# Patient Record
Sex: Female | Born: 1965 | Race: Black or African American | Hispanic: No | Marital: Single | Smoking: Current every day smoker
Health system: Southern US, Community
[De-identification: ages and names within clinical notes are randomized; demographics above are authoritative.]

## PROBLEM LIST (undated history)

## (undated) DIAGNOSIS — F419 Anxiety disorder, unspecified: Secondary | ICD-10-CM

## (undated) DIAGNOSIS — R269 Unspecified abnormalities of gait and mobility: Principal | ICD-10-CM

## (undated) DIAGNOSIS — F32A Depression, unspecified: Secondary | ICD-10-CM

## (undated) DIAGNOSIS — J40 Bronchitis, not specified as acute or chronic: Secondary | ICD-10-CM

## (undated) DIAGNOSIS — E785 Hyperlipidemia, unspecified: Secondary | ICD-10-CM

## (undated) DIAGNOSIS — I10 Essential (primary) hypertension: Secondary | ICD-10-CM

## (undated) DIAGNOSIS — F329 Major depressive disorder, single episode, unspecified: Secondary | ICD-10-CM

## (undated) DIAGNOSIS — K219 Gastro-esophageal reflux disease without esophagitis: Secondary | ICD-10-CM

## (undated) HISTORY — PX: TUBAL LIGATION: SHX77

## (undated) HISTORY — DX: Anxiety disorder, unspecified: F41.9

## (undated) HISTORY — DX: Unspecified abnormalities of gait and mobility: R26.9

## (undated) HISTORY — DX: Depression, unspecified: F32.A

## (undated) HISTORY — DX: Major depressive disorder, single episode, unspecified: F32.9

## (undated) HISTORY — DX: Hyperlipidemia, unspecified: E78.5

---

## 1997-05-15 ENCOUNTER — Other Ambulatory Visit: Admission: RE | Admit: 1997-05-15 | Discharge: 1997-05-15 | Payer: Self-pay | Admitting: Family Medicine

## 1998-06-11 ENCOUNTER — Inpatient Hospital Stay (HOSPITAL_COMMUNITY): Admission: AD | Admit: 1998-06-11 | Discharge: 1998-06-11 | Payer: Self-pay | Admitting: Obstetrics & Gynecology

## 1998-09-25 ENCOUNTER — Encounter (INDEPENDENT_AMBULATORY_CARE_PROVIDER_SITE_OTHER): Payer: Self-pay

## 1998-09-25 ENCOUNTER — Inpatient Hospital Stay (HOSPITAL_COMMUNITY): Admission: AD | Admit: 1998-09-25 | Discharge: 1998-09-29 | Payer: Self-pay | Admitting: Obstetrics

## 1998-09-25 ENCOUNTER — Encounter: Payer: Self-pay | Admitting: Obstetrics

## 1998-10-28 ENCOUNTER — Encounter: Admission: RE | Admit: 1998-10-28 | Discharge: 1998-10-28 | Payer: Self-pay | Admitting: Obstetrics

## 1998-11-04 ENCOUNTER — Encounter: Admission: RE | Admit: 1998-11-04 | Discharge: 1998-11-04 | Payer: Self-pay | Admitting: Obstetrics

## 1998-11-10 ENCOUNTER — Encounter: Admission: RE | Admit: 1998-11-10 | Discharge: 1998-11-10 | Payer: Self-pay | Admitting: Obstetrics & Gynecology

## 1998-11-24 ENCOUNTER — Encounter: Admission: RE | Admit: 1998-11-24 | Discharge: 1998-11-24 | Payer: Self-pay | Admitting: Obstetrics & Gynecology

## 1998-11-29 ENCOUNTER — Encounter: Admission: RE | Admit: 1998-11-29 | Discharge: 1998-11-29 | Payer: Self-pay | Admitting: Obstetrics & Gynecology

## 1998-12-08 ENCOUNTER — Ambulatory Visit (HOSPITAL_COMMUNITY): Admission: RE | Admit: 1998-12-08 | Discharge: 1998-12-08 | Payer: Self-pay | Admitting: Obstetrics

## 1998-12-09 ENCOUNTER — Encounter: Admission: RE | Admit: 1998-12-09 | Discharge: 1998-12-09 | Payer: Self-pay | Admitting: Obstetrics

## 1998-12-23 ENCOUNTER — Encounter (HOSPITAL_COMMUNITY): Admission: RE | Admit: 1998-12-23 | Discharge: 1999-01-19 | Payer: Self-pay | Admitting: Obstetrics

## 1998-12-23 ENCOUNTER — Encounter: Admission: RE | Admit: 1998-12-23 | Discharge: 1998-12-23 | Payer: Self-pay | Admitting: Obstetrics

## 1999-01-13 ENCOUNTER — Encounter: Admission: RE | Admit: 1999-01-13 | Discharge: 1999-01-13 | Payer: Self-pay | Admitting: Obstetrics

## 1999-01-17 ENCOUNTER — Inpatient Hospital Stay (HOSPITAL_COMMUNITY): Admission: AD | Admit: 1999-01-17 | Discharge: 1999-01-21 | Payer: Self-pay | Admitting: *Deleted

## 1999-01-17 ENCOUNTER — Encounter (INDEPENDENT_AMBULATORY_CARE_PROVIDER_SITE_OTHER): Payer: Self-pay

## 2003-06-11 ENCOUNTER — Other Ambulatory Visit: Admission: RE | Admit: 2003-06-11 | Discharge: 2003-06-11 | Payer: Self-pay | Admitting: Family Medicine

## 2003-06-15 ENCOUNTER — Encounter: Admission: RE | Admit: 2003-06-15 | Discharge: 2003-06-15 | Payer: Self-pay | Admitting: Family Medicine

## 2003-06-17 ENCOUNTER — Encounter: Admission: RE | Admit: 2003-06-17 | Discharge: 2003-06-17 | Payer: Self-pay | Admitting: Family Medicine

## 2003-10-05 ENCOUNTER — Encounter: Admission: RE | Admit: 2003-10-05 | Discharge: 2003-10-05 | Payer: Self-pay | Admitting: Family Medicine

## 2003-10-05 ENCOUNTER — Encounter (INDEPENDENT_AMBULATORY_CARE_PROVIDER_SITE_OTHER): Payer: Self-pay | Admitting: *Deleted

## 2003-10-14 ENCOUNTER — Encounter (HOSPITAL_COMMUNITY): Admission: RE | Admit: 2003-10-14 | Discharge: 2003-11-04 | Payer: Self-pay | Admitting: General Surgery

## 2003-11-02 ENCOUNTER — Ambulatory Visit (HOSPITAL_COMMUNITY): Admission: RE | Admit: 2003-11-02 | Discharge: 2003-11-02 | Payer: Self-pay | Admitting: General Surgery

## 2003-11-02 ENCOUNTER — Encounter: Admission: RE | Admit: 2003-11-02 | Discharge: 2003-11-02 | Payer: Self-pay | Admitting: General Surgery

## 2003-11-02 ENCOUNTER — Ambulatory Visit (HOSPITAL_BASED_OUTPATIENT_CLINIC_OR_DEPARTMENT_OTHER): Admission: RE | Admit: 2003-11-02 | Discharge: 2003-11-02 | Payer: Self-pay | Admitting: General Surgery

## 2003-11-02 ENCOUNTER — Encounter (INDEPENDENT_AMBULATORY_CARE_PROVIDER_SITE_OTHER): Payer: Self-pay | Admitting: *Deleted

## 2003-11-02 HISTORY — PX: BREAST EXCISIONAL BIOPSY: SUR124

## 2003-11-13 ENCOUNTER — Ambulatory Visit: Payer: Self-pay | Admitting: Family Medicine

## 2004-02-05 ENCOUNTER — Ambulatory Visit: Payer: Self-pay | Admitting: Family Medicine

## 2004-06-24 ENCOUNTER — Ambulatory Visit: Payer: Self-pay | Admitting: Family Medicine

## 2004-09-06 ENCOUNTER — Ambulatory Visit: Payer: Self-pay | Admitting: Family Medicine

## 2004-10-28 ENCOUNTER — Ambulatory Visit: Payer: Self-pay | Admitting: Family Medicine

## 2004-11-07 ENCOUNTER — Encounter: Admission: RE | Admit: 2004-11-07 | Discharge: 2004-11-07 | Payer: Self-pay | Admitting: Family Medicine

## 2004-11-23 ENCOUNTER — Ambulatory Visit: Payer: Self-pay | Admitting: Nurse Practitioner

## 2004-11-28 ENCOUNTER — Ambulatory Visit: Payer: Self-pay | Admitting: Family Medicine

## 2005-04-06 ENCOUNTER — Ambulatory Visit: Payer: Self-pay | Admitting: Family Medicine

## 2005-04-21 ENCOUNTER — Ambulatory Visit (HOSPITAL_COMMUNITY): Admission: RE | Admit: 2005-04-21 | Discharge: 2005-04-21 | Payer: Self-pay | Admitting: Family Medicine

## 2005-10-06 ENCOUNTER — Ambulatory Visit: Payer: Self-pay | Admitting: Family Medicine

## 2006-05-02 ENCOUNTER — Emergency Department (HOSPITAL_COMMUNITY): Admission: EM | Admit: 2006-05-02 | Discharge: 2006-05-02 | Payer: Self-pay | Admitting: Emergency Medicine

## 2006-05-25 ENCOUNTER — Ambulatory Visit: Payer: Self-pay | Admitting: Family Medicine

## 2006-05-25 ENCOUNTER — Ambulatory Visit (HOSPITAL_COMMUNITY): Admission: RE | Admit: 2006-05-25 | Discharge: 2006-05-25 | Payer: Self-pay | Admitting: Family Medicine

## 2006-09-14 ENCOUNTER — Ambulatory Visit: Payer: Self-pay | Admitting: Family Medicine

## 2006-11-30 DIAGNOSIS — M65849 Other synovitis and tenosynovitis, unspecified hand: Secondary | ICD-10-CM

## 2006-11-30 DIAGNOSIS — K219 Gastro-esophageal reflux disease without esophagitis: Secondary | ICD-10-CM

## 2006-11-30 DIAGNOSIS — D0590 Unspecified type of carcinoma in situ of unspecified breast: Secondary | ICD-10-CM

## 2006-11-30 DIAGNOSIS — F1410 Cocaine abuse, uncomplicated: Secondary | ICD-10-CM | POA: Insufficient documentation

## 2006-11-30 DIAGNOSIS — F17200 Nicotine dependence, unspecified, uncomplicated: Secondary | ICD-10-CM

## 2006-11-30 DIAGNOSIS — M65839 Other synovitis and tenosynovitis, unspecified forearm: Secondary | ICD-10-CM

## 2007-03-11 ENCOUNTER — Ambulatory Visit: Payer: Self-pay | Admitting: Family Medicine

## 2007-07-17 ENCOUNTER — Ambulatory Visit: Payer: Self-pay | Admitting: Internal Medicine

## 2007-08-27 ENCOUNTER — Encounter: Admission: RE | Admit: 2007-08-27 | Discharge: 2007-08-27 | Payer: Self-pay | Admitting: Family Medicine

## 2007-08-31 ENCOUNTER — Emergency Department (HOSPITAL_COMMUNITY): Admission: EM | Admit: 2007-08-31 | Discharge: 2007-09-01 | Payer: Self-pay | Admitting: Emergency Medicine

## 2007-11-22 ENCOUNTER — Ambulatory Visit (HOSPITAL_COMMUNITY): Admission: RE | Admit: 2007-11-22 | Discharge: 2007-11-22 | Payer: Self-pay | Admitting: Family Medicine

## 2007-11-22 ENCOUNTER — Ambulatory Visit: Payer: Self-pay | Admitting: Family Medicine

## 2008-02-14 ENCOUNTER — Ambulatory Visit: Payer: Self-pay | Admitting: Family Medicine

## 2008-06-01 ENCOUNTER — Emergency Department (HOSPITAL_COMMUNITY): Admission: EM | Admit: 2008-06-01 | Discharge: 2008-06-01 | Payer: Self-pay | Admitting: Emergency Medicine

## 2009-10-06 ENCOUNTER — Ambulatory Visit: Payer: Self-pay | Admitting: Family Medicine

## 2009-10-06 ENCOUNTER — Encounter (INDEPENDENT_AMBULATORY_CARE_PROVIDER_SITE_OTHER): Payer: Self-pay | Admitting: Family Medicine

## 2009-10-06 LAB — CONVERTED CEMR LAB
Basophils Relative: 0 % (ref 0–1)
HCT: 41.7 % (ref 36.0–46.0)
MCV: 76.1 fL — ABNORMAL LOW (ref 78.0–100.0)
Neutro Abs: 5.3 10*3/uL (ref 1.7–7.7)
Neutrophils Relative %: 65 % (ref 43–77)
Platelets: 298 10*3/uL (ref 150–400)

## 2010-02-04 ENCOUNTER — Emergency Department (HOSPITAL_COMMUNITY)
Admission: EM | Admit: 2010-02-04 | Discharge: 2010-02-04 | Payer: Self-pay | Source: Home / Self Care | Admitting: Family Medicine

## 2010-06-24 NOTE — Op Note (Signed)
[facility]  Patient:    Laura Huerta                    MRN: [id number] Proc. Date: 01/19/99 Adm. Date:  [id number] Attending:  Antionette Char Dictator:   Bernette Redbird, M.D. CC:         Conni Elliot, M.D.             Bernette Redbird, M.D.                           Operative Report  HOSPITAL ID NUMBER:           [id number]  PREOPERATIVE DIAGNOSES:       1. A 40-week intrauterine pregnancy.                               2. Arrest of dilatation.                               3. Failure to descend.                               4. Repetitive severe variables.                               5. Multiparity, desires permanent sterilization.  POSTOPERATIVE DIAGNOSES:      1. A 40-week intrauterine pregnancy.                               2. Arrest of dilatation.                               3. Failure to descend.                               4. Repetitive severe variables.                               5. Multiparity, desires permanent sterilization.  OPERATIONS:                   1. Primary low transverse cesarean section via  Pfannenstiel.                               2. Postpartum tubal ligation, Pomeroy method.  SURGEON:                      Conni Elliot, M.D.  ASSISTANT:                    Bernette Redbird, M.D.  ANESTHESIA:                   Epidural.  COMPLICATIONS:                None.  ESTIMATED BLOOD LOSS:         800 cc.  FLUIDS:  2300 cc, crystalloid.  URINE OUTPUT:                 400 cc of clear urine.  INDICATIONS:                  The patient is a 45 year old, gravida 5, para 1-0-5-1 at [redacted] weeks gestation, induced for oligohydramnios, IUGR, repetitive severe variables with oxytocin, maximum dilatation 9 cm with failure to descend.  FINDINGS:                     Female infant in cephalic presentation.  DeLee on the abdomen.  Pediatrics present at delivery.  Apgars 4 and 8.  Cord  gas 7.26. Normal uterus, tubes, and ovaries.  DESCRIPTION OF PROCEDURE:     The patient was taken to the operating room where  epidural anesthesia was found to be adequate.  She was then prepared and draped in a normal sterile fashion in a dorsal supine position with a leftward tilt.  A Pfannenstiel skin incision was then made with a scalpel and carried through to he underlying layer of the fascia with a Bovie.  The fascia was nicked in the midline and the incision extended laterally with the Mayo scissors.  The superior aspect of the fascial incision was then grasped with the Kocher clamps, elevated, and the  underlying rectus muscles dissected off bluntly.  Attention was then turned to he inferior aspect of this incision, which, in a similar fashion was grasped, tinted up with the Kocher clamps, and the rectus muscles dissected off bluntly.  The rectus muscles were then separated in the midline and the peritoneum identified, tinted up, and entered sharply with the Metzenbaum scissors.  The peritoneal incision was then extended superiorly and inferiorly with good visualization of the bladder.  The bladder blade was then inserted and the vesicouterine peritoneum identified, grasped with the pickups and entered sharply with the Metzenbaum scissors.  This incision was then extended laterally and a bladder created digitally.  The bladder blade was then reinserted, the lower uterine segment incised in a transverse fashion with the scalpel.  The uterine incision was then extended laterally with the bandage scissors.  The bladder blade was removed and the infants head delivered atraumatically.  The nose and mouth were suctioned with a DeLee suction tract, and the cord clamped and cut.  The infant was handed off to the awaiting pediatricians.  Cord gases were sent and returned at 7.26.  The placenta was then removed manually, the uterus exteriorized and cleared of ll clots and  debris.  The uterine incision was then repaired with 1-0 chromic in a  running lock fashion.  The patients left fallopian tube was then identified and  grasped with a Babcock clamp and the tube was then followed out to the fimbria.  The Babcock clamp was then used to grasp the tube approximately 4 cm from the corneal region.  A 3 cm segment of the tube was then ligated with a free tie of 2-0 chromic and excised.  Good hemostasis was noted.  The right fallopian tube was hen ligated, and 3 cm segment excised in a similar fashion.  Excellent hemostasis was noted.  The bladder was then filled with sterile milk and noted to be normal. The bladder flap was repaired with 2-0 chromic in a running stitch and then the uterus was returned to the abdomen. The gutter were cleared of all clots, and the peritoneum closed with 2-0 chromic.  The fascia was reapproximated with 0 Vicryl in a running fashion.  The skin was closed with staples.  The patient tolerated the procedure well.  Sponge, lap, and needle counts were correct x 2.  Then, 1 g of cefotetan was given at cord clamp, and will be repeated q.12h. x 24 hours.  The patient was taken to the recovery room in stable condition. DD:  01/19/99 TD:  01/20/99 Job: [id number]

## 2010-06-24 NOTE — Op Note (Signed)
Laura Huerta, Laura Huerta          ACCOUNT NO.:  [id number]   MEDICAL RECORD NO.:  [id number]          PATIENT TYPE:  AMB   LOCATION:  DSC                          FACILITY:  [facility]   PHYSICIAN:  Rose Phi. Maple Hudson, M.D.   DATE OF BIRTH:  April 26, 1965   DATE OF PROCEDURE:  DATE OF DISCHARGE:                                 OPERATIVE REPORT   PREOPERATIVE DIAGNOSIS:  Granular cell tumor of left breast.   POSTOPERATIVE DIAGNOSIS:  Granular cell tumor of left breast.   OPERATION PERFORMED:  Excision of granular cell tumor of left breast.   SURGEON:  Rose Phi. Maple Hudson, M.D.   ANESTHESIA:  MAC.   DESCRIPTION OF PROCEDURE:  The patient was placed on the operating table and  the left breast was prepped and draped in the usual fashion.  Using the  previously placed localization wire as a guide, a curved incision centered  in the upper part of the left breast was outlined.  The area then thoroughly  infiltrated with a local anesthetic mixture.  An incision was then made and  a wide excision of the wire and surrounding tissue was carried out.  Specimen mammography confirmed the removal of the lesion.  Hemostasis was  obtained with the cautery.  Two layer closure of 3-0 Vicryl and subcuticular  4-0  Monocryl with skin glue was then carried out and a dressing applied.  The patient was then transferred to the recovery room in satisfactory  condition having tolerated the procedure well.      Pete   PRY/MEDQ  D:  11/02/2003  T:  11/02/2003  Job:  [id number]

## 2010-10-24 ENCOUNTER — Inpatient Hospital Stay (INDEPENDENT_AMBULATORY_CARE_PROVIDER_SITE_OTHER)
Admission: RE | Admit: 2010-10-24 | Discharge: 2010-10-24 | Disposition: A | Discharge status: Home / Self Care | Payer: Self-pay | Source: Ambulatory Visit | Attending: Emergency Medicine | Admitting: Emergency Medicine

## 2010-10-24 DIAGNOSIS — N760 Acute vaginitis: Secondary | ICD-10-CM

## 2010-10-24 LAB — WET PREP, GENITAL
Trich, Wet Prep: NONE SEEN
Yeast Wet Prep HPF POC: NONE SEEN

## 2010-10-24 LAB — POCT URINALYSIS DIP (DEVICE)
Bilirubin Urine: NEGATIVE
Glucose, UA: NEGATIVE mg/dL
Ketones, ur: NEGATIVE mg/dL
Specific Gravity, Urine: 1.025 (ref 1.005–1.030)
pH: 6.5 (ref 5.0–8.0)

## 2010-10-25 LAB — GC/CHLAMYDIA PROBE AMP, GENITAL: GC Probe Amp, Genital: NEGATIVE

## 2010-11-04 LAB — COMPREHENSIVE METABOLIC PANEL
Albumin: 3.6
Alkaline Phosphatase: 57
CO2: 27
Calcium: 9.5
Creatinine, Ser: 0.74
GFR calc Af Amer: >60

## 2010-11-04 LAB — POCT PREGNANCY, URINE
Operator id: 282151
Preg Test, Ur: NEGATIVE

## 2010-11-04 LAB — POCT URINALYSIS DIP (DEVICE)
Operator id: 282151
Protein, ur: NEGATIVE
Urobilinogen, UA: 1
pH: 6

## 2011-04-06 ENCOUNTER — Encounter (HOSPITAL_COMMUNITY): Payer: Self-pay | Admitting: Emergency Medicine

## 2011-04-06 ENCOUNTER — Emergency Department (HOSPITAL_COMMUNITY)
Admission: EM | Admit: 2011-04-06 | Discharge: 2011-04-06 | Discharge status: Against Medical Advice | Payer: Medicaid Other | Attending: Emergency Medicine | Admitting: Emergency Medicine

## 2011-04-06 ENCOUNTER — Encounter (HOSPITAL_COMMUNITY): Payer: Self-pay | Admitting: *Deleted

## 2011-04-06 ENCOUNTER — Emergency Department (HOSPITAL_COMMUNITY)
Admission: EM | Admit: 2011-04-06 | Discharge: 2011-04-06 | Discharge status: Against Medical Advice | Payer: Medicaid Other | Source: Home / Self Care

## 2011-04-06 DIAGNOSIS — R109 Unspecified abdominal pain: Secondary | ICD-10-CM | POA: Insufficient documentation

## 2011-04-06 HISTORY — DX: Bronchitis, not specified as acute or chronic: J40

## 2011-04-06 HISTORY — DX: Gastro-esophageal reflux disease without esophagitis: K21.9

## 2011-04-06 NOTE — ED Notes (Signed)
Pt in c/o lower abd pain since yesterday with nausea, denies vomiting, states she recently started a medication that interacts with her GERD medication

## 2011-04-06 NOTE — ED Notes (Signed)
C/o lower abd pain since yesterday.  States her PCP started her on Zithromax yesterday for bronchitis.  Pt states she takes Omeprazole and just realized she was not suppose to take Zithromax with Omeprazole.  Reports nausea.

## 2011-04-27 ENCOUNTER — Emergency Department (INDEPENDENT_AMBULATORY_CARE_PROVIDER_SITE_OTHER): Payer: Medicaid Other

## 2011-04-27 ENCOUNTER — Encounter (HOSPITAL_COMMUNITY): Payer: Self-pay | Admitting: Emergency Medicine

## 2011-04-27 ENCOUNTER — Emergency Department (INDEPENDENT_AMBULATORY_CARE_PROVIDER_SITE_OTHER)
Admission: EM | Admit: 2011-04-27 | Discharge: 2011-04-27 | Disposition: A | Discharge status: Home / Self Care | Payer: Self-pay | Source: Home / Self Care | Attending: Family Medicine | Admitting: Family Medicine

## 2011-04-27 DIAGNOSIS — J410 Simple chronic bronchitis: Secondary | ICD-10-CM

## 2011-04-27 DIAGNOSIS — Z720 Tobacco use: Secondary | ICD-10-CM

## 2011-04-27 LAB — POCT URINALYSIS DIP (DEVICE)
Bilirubin Urine: NEGATIVE
Hgb urine dipstick: NEGATIVE
Urobilinogen, UA: 2 mg/dL — ABNORMAL HIGH (ref 0.0–1.0)
pH: 8.5 — ABNORMAL HIGH (ref 5.0–8.0)

## 2011-04-27 MED ORDER — SULFAMETHOXAZOLE-TRIMETHOPRIM 800-160 MG PO TABS: 1.0000 | ORAL_TABLET | Freq: Two times a day (BID) | ORAL | Status: AC

## 2011-04-27 MED ORDER — GUAIFENESIN-CODEINE 100-10 MG/5ML PO SYRP: 10.0000 mL | ORAL_SOLUTION | Freq: Three times a day (TID) | ORAL | Status: AC | PRN

## 2011-04-27 NOTE — ED Notes (Addendum)
Pt HERE WITH COLD/URI SX COUGH WITH YELLOW PHLEGM,RUNNY NOSE THAT STARTED X 1 WEEK AGO S/P FLU LIKE SX.DENIES N./V/FEVERS.PT HAS A BAG FULL OF OTC MEDS SHE'S BEEN TAKING BUT NO RELIEF.PT ALSO C/O VAG IRRITATION,BURN AND ITCHING X 3 DYS AGO.DENIES VAG D/C OR ODOR.LMP 03/23/11.PT HAS HX BRONCHITIS

## 2011-04-27 NOTE — ED Provider Notes (Signed)
History     CSN: [id number]  Arrival date & time 04/27/11  1525   First MD Initiated Contact with Patient 04/27/11 1537      Chief Complaint  Patient presents with  . URI  . Vaginal Itching    (Consider location/radiation/quality/duration/timing/severity/associated sxs/prior treatment) Patient is a 46 y.o. female presenting with URI and vaginal itching. The history is provided by the patient.  URI The primary symptoms include cough. The current episode started 3 to 5 days ago. This is a new problem. The problem has been gradually worsening.  Symptoms associated with the illness include congestion. Risk factors: smokes 2ppd.  Vaginal Itching    Past Medical History  Diagnosis Date  . Acid reflux   . Bronchitis     History reviewed. No pertinent past surgical history.  No family history on file.  History  Substance Use Topics  . Smoking status: Current Everyday Smoker  . Smokeless tobacco: Not on file  . Alcohol Use: Yes    OB History    Grav Para Term Preterm Abortions TAB SAB Ect Mult Living                  Review of Systems  Constitutional: Negative.   HENT: Positive for congestion.   Respiratory: Positive for cough.   Genitourinary: Positive for frequency.    Allergies  Review of patient's allergies indicates no known allergies.  Home Medications   Current Outpatient Rx  Name Route Sig Dispense Refill  . BUPROPION HCL ER (XL) 150 MG PO TB24 Oral Take 150 mg by mouth daily.    Marland Kitchen OMEPRAZOLE 40 MG PO CPDR Oral Take 40 mg by mouth daily.    . GUAIFENESIN-CODEINE 100-10 MG/5ML PO SYRP Oral Take 10 mLs by mouth 3 (three) times daily as needed for cough. 180 mL 0  . SULFAMETHOXAZOLE-TRIMETHOPRIM 800-160 MG PO TABS Oral Take 1 tablet by mouth 2 (two) times daily. 20 tablet 0    BP 179/80  Pulse 80  Temp(Src) 97.5 F (36.4 C) (Oral)  Resp 16  SpO2 99%  LMP 03/23/2011  Physical Exam  Nursing note and vitals reviewed. Constitutional: She is  oriented to person, place, and time. She appears well-developed and well-nourished. No distress.  HENT:  Head: Normocephalic.  Right Ear: External ear normal.  Left Ear: External ear normal.  Mouth/Throat: Oropharynx is clear and moist. Abnormal dentition. Dental caries present.  Eyes: Conjunctivae are normal. Pupils are equal, round, and reactive to light.  Neck: Normal range of motion. Neck supple.       Node to right neck   Cardiovascular: Normal rate and normal heart sounds.   Pulmonary/Chest: She has rhonchi.  Abdominal: Bowel sounds are normal. There is no tenderness.  Lymphadenopathy:    She has cervical adenopathy.  Neurological: She is alert and oriented to person, place, and time.  Skin: Skin is warm and dry.    ED Course  Procedures (including critical care time)  Labs Reviewed  POCT URINALYSIS DIP (DEVICE) - Abnormal; Notable for the following:    pH 8.5 (*)    Urobilinogen, UA 2.0 (*)    All other components within normal limits  POCT PREGNANCY, URINE   Dg Chest 2 View  04/27/2011  *RADIOLOGY REPORT*  Clinical Data: Smoker with cough for 1 year.  CHEST - 2 VIEW  Comparison: 11/22/2007 radiographs.  Findings: The heart size and mediastinal contours are stable.  The lungs are clear.  There is probable apical bleb formation,  best seen on the lateral view, unchanged.  No pleural effusion or pneumothorax is seen.  The osseous structures appear unremarkable.  IMPRESSION: Stable examination.  No acute cardiopulmonary process demonstrated.  Original Report Authenticated By: Gerrianne Scale, M.D.     1. Bronchitis due to tobacco use       MDM  X-rays reviewed and report per radiologist.         Linna Hoff, MD 04/27/11 [phone]

## 2011-05-13 ENCOUNTER — Encounter (HOSPITAL_COMMUNITY): Payer: Self-pay | Admitting: *Deleted

## 2011-05-13 ENCOUNTER — Emergency Department (INDEPENDENT_AMBULATORY_CARE_PROVIDER_SITE_OTHER)
Admission: EM | Admit: 2011-05-13 | Discharge: 2011-05-13 | Disposition: A | Discharge status: Home / Self Care | Payer: Medicaid Other | Source: Home / Self Care | Attending: Emergency Medicine | Admitting: Emergency Medicine

## 2011-05-13 DIAGNOSIS — N926 Irregular menstruation, unspecified: Secondary | ICD-10-CM

## 2011-05-13 DIAGNOSIS — K625 Hemorrhage of anus and rectum: Secondary | ICD-10-CM

## 2011-05-13 DIAGNOSIS — N939 Abnormal uterine and vaginal bleeding, unspecified: Secondary | ICD-10-CM

## 2011-05-13 LAB — POCT PREGNANCY, URINE: Preg Test, Ur: NEGATIVE

## 2011-05-13 NOTE — ED Provider Notes (Signed)
History     CSN: [id number]  Arrival date & time 05/13/11  1700   First MD Initiated Contact with Patient 05/13/11 1706      Chief Complaint  Patient presents with  . Rectal Bleeding    (Consider location/radiation/quality/duration/timing/severity/associated sxs/prior treatment) HPI Comments: Patient presents urgent care today complaining of 2 symptoms both a ongoing recurrent rectal bleeding that has been occurring for several months that have been coming more frequent within the last 2 weeks. This bleeding is not associated with any pain, rectal swelling, or palpable hemorrhoids.  Patient also describes that she just got her period and it was before her normal scheduled expected time and this has happened a couple times her.  Patient has been seen at [id number]  considers this clinic for medical. Patient denies abdominal pain weight loss fevers. Denies having had any evaluations for the symptoms of any other providers  Patient is a 46 y.o. female presenting with hematochezia. The history is provided by the patient.  Rectal Bleeding  The current episode started more than 2 weeks ago. The onset was gradual. The problem occurs occasionally. The problem has been unchanged. The patient is experiencing no pain. The stool is described as mixed with blood. There was no prior unsuccessful therapy. Associated symptoms include vaginal bleeding. Pertinent negatives include no anorexia, no fever, no abdominal pain, no diarrhea, no hematemesis, no hemorrhoids, no nausea, no rectal pain, no vomiting, no hematuria, no vaginal discharge, no chest pain, no coughing, no difficulty breathing and no rash.    Past Medical History  Diagnosis Date  . Acid reflux   . Bronchitis     History reviewed. No pertinent past surgical history.  History reviewed. No pertinent family history.  History  Substance Use Topics  . Smoking status: Current Everyday Smoker  . Smokeless tobacco: Not on file  . Alcohol  Use: Yes    OB History    Grav Para Term Preterm Abortions TAB SAB Ect Mult Living                  Review of Systems  Constitutional: Negative for fever, chills, activity change and appetite change.  Respiratory: Negative for cough.   Cardiovascular: Negative for chest pain.  Gastrointestinal: Positive for hematochezia and anal bleeding. Negative for nausea, vomiting, abdominal pain, diarrhea, rectal pain, anorexia, hematemesis and hemorrhoids.  Genitourinary: Positive for vaginal bleeding. Negative for hematuria and vaginal discharge.  Skin: Negative for rash.  Neurological: Negative for dizziness and light-headedness.    Allergies  Review of patient's allergies indicates no known allergies.  Home Medications   Current Outpatient Rx  Name Route Sig Dispense Refill  . OMEPRAZOLE 40 MG PO CPDR Oral Take 40 mg by mouth daily.    Marland Kitchen PHAZYME PO Oral Take by mouth.    . BUPROPION HCL ER (XL) 150 MG PO TB24 Oral Take 150 mg by mouth daily.      BP 144/80  Pulse 78  Temp(Src) 98.5 F (36.9 C) (Oral)  Resp 17  SpO2 99%  LMP 05/11/2011  Physical Exam  Nursing note and vitals reviewed. Constitutional: She appears well-developed and well-nourished.  Abdominal: Soft. She exhibits no shifting dullness. There is hepatomegaly. There is no hepatosplenomegaly. There is no tenderness. There is no rigidity, no guarding, no tenderness at McBurney's point and negative Murphy's sign. No hernia.    Genitourinary:    There is no tenderness on the right labia. There is no tenderness on the left labia. There is  bleeding around the vagina.    ED Course  Procedures (including critical care time)  Labs Reviewed  WET PREP, GENITAL - Abnormal; Notable for the following:    Clue Cells Wet Prep HPF POC FEW (*)    WBC, Wet Prep HPF POC RARE (*)    All other components within normal limits  OCCULT BLOOD, POC DEVICE  POCT PREGNANCY, URINE  GC/CHLAMYDIA PROBE AMP, GENITAL   No results  found.   1. Rectal bleeding   2. Abnormal menstrual cycle       MDM  Patient presented today with 2 different complaints #1 is an ongoing intermittent and recurrent rectal bleeding she has had for several months has been more frequent within the last 2 weeks and she seen dark looking stools and blood in the toilet. She expresses no pain or this rectal discomfort associated with it. Has not felt any hemorrhoids. She has had similar symptoms for months at a time and has mentioned this to a provider at [facility] as per patient report. She also presents with a recently on schedule menstrual period. Which she describes as before time denies any pelvic pain weight loss fevers or any other symptoms        Jimmie Molly, MD 05/13/11 2042

## 2011-05-13 NOTE — Discharge Instructions (Signed)
As discussed you need to follow both with the gynecologist and also the gastroenterologist. Other many reasons to have rectal and vaginal bleeding there is a workup that needs to be done to rule out rectal cancer or/and endometrial carcinoma which or both malignant conditions that can be screened with  proper imaging and evaluation. I would also suggest you call your doctor at [facility] to assist during this diagnostic process    Abnormal Uterine Bleeding Abnormal uterine bleeding can have many causes. Some cases are simply treated, while others are more serious. There are several kinds of bleeding that is considered abnormal, including:  Bleeding between periods.   Bleeding after sexual intercourse.   Spotting anytime in the menstrual cycle.   Bleeding heavier or more than normal.   Bleeding after menopause.  CAUSES  There are many causes of abnormal uterine bleeding. It can be present in teenagers, pregnant women, women during their reproductive years, and women who have reached menopause. Your caregiver will look for the more common causes depending on your age, signs, symptoms and your particular circumstance. Most cases are not serious and can be treated. Even the more serious causes, like cancer of the female organs, can be treated adequately if found in the early stages. That is why all types of bleeding should be evaluated and treated as soon as possible. DIAGNOSIS  Diagnosing the cause may take several kinds of tests. Your caregiver may:  Take a complete history of the type of bleeding.   Perform a complete physical exam and Pap smear.   Take an ultrasound on the abdomen showing a picture of the female organs and the pelvis.   Inject dye into the uterus and Fallopian tubes and X-ray them (hysterosalpingogram).   Place fluid in the uterus and do an ultrasound (sonohysterogrqphy).   Take a CT scan to examine the female organs and pelvis.   Take an MRI to examine the female  organs and pelvis. There is no X-ray involved with this procedure.   Look inside the uterus with a telescope that has a light at the end (hysteroscopy).   Scrap the inside of the uterus to get tissue to examine (Dilatation and Curettage, D&C).   Look into the pelvis with a telescope that has a light at the end (laparoscopy). This is done through a very small cut (incision) in the abdomen.  TREATMENT  Treatment will depend on the cause of the abnormal bleeding. It can include:  Doing nothing to allow the problem to take care of itself over time.   Hormone treatment.   Birth control pills.   Treating the medical condition causing the problem.   Laparoscopy.   Major or minor surgery   Destroying the lining of the uterus with electrical currant, laser, freezing or heat (uterine ablation).  HOME CARE INSTRUCTIONS   Follow your caregiver's recommendation on how to treat your problem.   See your caregiver if you missed a menstrual period and think you may be pregnant.   If you are bleeding heavily, count the number of pads/tampons you use and how often you have to change them. Tell this to your caregiver.   Avoid sexual intercourse until the problem is controlled.  SEEK MEDICAL CARE IF:   You have any kind of abnormal bleeding mentioned above.   You feel dizzy at times.   You are 46 years old and have not had a menstrual period yet.  SEEK IMMEDIATE MEDICAL CARE IF:   You pass out.  You are changing pads/tampons every 15 to 30 minutes.   You have belly (abdominal) pain.   You have a temperature of 100 F (37.8 C) or higher.   You become sweaty or weak.   You are passing large blood clots from the vagina.   You start to feel sick to your stomach (nauseous) and throw up (vomit).  Document Released: 01/23/2005 Document Revised: 01/12/2011 Document Reviewed: 06/18/2008 [facility] Patient Information 2012 [place], [place].

## 2011-05-13 NOTE — ED Notes (Signed)
Rectal bleeding onset Thursday - dark red blood toilet with each bm - denies rectal pain or itching -denies hemorrhoids -

## 2011-05-15 LAB — GC/CHLAMYDIA PROBE AMP, GENITAL
Chlamydia, DNA Probe: NEGATIVE
GC Probe Amp, Genital: NEGATIVE

## 2011-08-09 ENCOUNTER — Encounter: Payer: Self-pay | Admitting: Obstetrics & Gynecology

## 2011-08-09 ENCOUNTER — Ambulatory Visit (INDEPENDENT_AMBULATORY_CARE_PROVIDER_SITE_OTHER): Payer: Medicaid Other | Admitting: Obstetrics & Gynecology

## 2011-08-09 VITALS — BP 151/103 | HR 76 | Temp 97.6°F | Ht 63.25 in | Wt 142.2 lb

## 2011-08-09 DIAGNOSIS — Z1231 Encounter for screening mammogram for malignant neoplasm of breast: Secondary | ICD-10-CM

## 2011-08-09 DIAGNOSIS — A499 Bacterial infection, unspecified: Secondary | ICD-10-CM

## 2011-08-09 DIAGNOSIS — N951 Menopausal and female climacteric states: Secondary | ICD-10-CM

## 2011-08-09 DIAGNOSIS — N9089 Other specified noninflammatory disorders of vulva and perineum: Secondary | ICD-10-CM

## 2011-08-09 DIAGNOSIS — N760 Acute vaginitis: Secondary | ICD-10-CM

## 2011-08-09 DIAGNOSIS — B9689 Other specified bacterial agents as the cause of diseases classified elsewhere: Secondary | ICD-10-CM

## 2011-08-09 LAB — WET PREP, GENITAL: Yeast Wet Prep HPF POC: NONE SEEN

## 2011-08-09 MED ORDER — VENLAFAXINE HCL ER 75 MG PO CP24: 75.0000 mg | ORAL_CAPSULE | Freq: Every day | ORAL | Status: DC

## 2011-08-09 NOTE — Progress Notes (Signed)
  Subjective:     Laura Huerta is a 46 y.o. woman who presents for amenorrhea x 3 months, debilitating hot flashes and night sweats.  Also reports vulvar irritation off/on with some clear discharge. No other GYN symptoms.  The following portions of the patient's history were reviewed and updated as appropriate: allergies, current medications, past family history, past medical history, past social history, past surgical history and problem list.  Review of Systems Pertinent items are noted in HPI.     Objective:    General appearance: alert and no distress Abdomen: soft, non-tender; bowel sounds normal; no masses,  no organomegaly Pelvic: cervix normal in appearance, external genitalia normal, no adnexal masses or tenderness, no cervical motion tenderness, uterus normal size, shape, and consistency and small amount of white vaginal discharge, wet prep obtained Extremities: extremities normal, atraumatic, no cyanosis or edema and Homans sign is negative, no sign of DVT    Assessment:    The patient has perimenopausal symptoms and vulvar irritation.    Plan:    Follow up wet prep and manage accordingly  Discussed management of perimenopausal symptoms; discussed Effexor. Not an estrogen candidate given uncontrolled HTN (will follow up with PCP). Effexor prescribed. Mammogram scheduled, routine preventative health maintenance measures emphasized. Will do annual exam and pap smear next visit.

## 2011-08-09 NOTE — Patient Instructions (Signed)
Abnormal Uterine Bleeding Abnormal uterine bleeding can have many causes. Some cases are simply treated, while others are more serious. There are several kinds of bleeding that is considered abnormal, including:  Bleeding between periods.   Bleeding after sexual intercourse.   Spotting anytime in the menstrual cycle.   Bleeding heavier or more than normal.   Bleeding after menopause.  CAUSES  There are many causes of abnormal uterine bleeding. It can be present in teenagers, pregnant women, women during their reproductive years, and women who have reached menopause. Your caregiver will look for the more common causes depending on your age, signs, symptoms and your particular circumstance. Most cases are not serious and can be treated. Even the more serious causes, like cancer of the female organs, can be treated adequately if found in the early stages. That is why all types of bleeding should be evaluated and treated as soon as possible. DIAGNOSIS  Diagnosing the cause may take several kinds of tests. Your caregiver may:  Take a complete history of the type of bleeding.   Perform a complete physical exam and Pap smear.   Take an ultrasound on the abdomen showing a picture of the female organs and the pelvis.   Inject dye into the uterus and Fallopian tubes and X-ray them (hysterosalpingogram).   Place fluid in the uterus and do an ultrasound (sonohysterogrqphy).   Take a CT scan to examine the female organs and pelvis.   Take an MRI to examine the female organs and pelvis. There is no X-ray involved with this procedure.   Look inside the uterus with a telescope that has a light at the end (hysteroscopy).   Scrap the inside of the uterus to get tissue to examine (Dilatation and Curettage, D&C).   Look into the pelvis with a telescope that has a light at the end (laparoscopy). This is done through a very small cut (incision) in the abdomen.  TREATMENT  Treatment will depend on the  cause of the abnormal bleeding. It can include:  Doing nothing to allow the problem to take care of itself over time.   Hormone treatment.   Birth control pills.   Treating the medical condition causing the problem.   Laparoscopy.   Major or minor surgery   Destroying the lining of the uterus with electrical currant, laser, freezing or heat (uterine ablation).  HOME CARE INSTRUCTIONS   Follow your caregiver's recommendation on how to treat your problem.   See your caregiver if you missed a menstrual period and think you may be pregnant.   If you are bleeding heavily, count the number of pads/tampons you use and how often you have to change them. Tell this to your caregiver.   Avoid sexual intercourse until the problem is controlled.  SEEK MEDICAL CARE IF:   You have any kind of abnormal bleeding mentioned above.   You feel dizzy at times.   You are 46 years old and have not had a menstrual period yet.  SEEK IMMEDIATE MEDICAL CARE IF:   You pass out.   You are changing pads/tampons every 15 to 30 minutes.   You have belly (abdominal) pain.   You have a temperature of 100 F (37.8 C) or higher.   You become sweaty or weak.   You are passing large blood clots from the vagina.   You start to feel sick to your stomach (nauseous) and throw up (vomit).  Document Released: 01/23/2005 Document Revised: 01/12/2011 Document Reviewed: 06/18/2008 [employer]   Patient Information 2012 [employer]. 

## 2011-08-11 MED ORDER — TINIDAZOLE 500 MG PO TABS: 2.0000 g | ORAL_TABLET | Freq: Every day | ORAL | Status: AC

## 2011-08-11 NOTE — Addendum Note (Signed)
Addended by: Jaynie Collins A on: 08/11/2011 08:54 AM   Modules accepted: Orders

## 2011-08-11 NOTE — Progress Notes (Addendum)
Wet prep showed clue cells, Tinidazole e-prescribed. Patient called to inform her of diagnosis and treatment, and to go pick up the prescription. She was also told to call the clinic with any further questions or further concerns.

## 2011-08-31 ENCOUNTER — Ambulatory Visit (HOSPITAL_COMMUNITY)
Admission: RE | Admit: 2011-08-31 | Discharge: 2011-08-31 | Disposition: A | Discharge status: Home / Self Care | Payer: Medicaid Other | Source: Ambulatory Visit | Attending: Obstetrics & Gynecology | Admitting: Obstetrics & Gynecology

## 2011-08-31 DIAGNOSIS — Z1231 Encounter for screening mammogram for malignant neoplasm of breast: Secondary | ICD-10-CM | POA: Insufficient documentation

## 2011-09-06 ENCOUNTER — Other Ambulatory Visit (HOSPITAL_COMMUNITY)
Admission: RE | Admit: 2011-09-06 | Discharge: 2011-09-06 | Disposition: A | Discharge status: Home / Self Care | Payer: Medicaid Other | Source: Ambulatory Visit | Attending: Obstetrics & Gynecology | Admitting: Obstetrics & Gynecology

## 2011-09-06 ENCOUNTER — Ambulatory Visit (INDEPENDENT_AMBULATORY_CARE_PROVIDER_SITE_OTHER): Payer: Medicaid Other | Admitting: Obstetrics & Gynecology

## 2011-09-06 ENCOUNTER — Encounter: Payer: Self-pay | Admitting: Obstetrics & Gynecology

## 2011-09-06 VITALS — BP 149/95 | HR 93 | Temp 97.8°F | Resp 12 | Ht 63.0 in | Wt 143.1 lb

## 2011-09-06 DIAGNOSIS — A499 Bacterial infection, unspecified: Secondary | ICD-10-CM

## 2011-09-06 DIAGNOSIS — Z01419 Encounter for gynecological examination (general) (routine) without abnormal findings: Secondary | ICD-10-CM

## 2011-09-06 DIAGNOSIS — B9689 Other specified bacterial agents as the cause of diseases classified elsewhere: Secondary | ICD-10-CM

## 2011-09-06 DIAGNOSIS — Z1151 Encounter for screening for human papillomavirus (HPV): Secondary | ICD-10-CM | POA: Insufficient documentation

## 2011-09-06 DIAGNOSIS — N760 Acute vaginitis: Secondary | ICD-10-CM

## 2011-09-06 DIAGNOSIS — Z0000 Encounter for general adult medical examination without abnormal findings: Secondary | ICD-10-CM

## 2011-09-06 MED ORDER — TINIDAZOLE 500 MG PO TABS: 2.0000 g | ORAL_TABLET | Freq: Every day | ORAL | Status: AC

## 2011-09-06 NOTE — Progress Notes (Signed)
  Subjective:     Laura Huerta is a 46 y.o. [id number] perimenopausal female here for a comprehensive gynecologic physical exam. The patient reports no problems. Was treated for BV and vasomotor symptoms last visit, normal mammogram on 09/04/11.  History   Social History  . Marital Status: Single    Spouse Name: N/A    Number of Children: N/A  . Years of Education: N/A   Occupational History  . Not on file.   Social History Main Topics  . Smoking status: Current Everyday Smoker -- 2.0 packs/day for 10 years    Types: Cigarettes  . Smokeless tobacco: Never Used  . Alcohol Use: 1.2 oz/week    2 Cans of beer per week  . Drug Use: No  . Sexually Active: Yes    Birth Control/ Protection: Surgical   Other Topics Concern  . Not on file   Social History Narrative  . No narrative on file   Health Maintenance  Topic Date Due  . Pap Smear  10/20/1981  . Tetanus/tdap  10/21/1982  . Influenza Vaccine  11/07/2011    The following portions of the patient's history were reviewed and updated as appropriate: allergies, current medications, past family history, past medical history, past social history, past surgical history and problem list.  Review of Systems Pertinent items are noted in HPI.   Objective:   Blood pressure 149/95, pulse 93, temperature 97.8 F (36.6 C), temperature source Oral, resp. rate 12, height 5\' 3"  (1.6 m), weight 64.91 kg (143 lb 1.6 oz), last menstrual period 04/09/2011. GENERAL: Well-developed, well-nourished female in no acute distress.  HEENT: Normocephalic, atraumatic. Sclerae anicteric.  NECK: Supple. Normal thyroid.  LUNGS: Clear to auscultation bilaterally.  HEART: Regular rate and rhythm. BREASTS: Symmetric with everted nipples. No masses, skin changes, nipple drainage, or lymphadenopathy. ABDOMEN: Soft, nontender, nondistended. No organomegaly. PELVIC: Normal external female genitalia. Vagina is pink and rugated.  Normal discharge. Normal cervix  contour. Pap smear obtained. Uterus is normal in size. No adnexal mass or tenderness.  EXTREMITIES: No cyanosis, clubbing, or edema, 2+ distal pulses.   Assessment:    Healthy female exam   HTN Vasomotor symptoms BV   Plan:   Will follow up with PCP regarding HTN management Follow up pap smear Routine preventative health maintenance measures emphasized

## 2011-09-06 NOTE — Patient Instructions (Addendum)
Preventive Care for Adults, Female A healthy lifestyle and preventive care can promote health and wellness. Preventive health guidelines for women include the following key practices.  A routine yearly physical is a good way to check with your caregiver about your health and preventive screening. It is a chance to share any concerns and updates on your health, and to receive a thorough exam.   Visit your dentist for a routine exam and preventive care every 6 months. Brush your teeth twice a day and floss once a day. Good oral hygiene prevents tooth decay and gum disease.   The frequency of eye exams is based on your age, health, family medical history, use of contact lenses, and other factors. Follow your caregiver's recommendations for frequency of eye exams.   Eat a healthy diet. Foods like vegetables, fruits, whole grains, low-fat dairy products, and lean protein foods contain the nutrients you need without too many calories. Decrease your intake of foods high in solid fats, added sugars, and salt. Eat the right amount of calories for you.Get information about a proper diet from your caregiver, if necessary.   Regular physical exercise is one of the most important things you can do for your health. Most adults should get at least 150 minutes of moderate-intensity exercise (any activity that increases your heart rate and causes you to sweat) each week. In addition, most adults need muscle-strengthening exercises on 2 or more days a week.   Maintain a healthy weight. The body mass index (BMI) is a screening tool to identify possible weight problems. It provides an estimate of body fat based on height and weight. Your caregiver can help determine your BMI, and can help you achieve or maintain a healthy weight.For adults 20 years and older:   A BMI below 18.5 is considered underweight.   A BMI of 18.5 to 24.9 is normal.   A BMI of 25 to 29.9 is considered overweight.   A BMI of 30 and above  is considered obese.   Maintain normal blood lipids and cholesterol levels by exercising and minimizing your intake of saturated fat. Eat a balanced diet with plenty of fruit and vegetables. Blood tests for lipids and cholesterol should begin at age 20 and be repeated every 5 years. If your lipid or cholesterol levels are high, you are over 50, or you are at high risk for heart disease, you may need your cholesterol levels checked more frequently.Ongoing high lipid and cholesterol levels should be treated with medicines if diet and exercise are not effective.   If you smoke, find out from your caregiver how to quit. If you do not use tobacco, do not start.   If you are pregnant, do not drink alcohol. If you are breastfeeding, be very cautious about drinking alcohol. If you are not pregnant and choose to drink alcohol, do not exceed 1 drink per day. One drink is considered to be 12 ounces (355 mL) of beer, 5 ounces (148 mL) of wine, or 1.5 ounces (44 mL) of liquor.   Avoid use of street drugs. Do not share needles with anyone. Ask for help if you need support or instructions about stopping the use of drugs.   High blood pressure causes heart disease and increases the risk of stroke. Your blood pressure should be checked at least every 1 to 2 years. Ongoing high blood pressure should be treated with medicines if weight loss and exercise are not effective.   If you are 55 to 48   years old, ask your caregiver if you should take aspirin to prevent strokes.   Diabetes screening involves taking a blood sample to check your fasting blood sugar level. This should be done once every 3 years, after age 45, if you are within normal weight and without risk factors for diabetes. Testing should be considered at a younger age or be carried out more frequently if you are overweight and have at least 1 risk factor for diabetes.   Breast cancer screening is essential preventive care for women. You should practice  "breast self-awareness." This means understanding the normal appearance and feel of your breasts and may include breast self-examination. Any changes detected, no matter how small, should be reported to a caregiver. Women in their 20s and 30s should have a clinical breast exam (CBE) by a caregiver as part of a regular health exam every 1 to 3 years. After age 40, women should have a CBE every year. Starting at age 40, women should consider having a mammography (breast X-ray test) every year. Women who have a family history of breast cancer should talk to their caregiver about genetic screening. Women at a high risk of breast cancer should talk to their caregivers about having magnetic resonance imaging (MRI) and a mammography every year.   The Pap test is a screening test for cervical cancer. A Pap test can show cell changes on the cervix that might become cervical cancer if left untreated. A Pap test is a procedure in which cells are obtained and examined from the lower end of the uterus (cervix).   Women should have a Pap test starting at age 21.   Between ages 21 and 29, Pap tests should be repeated every 2 years.   Beginning at age 30, you should have a Pap test every 3 years as long as the past 3 Pap tests have been normal.   Some women have medical problems that increase the chance of getting cervical cancer. Talk to your caregiver about these problems. It is especially important to talk to your caregiver if a new problem develops soon after your last Pap test. In these cases, your caregiver may recommend more frequent screening and Pap tests.   The above recommendations are the same for women who have or have not gotten the vaccine for human papillomavirus (HPV).   If you had a hysterectomy for a problem that was not cancer or a condition that could lead to cancer, then you no longer need Pap tests. Even if you no longer need a Pap test, a regular exam is a good idea to make sure no other  problems are starting.   If you are between ages 65 and 70, and you have had normal Pap tests going back 10 years, you no longer need Pap tests. Even if you no longer need a Pap test, a regular exam is a good idea to make sure no other problems are starting.   If you have had past treatment for cervical cancer or a condition that could lead to cancer, you need Pap tests and screening for cancer for at least 20 years after your treatment.   If Pap tests have been discontinued, risk factors (such as a new sexual partner) need to be reassessed to determine if screening should be resumed.   The HPV test is an additional test that may be used for cervical cancer screening. The HPV test looks for the virus that can cause the cell changes on the cervix.   The cells collected during the Pap test can be tested for HPV. The HPV test could be used to screen women aged 30 years and older, and should be used in women of any age who have unclear Pap test results. After the age of 30, women should have HPV testing at the same frequency as a Pap test.   Colorectal cancer can be detected and often prevented. Most routine colorectal cancer screening begins at the age of 50 and continues through age 75. However, your caregiver may recommend screening at an earlier age if you have risk factors for colon cancer. On a yearly basis, your caregiver may provide home test kits to check for hidden blood in the stool. Use of a small camera at the end of a tube, to directly examine the colon (sigmoidoscopy or colonoscopy), can detect the earliest forms of colorectal cancer. Talk to your caregiver about this at age 50, when routine screening begins. Direct examination of the colon should be repeated every 5 to 10 years through age 75, unless early forms of pre-cancerous polyps or small growths are found.   Hepatitis C blood testing is recommended for all people born from 1945 through 1965 and any individual with known risks for  hepatitis C.   Practice safe sex. Use condoms and avoid high-risk sexual practices to reduce the spread of sexually transmitted infections (STIs). STIs include gonorrhea, chlamydia, syphilis, trichomonas, herpes, HPV, and human immunodeficiency virus (HIV). Herpes, HIV, and HPV are viral illnesses that have no cure. They can result in disability, cancer, and death. Sexually active women aged 25 and younger should be checked for chlamydia. Older women with new or multiple partners should also be tested for chlamydia. Testing for other STIs is recommended if you are sexually active and at increased risk.   Osteoporosis is a disease in which the bones lose minerals and strength with aging. This can result in serious bone fractures. The risk of osteoporosis can be identified using a bone density scan. Women ages 65 and over and women at risk for fractures or osteoporosis should discuss screening with their caregivers. Ask your caregiver whether you should take a calcium supplement or vitamin D to reduce the rate of osteoporosis.   Menopause can be associated with physical symptoms and risks. Hormone replacement therapy is available to decrease symptoms and risks. You should talk to your caregiver about whether hormone replacement therapy is right for you.   Use sunscreen with sun protection factor (SPF) of 30 or more. Apply sunscreen liberally and repeatedly throughout the day. You should seek shade when your shadow is shorter than you. Protect yourself by wearing long sleeves, pants, a wide-brimmed hat, and sunglasses year round, whenever you are outdoors.   Once a month, do a whole body skin exam, using a mirror to look at the skin on your back. Notify your caregiver of new moles, moles that have irregular borders, moles that are larger than a pencil eraser, or moles that have changed in shape or color.   Stay current with required immunizations.   Influenza. You need a dose every fall (or winter). The  composition of the flu vaccine changes each year, so being vaccinated once is not enough.   Pneumococcal polysaccharide. You need 1 to 2 doses if you smoke cigarettes or if you have certain chronic medical conditions. You need 1 dose at age 65 (or older) if you have never been vaccinated.   Tetanus, diphtheria, pertussis (Tdap, Td). Get 1 dose of   Tdap vaccine if you are younger than age 65, are over 65 and have contact with an infant, are a healthcare worker, are pregnant, or simply want to be protected from whooping cough. After that, you need a Td booster dose every 10 years. Consult your caregiver if you have not had at least 3 tetanus and diphtheria-containing shots sometime in your life or have a deep or dirty wound.   HPV. You need this vaccine if you are a woman age 26 or younger. The vaccine is given in 3 doses over 6 months.   Measles, mumps, rubella (MMR). You need at least 1 dose of MMR if you were born in 1957 or later. You may also need a second dose.   Meningococcal. If you are age 19 to 21 and a first-year college student living in a residence hall, or have one of several medical conditions, you need to get vaccinated against meningococcal disease. You may also need additional booster doses.   Zoster (shingles). If you are age 60 or older, you should get this vaccine.   Varicella (chickenpox). If you have never had chickenpox or you were vaccinated but received only 1 dose, talk to your caregiver to find out if you need this vaccine.   Hepatitis A. You need this vaccine if you have a specific risk factor for hepatitis A virus infection or you simply wish to be protected from this disease. The vaccine is usually given as 2 doses, 6 to 18 months apart.   Hepatitis B. You need this vaccine if you have a specific risk factor for hepatitis B virus infection or you simply wish to be protected from this disease. The vaccine is given in 3 doses, usually over 6 months.  Preventive Services /  Frequency Ages 40 to 64  Blood pressure check.** / Every 1 to 2 years.   Lipid and cholesterol check.** / Every 5 years beginning at age 20.   Clinical breast exam.** / Every year after age 40.   Mammogram.** / Every year beginning at age 40 and continuing for as long as you are in good health. Consult with your caregiver.   Pap test.** / Every 3 years starting at age 30 through age 65 or 70 with a history of 3 consecutive normal Pap tests.   HPV screening.** / Every 3 years from ages 30 through ages 65 to 70 with a history of 3 consecutive normal Pap tests.   Fecal occult blood test (FOBT) of stool. / Every year beginning at age 50 and continuing until age 75. You may not need to do this test if you get a colonoscopy every 10 years.   Flexible sigmoidoscopy or colonoscopy.** / Every 5 years for a flexible sigmoidoscopy or every 10 years for a colonoscopy beginning at age 50 and continuing until age 75.   Hepatitis C blood test.** / For all people born from 1945 through 1965 and any individual with known risks for hepatitis C.   Skin self-exam. / Monthly.   Influenza immunization.** / Every year.   Pneumococcal polysaccharide immunization.** / 1 to 2 doses if you smoke cigarettes or if you have certain chronic medical conditions.   Tetanus, diphtheria, pertussis (Tdap, Td) immunization.** / A one-time dose of Tdap vaccine. After that, you need a Td booster dose every 10 years.   Measles, mumps, rubella (MMR) immunization. / You need at least 1 dose of MMR if you were born in 1957 or later. You may also need a   second dose.   Varicella immunization.** / Consult your caregiver.   Meningococcal immunization.** / Consult your caregiver.   Hepatitis A immunization.** / Consult your caregiver. 2 doses, 6 to 18 months apart.   Hepatitis B immunization.** / Consult your caregiver. 3 doses, usually over 6 months.  ** Family history and personal history of risk and conditions may change  your caregiver's recommendations. Document Released: 03/21/2001 Document Revised: 01/12/2011 Document Reviewed: 06/20/2010 [employer] Patient Information 2012 [employer]. 

## 2011-09-06 NOTE — Progress Notes (Signed)
Patient ID: Laura Huerta, female   DOB: Oct 31, 1965, 46 y.o.   MRN: [id number]  Recheck BP - 164/109 Pt. Denies history of HTN or current hypertensive symptoms.

## 2012-01-15 ENCOUNTER — Emergency Department (HOSPITAL_COMMUNITY)
Admission: EM | Admit: 2012-01-15 | Discharge: 2012-01-15 | Disposition: A | Discharge status: Home / Self Care | Payer: Medicaid Other | Attending: Emergency Medicine | Admitting: Emergency Medicine

## 2012-01-15 ENCOUNTER — Encounter (HOSPITAL_COMMUNITY): Payer: Self-pay | Admitting: *Deleted

## 2012-01-15 DIAGNOSIS — K644 Residual hemorrhoidal skin tags: Secondary | ICD-10-CM | POA: Insufficient documentation

## 2012-01-15 DIAGNOSIS — I10 Essential (primary) hypertension: Secondary | ICD-10-CM | POA: Insufficient documentation

## 2012-01-15 DIAGNOSIS — Z79899 Other long term (current) drug therapy: Secondary | ICD-10-CM | POA: Insufficient documentation

## 2012-01-15 DIAGNOSIS — F17200 Nicotine dependence, unspecified, uncomplicated: Secondary | ICD-10-CM | POA: Insufficient documentation

## 2012-01-15 DIAGNOSIS — K219 Gastro-esophageal reflux disease without esophagitis: Secondary | ICD-10-CM | POA: Insufficient documentation

## 2012-01-15 HISTORY — DX: Essential (primary) hypertension: I10

## 2012-01-15 MED ORDER — LISINOPRIL 20 MG PO TABS: 20.0000 mg | ORAL_TABLET | Freq: Every day | ORAL | Status: DC

## 2012-01-15 MED ORDER — DOCUSATE SODIUM 100 MG PO CAPS: 100.0000 mg | ORAL_CAPSULE | Freq: Two times a day (BID) | ORAL | Status: DC

## 2012-01-15 MED ORDER — HYDROCORTISONE 2.5 % RE CREA: TOPICAL_CREAM | RECTAL | Status: DC

## 2012-01-15 MED ORDER — OMEPRAZOLE 40 MG PO CPDR: 40.0000 mg | DELAYED_RELEASE_CAPSULE | Freq: Every day | ORAL | Status: DC

## 2012-01-15 MED ORDER — HYDROCODONE-ACETAMINOPHEN 5-325 MG PO TABS
2.0000 | ORAL_TABLET | Freq: Once | ORAL | Status: AC
  Administered 2012-01-15: 2 via ORAL
  Filled 2012-01-15: qty 2

## 2012-01-15 MED ORDER — ACETAMINOPHEN-CODEINE #3 300-30 MG PO TABS: 1.0000 | ORAL_TABLET | Freq: Four times a day (QID) | ORAL | Status: DC | PRN

## 2012-01-15 NOTE — ED Provider Notes (Signed)
History   This chart was scribed for Derwood Kaplan, MD, by Frederik Pear, ER scribe. The patient was seen in room TR09C/TR09C and the patient's care was started at 1819.    CSN: [id number]  Arrival date & time 01/15/12  1751   First MD Initiated Contact with Patient 01/15/12 1819      Chief Complaint  Patient presents with  . Hemorrhoids    (Consider location/radiation/quality/duration/timing/severity/associated sxs/prior treatment) HPI Comments: Laura Huerta is a 46 y.o. female with a h/o of internal and external hemorrhoids who presents to the Emergency Department complaining of constant, moderate hemorrhoid pain that began yesterday evening. She reports that the current pain is similar to previous pain associated with her external hemorrhoids.She reports that she took Tylenol, ibuprofen, and Preparation H at home with no relief. She denies any constipation, fever, chills, nausea, or vomiting.     Past Medical History  Diagnosis Date  . Acid reflux   . Bronchitis   . Hypertension     Past Surgical History  Procedure Date  . Cesarean section     History reviewed. No pertinent family history.  History  Substance Use Topics  . Smoking status: Current Every Day Smoker -- 2.0 packs/day for 10 years    Types: Cigarettes  . Smokeless tobacco: Never Used  . Alcohol Use: 1.2 oz/week    2 Cans of beer per week    OB History    Grav Para Term Preterm Abortions TAB SAB Ect Mult Living   7 2 2  5 5    2       Review of Systems A complete 10 system review of systems was obtained and all systems are negative except as noted in the HPI and PMH.   Allergies  Review of patient's allergies indicates no known allergies.  Home Medications   Current Outpatient Rx  Name  Route  Sig  Dispense  Refill  . LISINOPRIL 20 MG PO TABS   Oral   Take 20 mg by mouth daily.         Marland Kitchen OMEPRAZOLE 40 MG PO CPDR   Oral   Take 40 mg by mouth daily.           BP 173/124   Pulse 73  Temp 97.5 F (36.4 C) (Oral)  Resp 20  SpO2 98%  Physical Exam  Nursing note and vitals reviewed. Cardiovascular: Normal rate, regular rhythm and normal heart sounds.   No murmur heard. Pulmonary/Chest: Effort normal and breath sounds normal.  Abdominal: Soft. Bowel sounds are normal.  Genitourinary:       She has an external hemorrhoid which has no evidence of infection, pus or thrombosis.      ED Course  Procedures (including critical care time)  DIAGNOSTIC STUDIES: Oxygen Saturation is 98% on room air, normal by my interpretation.    COORDINATION OF CARE:  18:52- Discussed planned course of treatment with the patient, including a topical ointment, who is agreeable at this time.    Labs Reviewed - No data to display No results found.   No diagnosis found.    MDM  I personally performed the services described in this documentation, which was scribed in my presence. The recorded information has been reviewed and is accurate.  Pt comes in with cc of hemorrhoid. Exam showing external hemorrhoids - there is no thrombosis or infection however.  Plan is to send home with some analgesics and primary remedies for hemorrhoidal pain. Advised to return if she  develops worsening pain.      Derwood Kaplan, MD 01/15/12 [phone]

## 2012-01-15 NOTE — ED Notes (Signed)
Pt states she began having hemorrhoid pain yesterday morning when she awakened. She states she has been taking ibuprofen, tylenol, and otc hemorrhoid cream but nothing has been working. Pt states "the hemorrhoid is hanging out, I tried to push it back in". Last bowel movement was 01/13/12. Rates pain a 12/10.

## 2012-01-15 NOTE — ED Notes (Addendum)
Pt reports hemorrhoids that have been painful since yesterday.  Denies bleeding.  NAD.  Pt was treated at [facility] in June for same.  Hemorrhoid cream is not helping with pain.  Pt did not take HTN medications today.

## 2012-03-28 ENCOUNTER — Emergency Department (INDEPENDENT_AMBULATORY_CARE_PROVIDER_SITE_OTHER)
Admission: EM | Admit: 2012-03-28 | Discharge: 2012-03-28 | Disposition: A | Discharge status: Home / Self Care | Payer: Medicaid Other | Source: Home / Self Care

## 2012-03-28 ENCOUNTER — Encounter (HOSPITAL_COMMUNITY): Payer: Self-pay | Admitting: *Deleted

## 2012-03-28 ENCOUNTER — Other Ambulatory Visit (HOSPITAL_COMMUNITY)
Admission: RE | Admit: 2012-03-28 | Discharge: 2012-03-28 | Disposition: A | Discharge status: Home / Self Care | Payer: Medicaid Other | Source: Ambulatory Visit | Attending: Emergency Medicine | Admitting: Emergency Medicine

## 2012-03-28 DIAGNOSIS — N760 Acute vaginitis: Secondary | ICD-10-CM | POA: Insufficient documentation

## 2012-03-28 DIAGNOSIS — I10 Essential (primary) hypertension: Secondary | ICD-10-CM

## 2012-03-28 DIAGNOSIS — N898 Other specified noninflammatory disorders of vagina: Secondary | ICD-10-CM

## 2012-03-28 DIAGNOSIS — Z113 Encounter for screening for infections with a predominantly sexual mode of transmission: Secondary | ICD-10-CM | POA: Insufficient documentation

## 2012-03-28 LAB — POCT URINALYSIS DIP (DEVICE)
Hgb urine dipstick: NEGATIVE
Protein, ur: NEGATIVE mg/dL
Specific Gravity, Urine: 1.015 (ref 1.005–1.030)
Urobilinogen, UA: 1 mg/dL (ref 0.0–1.0)
pH: 7 (ref 5.0–8.0)

## 2012-03-28 MED ORDER — METRONIDAZOLE 500 MG PO TABS: 500.0000 mg | ORAL_TABLET | Freq: Two times a day (BID) | ORAL | Status: DC

## 2012-03-28 MED ORDER — LISINOPRIL 10 MG PO TABS: ORAL_TABLET | ORAL | Status: DC

## 2012-03-28 NOTE — ED Notes (Signed)
Pt  has  2  Symptoms   She  Is  Out  Of  Her  bp  meds    And  She  Reports   Vaginal  Odor  And  Irritation       She       Reports  A  History  Of   BV   And  Htn           She   Also  Reports  A  Headache        She  Is  Awake  And  Alert   And  Oriented

## 2012-03-28 NOTE — ED Provider Notes (Signed)
History     CSN: [id number]  Arrival date & time 03/28/12  1018   First MD Initiated Contact with Patient 03/28/12 1025      Chief Complaint  Patient presents with  . Vaginal Discharge    (Consider location/radiation/quality/duration/timing/severity/associated sxs/prior treatment) HPI Comments: 47 year old female presents with a complaint of vaginal odor for 2 months. Approximately one month ago she went to [facility] for the same thing and was treated with Tindamax. She states she did not believe that that helped her. She denies vaginal pain and vaginal discharge scant and intermittent. She denies pelvic or abdominal pain, fever, chills, vomiting or diarrhea. Her second complaint is that she does not have medications for her blood pressure. She is currently prescribed lisinopril that she has been out of medications for several days. [facility]   Past Medical History  Diagnosis Date  . Acid reflux   . Bronchitis   . Hypertension     Past Surgical History  Procedure Laterality Date  . Cesarean section      No family history on file.  History  Substance Use Topics  . Smoking status: Current Every Day Smoker -- 2.00 packs/day for 10 years    Types: Cigarettes  . Smokeless tobacco: Never Used  . Alcohol Use: 1.2 oz/week    2 Cans of beer per week    OB History   Grav Para Term Preterm Abortions TAB SAB Ect Mult Living   7 2 2  5 5    2       Review of Systems  Constitutional: Negative.   Respiratory: Negative.   Cardiovascular: Negative.   Gastrointestinal: Negative.   Genitourinary: Positive for vaginal discharge. Negative for dysuria, urgency, frequency, hematuria, flank pain, decreased urine volume, vaginal bleeding, difficulty urinating, vaginal pain and pelvic pain.  Musculoskeletal: Negative.   Neurological: Positive for headaches.  Psychiatric/Behavioral: Negative.     Allergies  Review of patient's allergies indicates  no known allergies.  Home Medications   Current Outpatient Rx  Name  Route  Sig  Dispense  Refill  . acetaminophen-codeine (TYLENOL #3) 300-30 MG per tablet   Oral   Take 1-2 tablets by mouth every 6 (six) hours as needed for pain.   20 tablet   0   . docusate sodium (COLACE) 100 MG capsule   Oral   Take 1 capsule (100 mg total) by mouth every 12 (twelve) hours.   60 capsule   0   . hydrocortisone (ANUSOL-HC) 2.5 % rectal cream      Apply rectally 2 times daily for no more than a week   30 g   0   . lisinopril (PRINIVIL,ZESTRIL) 10 MG tablet      1/2 tablet daily for BP   15 tablet   0   . lisinopril (PRINIVIL,ZESTRIL) 20 MG tablet   Oral   Take 20 mg by mouth daily.         Marland Kitchen lisinopril (PRINIVIL,ZESTRIL) 20 MG tablet   Oral   Take 1 tablet (20 mg total) by mouth daily.   30 tablet   0   . metroNIDAZOLE (FLAGYL) 500 MG tablet   Oral   Take 1 tablet (500 mg total) by mouth 2 (two) times daily. X 7 days   14 tablet   0   . omeprazole (PRILOSEC) 40 MG capsule   Oral   Take 40 mg by mouth daily.         Marland Kitchen  omeprazole (PRILOSEC) 40 MG capsule   Oral   Take 1 capsule (40 mg total) by mouth daily.   30 capsule   0     BP 178/109  Pulse 78  Temp(Src) 98.9 F (37.2 C) (Oral)  Resp 24  SpO2 95%  Physical Exam  Nursing note and vitals reviewed. Constitutional: She is oriented to person, place, and time. She appears well-developed and well-nourished. No distress.  Eyes: Conjunctivae and EOM are normal.  Neck: Normal range of motion. Neck supple.  Cardiovascular: Normal rate, regular rhythm and normal heart sounds.   Pulmonary/Chest: Effort normal.  Abdominal: Soft. Bowel sounds are normal. She exhibits no distension and no mass. There is no tenderness. There is no rebound and no guarding.  Genitourinary:  External palpation of the abdomen and pelvis is negative for tenderness or masses. Normal external female genitalia  Vagina except speculum  easily. The cervix is quite small and parous. There is no erythema of the cervix. The cervix is positioned midline and posterior . His stool amount of slightly gray to white discharge in the vaginal vault.  No cervical motion tenderness. No adnexal tenderness.   Musculoskeletal: She exhibits no edema.  Neurological: She is alert and oriented to person, place, and time.  Skin: Skin is warm and dry.    ED Course  Procedures (including critical care time)  Labs Reviewed  POCT URINALYSIS DIP (DEVICE)  POCT PREGNANCY, URINE  CERVICOVAGINAL ANCILLARY ONLY   No results found.   1. Foul smelling vaginal discharge   2. HTN (hypertension)       MDM  Suspect she may have intermittent bacterial vaginosis. There does not appear to be evidence of a PID. Treat her with Flagyl for 7 days. Since her blood pressure is elevated she is now will refill one month supply of lisinopril 10 mg per day. She is instructed to followup with the doctor on her Medicaid card as soon as possible she will need further evaluation, blood work and other studies to assist her with refills of her psychiatric medications and other conditions. The patient is awake alert, in no acute distress and stable at discharge.         Hayden Rasmussen, NP 03/28/12 1130  Hayden Rasmussen, NP 03/28/12 2107

## 2012-03-29 NOTE — ED Provider Notes (Signed)
Medical screening examination/treatment/procedure(s) were performed by non-physician practitioner and as supervising physician I was immediately available for consultation/collaboration.   MORENO-COLL,Hisao Doo; MD  Tagg Eustice Moreno-Coll, MD 03/29/12 0705 

## 2012-03-29 NOTE — ED Notes (Addendum)
GC/Chlamydia pending, Affirm: Candida and Trich neg., Gardnerella pos.  Pt. adequately treated with  Flagyl. Vassie Moselle 03/29/2012 GC/Chlamydia neg. Cherly Anderson M

## 2012-05-09 ENCOUNTER — Encounter (HOSPITAL_COMMUNITY): Payer: Self-pay | Admitting: Emergency Medicine

## 2012-05-09 ENCOUNTER — Emergency Department (HOSPITAL_COMMUNITY)
Admission: EM | Admit: 2012-05-09 | Discharge: 2012-05-09 | Disposition: A | Discharge status: Home / Self Care | Payer: Medicaid Other | Source: Home / Self Care | Attending: Emergency Medicine | Admitting: Emergency Medicine

## 2012-05-09 DIAGNOSIS — J029 Acute pharyngitis, unspecified: Secondary | ICD-10-CM

## 2012-05-09 DIAGNOSIS — I10 Essential (primary) hypertension: Secondary | ICD-10-CM

## 2012-05-09 DIAGNOSIS — K2960 Other gastritis without bleeding: Secondary | ICD-10-CM

## 2012-05-09 MED ORDER — OMEPRAZOLE 40 MG PO CPDR: 40.0000 mg | DELAYED_RELEASE_CAPSULE | Freq: Every day | ORAL | Status: DC

## 2012-05-09 MED ORDER — GUAIFENESIN-DM 100-10 MG/5ML PO SYRP: 5.0000 mL | ORAL_SOLUTION | Freq: Three times a day (TID) | ORAL | Status: DC | PRN

## 2012-05-09 MED ORDER — LISINOPRIL 20 MG PO TABS: 20.0000 mg | ORAL_TABLET | Freq: Every day | ORAL | Status: DC

## 2012-05-09 NOTE — ED Notes (Signed)
C/o sore throat onset last night.  No fever.  Cough started 3 days ago.  She thought it was from cigarette smoke.  C/o elevated BP this afternoon.  Has not taken her BP medicine today.  Ran out of her medicine.

## 2012-05-09 NOTE — ED Provider Notes (Signed)
History     CSN: [id number]  Arrival date & time 05/09/12  1815   First MD Initiated Contact with Patient 05/09/12 1837      Chief Complaint  Patient presents with  . Sore Throat    (Consider location/radiation/quality/duration/timing/severity/associated sxs/prior treatment) HPI Comments: Patient presents urgent care describing for 3-5 days she's been having a cough and also a sore throat. Denies any fevers or difficulty swallowing but it does hurt when she swallows and occasionally sees phlegm when she coughs. She is a current smoker. She denies any fevers shortness of breath or wheezing. She's also requesting a refill of her blood pressure medicine and her reflux medicine. Patient is in transition to be established in a new primary care Dr.. This waiting for her Medicaid to get reactivated.  Patient is a 47 y.o. female presenting with pharyngitis. The history is provided by the patient.  Sore Throat This is a new problem. The current episode started more than 2 days ago. The problem occurs constantly. The problem has been rapidly worsening. Pertinent negatives include no chest pain, no abdominal pain, no headaches and no shortness of breath. The symptoms are aggravated by swallowing. Nothing relieves the symptoms. The treatment provided no relief.    Past Medical History  Diagnosis Date  . Acid reflux   . Bronchitis   . Hypertension     Past Surgical History  Procedure Laterality Date  . Cesarean section      No family history on file.  History  Substance Use Topics  . Smoking status: Current Every Day Smoker -- 2.00 packs/day for 10 years    Types: Cigarettes  . Smokeless tobacco: Never Used  . Alcohol Use: 1.2 oz/week    2 Cans of beer per week    OB History   Grav Para Term Preterm Abortions TAB SAB Ect Mult Living   7 2 2  5 5    2       Review of Systems  Constitutional: Negative for fever, chills, diaphoresis, activity change, appetite change, fatigue and  unexpected weight change.  HENT: Positive for sore throat. Negative for ear pain, congestion, facial swelling, trouble swallowing, neck pain, neck stiffness, dental problem, voice change, postnasal drip and ear discharge.   Respiratory: Positive for cough. Negative for chest tightness and shortness of breath.   Cardiovascular: Negative for chest pain.  Gastrointestinal: Negative for abdominal pain and diarrhea.  Musculoskeletal: Negative for myalgias, back pain, joint swelling, arthralgias and gait problem.  Skin: Negative for rash.  Neurological: Negative for headaches.    Allergies  Review of patient's allergies indicates no known allergies.  Home Medications   Current Outpatient Rx  Name  Route  Sig  Dispense  Refill  . acetaminophen-codeine (TYLENOL #3) 300-30 MG per tablet   Oral   Take 1-2 tablets by mouth every 6 (six) hours as needed for pain.   20 tablet   0   . docusate sodium (COLACE) 100 MG capsule   Oral   Take 1 capsule (100 mg total) by mouth every 12 (twelve) hours.   60 capsule   0   . hydrocortisone (ANUSOL-HC) 2.5 % rectal cream      Apply rectally 2 times daily for no more than a week   30 g   0   . lisinopril (PRINIVIL,ZESTRIL) 10 MG tablet      1/2 tablet daily for BP   15 tablet   0   . lisinopril (PRINIVIL,ZESTRIL) 20 MG tablet  Oral   Take 20 mg by mouth daily.         Marland Kitchen lisinopril (PRINIVIL,ZESTRIL) 20 MG tablet   Oral   Take 1 tablet (20 mg total) by mouth daily.   30 tablet   0   . metroNIDAZOLE (FLAGYL) 500 MG tablet   Oral   Take 1 tablet (500 mg total) by mouth 2 (two) times daily. X 7 days   14 tablet   0   . omeprazole (PRILOSEC) 40 MG capsule   Oral   Take 40 mg by mouth daily.         Marland Kitchen omeprazole (PRILOSEC) 40 MG capsule   Oral   Take 1 capsule (40 mg total) by mouth daily.   30 capsule   0     BP 178/87  Pulse 73  Temp(Src) 98.2 F (36.8 C) (Oral)  Resp 16  SpO2 98%  Physical Exam  Nursing note and  vitals reviewed. Constitutional: She appears well-developed and well-nourished.  HENT:  Mouth/Throat: Uvula is midline. Oropharyngeal exudate and posterior oropharyngeal erythema present. No posterior oropharyngeal edema or tonsillar abscesses.  Eyes: Conjunctivae are normal. No scleral icterus.  Neck: Neck supple. No JVD present.  Cardiovascular: Exam reveals no gallop.   No murmur heard. Pulmonary/Chest: Effort normal and breath sounds normal. No respiratory distress. She has no wheezes.  Skin: No rash noted. No erythema.    ED Course  Procedures (including critical care time)  Labs Reviewed  POCT RAPID STREP A ([facility] ONLY)   No results found.   No diagnosis found.    MDM  Patient with mild pharyngitis. Move her sore patient with synovial and have refilled her omeprazole prescription. Have advised patient about risk associated with smoking and risk of uncontrolled high blood pressure.         Jimmie Molly, MD 05/09/12 [phone]

## 2012-07-17 ENCOUNTER — Encounter (HOSPITAL_COMMUNITY): Payer: Self-pay | Admitting: *Deleted

## 2012-07-17 ENCOUNTER — Emergency Department (HOSPITAL_COMMUNITY)
Admission: EM | Admit: 2012-07-17 | Discharge: 2012-07-17 | Disposition: A | Discharge status: Home / Self Care | Payer: No Typology Code available for payment source | Attending: Emergency Medicine | Admitting: Emergency Medicine

## 2012-07-17 ENCOUNTER — Emergency Department (HOSPITAL_COMMUNITY): Payer: No Typology Code available for payment source

## 2012-07-17 DIAGNOSIS — K219 Gastro-esophageal reflux disease without esophagitis: Secondary | ICD-10-CM | POA: Insufficient documentation

## 2012-07-17 DIAGNOSIS — Y998 Other external cause status: Secondary | ICD-10-CM | POA: Insufficient documentation

## 2012-07-17 DIAGNOSIS — K5790 Diverticulosis of intestine, part unspecified, without perforation or abscess without bleeding: Secondary | ICD-10-CM

## 2012-07-17 DIAGNOSIS — Y92410 Unspecified street and highway as the place of occurrence of the external cause: Secondary | ICD-10-CM | POA: Insufficient documentation

## 2012-07-17 DIAGNOSIS — S139XXA Sprain of joints and ligaments of unspecified parts of neck, initial encounter: Secondary | ICD-10-CM | POA: Insufficient documentation

## 2012-07-17 DIAGNOSIS — M62838 Other muscle spasm: Secondary | ICD-10-CM | POA: Insufficient documentation

## 2012-07-17 DIAGNOSIS — K5900 Constipation, unspecified: Secondary | ICD-10-CM | POA: Insufficient documentation

## 2012-07-17 DIAGNOSIS — R142 Eructation: Secondary | ICD-10-CM | POA: Insufficient documentation

## 2012-07-17 DIAGNOSIS — K5730 Diverticulosis of large intestine without perforation or abscess without bleeding: Secondary | ICD-10-CM | POA: Insufficient documentation

## 2012-07-17 DIAGNOSIS — S335XXA Sprain of ligaments of lumbar spine, initial encounter: Secondary | ICD-10-CM | POA: Insufficient documentation

## 2012-07-17 DIAGNOSIS — R141 Gas pain: Secondary | ICD-10-CM | POA: Insufficient documentation

## 2012-07-17 DIAGNOSIS — S161XXA Strain of muscle, fascia and tendon at neck level, initial encounter: Secondary | ICD-10-CM

## 2012-07-17 DIAGNOSIS — Z79899 Other long term (current) drug therapy: Secondary | ICD-10-CM | POA: Insufficient documentation

## 2012-07-17 DIAGNOSIS — S39012A Strain of muscle, fascia and tendon of lower back, initial encounter: Secondary | ICD-10-CM

## 2012-07-17 DIAGNOSIS — Z3202 Encounter for pregnancy test, result negative: Secondary | ICD-10-CM | POA: Insufficient documentation

## 2012-07-17 LAB — COMPREHENSIVE METABOLIC PANEL
Albumin: 3.9 g/dL (ref 3.5–5.2)
Alkaline Phosphatase: 79 U/L (ref 39–117)
BUN: 5 mg/dL — ABNORMAL LOW (ref 6–23)
CO2: 24 mEq/L (ref 19–32)
Chloride: 104 mEq/L (ref 96–112)
Creatinine, Ser: 0.72 mg/dL (ref 0.50–1.10)
GFR calc non Af Amer: >90 mL/min (ref >90)
Potassium: 4.1 mEq/L (ref 3.5–5.1)
Total Bilirubin: 0.3 mg/dL (ref 0.3–1.2)

## 2012-07-17 LAB — CBC WITH DIFFERENTIAL/PLATELET
Basophils Absolute: 0 10*3/uL (ref 0.0–0.1)
Lymphs Abs: 2.4 10*3/uL (ref 0.7–4.0)
MCH: 24.4 pg — ABNORMAL LOW (ref 26.0–34.0)
MCV: 73.7 fL — ABNORMAL LOW (ref 78.0–100.0)
Monocytes Absolute: 0.6 10*3/uL (ref 0.1–1.0)
Monocytes Relative: 6 % (ref 3–12)
Neutrophils Relative %: 68 % (ref 43–77)
Platelets: 236 10*3/uL (ref 150–400)
RBC: 5.78 MIL/uL — ABNORMAL HIGH (ref 3.87–5.11)
RDW: 14.8 % (ref 11.5–15.5)
WBC: 9.5 10*3/uL (ref 4.0–10.5)

## 2012-07-17 LAB — URINALYSIS, ROUTINE W REFLEX MICROSCOPIC
Bilirubin Urine: NEGATIVE
Ketones, ur: NEGATIVE mg/dL
Leukocytes, UA: NEGATIVE
Nitrite: NEGATIVE
Protein, ur: NEGATIVE mg/dL
Urobilinogen, UA: 0.2 mg/dL (ref 0.0–1.0)

## 2012-07-17 MED ORDER — IOHEXOL 300 MG/ML  SOLN
100.0000 mL | Freq: Once | INTRAMUSCULAR | Status: AC | PRN
  Administered 2012-07-17: 100 mL via INTRAVENOUS

## 2012-07-17 MED ORDER — DOCUSATE SODIUM 100 MG PO CAPS: 100.0000 mg | ORAL_CAPSULE | Freq: Two times a day (BID) | ORAL | Status: DC

## 2012-07-17 MED ORDER — DIAZEPAM 5 MG/ML IJ SOLN
2.5000 mg | Freq: Once | INTRAMUSCULAR | Status: AC
  Administered 2012-07-17: 2.5 mg via INTRAMUSCULAR
  Filled 2012-07-17: qty 2

## 2012-07-17 MED ORDER — HYDROCODONE-ACETAMINOPHEN 5-325 MG PO TABS: 1.0000 | ORAL_TABLET | ORAL | Status: DC | PRN

## 2012-07-17 MED ORDER — DIAZEPAM 5 MG PO TABS: 5.0000 mg | ORAL_TABLET | Freq: Two times a day (BID) | ORAL | Status: DC

## 2012-07-17 MED ORDER — LISINOPRIL 20 MG PO TABS: 20.0000 mg | ORAL_TABLET | Freq: Every day | ORAL | Status: DC

## 2012-07-17 MED ORDER — POLYETHYLENE GLYCOL 3350 17 G PO PACK: 17.0000 g | PACK | Freq: Every day | ORAL | Status: DC
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
## 2012-07-17 NOTE — ED Notes (Signed)
c-collar applied at triage 

## 2012-07-17 NOTE — ED Notes (Signed)
Pt transported to CT ?
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
## 2012-09-05 ENCOUNTER — Emergency Department (HOSPITAL_COMMUNITY)
Admission: EM | Admit: 2012-09-05 | Discharge: 2012-09-06 | Disposition: A | Discharge status: Home / Self Care | Payer: Medicaid Other | Attending: Emergency Medicine | Admitting: Emergency Medicine

## 2012-09-05 ENCOUNTER — Encounter (HOSPITAL_COMMUNITY): Payer: Self-pay | Admitting: Emergency Medicine

## 2012-09-05 DIAGNOSIS — R51 Headache: Secondary | ICD-10-CM | POA: Insufficient documentation

## 2012-09-05 DIAGNOSIS — R9431 Abnormal electrocardiogram [ECG] [EKG]: Secondary | ICD-10-CM

## 2012-09-05 DIAGNOSIS — Y9229 Other specified public building as the place of occurrence of the external cause: Secondary | ICD-10-CM | POA: Insufficient documentation

## 2012-09-05 DIAGNOSIS — T148XXA Other injury of unspecified body region, initial encounter: Secondary | ICD-10-CM

## 2012-09-05 DIAGNOSIS — I10 Essential (primary) hypertension: Secondary | ICD-10-CM | POA: Insufficient documentation

## 2012-09-05 DIAGNOSIS — K219 Gastro-esophageal reflux disease without esophagitis: Secondary | ICD-10-CM | POA: Insufficient documentation

## 2012-09-05 DIAGNOSIS — F17200 Nicotine dependence, unspecified, uncomplicated: Secondary | ICD-10-CM | POA: Insufficient documentation

## 2012-09-05 DIAGNOSIS — S139XXA Sprain of joints and ligaments of unspecified parts of neck, initial encounter: Secondary | ICD-10-CM | POA: Insufficient documentation

## 2012-09-05 DIAGNOSIS — X500XXA Overexertion from strenuous movement or load, initial encounter: Secondary | ICD-10-CM | POA: Insufficient documentation

## 2012-09-05 DIAGNOSIS — Y939 Activity, unspecified: Secondary | ICD-10-CM | POA: Insufficient documentation

## 2012-09-05 DIAGNOSIS — R071 Chest pain on breathing: Secondary | ICD-10-CM | POA: Insufficient documentation

## 2012-09-05 DIAGNOSIS — Z8709 Personal history of other diseases of the respiratory system: Secondary | ICD-10-CM | POA: Insufficient documentation

## 2012-09-05 DIAGNOSIS — Z79899 Other long term (current) drug therapy: Secondary | ICD-10-CM | POA: Insufficient documentation

## 2012-09-05 LAB — CBC WITH DIFFERENTIAL/PLATELET
Basophils Absolute: 0.1 10*3/uL (ref 0.0–0.1)
Basophils Relative: 1 % (ref 0–1)
Eosinophils Absolute: 0.1 10*3/uL (ref 0.0–0.7)
Eosinophils Relative: 1 % (ref 0–5)
Lymphs Abs: 3.2 10*3/uL (ref 0.7–4.0)
MCH: 25 pg — ABNORMAL LOW (ref 26.0–34.0)
MCHC: 34.3 g/dL (ref 30.0–36.0)
MCV: 73 fL — ABNORMAL LOW (ref 78.0–100.0)
Platelets: 228 10*3/uL (ref 150–400)
RDW: 15.5 % (ref 11.5–15.5)

## 2012-09-05 LAB — COMPREHENSIVE METABOLIC PANEL
ALT: 6 U/L (ref 0–35)
Albumin: 3.7 g/dL (ref 3.5–5.2)
Calcium: 9.9 mg/dL (ref 8.4–10.5)
GFR calc Af Amer: >90 mL/min (ref >90)
Glucose, Bld: 103 mg/dL — ABNORMAL HIGH (ref 70–99)
Sodium: 137 mEq/L (ref 135–145)
Total Protein: 7.8 g/dL (ref 6.0–8.3)

## 2012-09-05 LAB — POCT I-STAT TROPONIN I

## 2012-09-05 NOTE — ED Notes (Signed)
PT. REPORTS HEADACHE THIS EVENING AND RIGHT CHEST PAIN , DENIES SOB , NAUSEA OR DIAPHORESIS .

## 2012-09-05 NOTE — ED Notes (Signed)
EKG signed/reviewed by Dr. Read Drivers.

## 2012-09-06 ENCOUNTER — Emergency Department (HOSPITAL_COMMUNITY): Payer: Medicaid Other

## 2012-09-06 MED ORDER — CYCLOBENZAPRINE HCL 10 MG PO TABS: 10.0000 mg | ORAL_TABLET | Freq: Two times a day (BID) | ORAL | Status: DC | PRN

## 2012-09-06 MED ORDER — TRAMADOL HCL 50 MG PO TABS: 50.0000 mg | ORAL_TABLET | Freq: Four times a day (QID) | ORAL | Status: DC | PRN

## 2012-09-06 MED ORDER — KETOROLAC TROMETHAMINE 60 MG/2ML IM SOLN
60.0000 mg | Freq: Once | INTRAMUSCULAR | Status: AC
  Administered 2012-09-06: 60 mg via INTRAMUSCULAR
  Filled 2012-09-06: qty 2
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
## 2012-10-10 ENCOUNTER — Encounter: Payer: Self-pay | Admitting: Cardiology

## 2012-10-10 ENCOUNTER — Ambulatory Visit (INDEPENDENT_AMBULATORY_CARE_PROVIDER_SITE_OTHER): Payer: Medicaid Other | Admitting: Cardiology

## 2012-10-10 VITALS — BP 132/86 | HR 82 | Ht 63.0 in | Wt 146.6 lb

## 2012-10-10 DIAGNOSIS — R079 Chest pain, unspecified: Secondary | ICD-10-CM

## 2012-10-10 DIAGNOSIS — R9431 Abnormal electrocardiogram [ECG] [EKG]: Secondary | ICD-10-CM

## 2012-10-10 NOTE — Progress Notes (Signed)
HPI The patient presents for evaluation of chest discomfort. She was in the emergency room in late July. She was having she pains in the back of her neck. She was also having some chest discomfort. She was not admitted. I did review these records. She had no enzyme elevation although she did have an inferior T-wave inversion which is also present on the EKG today. She said she's been getting this chest discomfort since late July. It is under the right and left breasts. It is a heaviness. It comes and goes and can last all day when he does. It is not associated with other symptoms such as nausea vomiting or diaphoresis. She does not have shortness of breath, PND or orthopnea. She does not have palpitations, presyncope or syncope. She does not have cough fevers or chills. She says she might get this discomfort with some emotional stress to not with activities.  No Known Allergies  Current Outpatient Prescriptions  Medication Sig Dispense Refill  . amLODipine-benazepril (LOTREL) 10-20 MG per capsule Take 1 capsule by mouth daily.      Marland Kitchen omeprazole (PRILOSEC) 40 MG capsule Take 1 capsule (40 mg total) by mouth daily.  30 capsule  2   No current facility-administered medications for this visit.    Past Medical History  Diagnosis Date  . Acid reflux   . Bronchitis   . Hypertension     Past Surgical History  Procedure Laterality Date  . Cesarean section    . Tubal ligation      Family History  Problem Relation Age of Onset  . Hypertension Mother   . Heart failure Mother   . Hypertension Father   . Diabetes Father     History   Social History  . Marital Status: Single    Spouse Name: N/A    Number of Children: N/A  . Years of Education: N/A   Occupational History  . Not on file.   Social History Main Topics  . Smoking status: Current Every Day Smoker -- 2.00 packs/day for 10 years    Types: Cigarettes  . Smokeless tobacco: Never Used  . Alcohol Use: 1.2 oz/week    2 Cans  of beer per week  . Drug Use: No  . Sexual Activity: Yes    Birth Control/ Protection: Surgical   Other Topics Concern  . Not on file   Social History Narrative  . No narrative on file    ROS:  Positive for reflux, nausea, tinnitus, dizziness, joint pains, rhinitis.  Otherwise as stated in the HPI and negative for all other systems.   PHYSICAL EXAM BP 132/86  Pulse 82  Ht 5\' 3"  (1.6 m)  Wt 146 lb 9.6 oz (66.497 kg)  BMI 25.98 kg/m2  LMP 04/09/2011 GENERAL:  Well appearing HEENT:  Pupils equal round and reactive, fundi not visualized, oral mucosa unremarkable NECK:  No jugular venous distention, waveform within normal limits, carotid upstroke brisk and symmetric, no bruits, no thyromegaly LYMPHATICS:  No cervical, inguinal adenopathy LUNGS:  Clear to auscultation bilaterally BACK:  No CVA tenderness CHEST:  Unremarkable HEART:  PMI not displaced or sustained,S1 and S2 within normal limits, no S3, no S4, no clicks, no rubs, no murmurs ABD:  Flat, positive bowel sounds normal in frequency in pitch, no bruits, no rebound, no guarding, no midline pulsatile mass, no hepatomegaly, no splenomegaly EXT:  2 plus pulses throughout, no edema, no cyanosis no clubbing SKIN:  No rashes no nodules NEURO:  Cranial  nerves II through XII grossly intact, motor grossly intact throughout [facility]:  Cognitively intact, oriented to person place and time   EKG:  Then, rate 80 to, axis within normal limits,  inferior lateral T-wave inversions, QTC prolonged, poor anterior R wave progression. 10/10/2012  ASSESSMENT AND PLAN  CHEST PAIN:  The pretest probability of obstructive coronary disease is moderately high. Given this stress testing with perfusion imaging is indicated.  ABNORMAL EKG:  As above.  TOBACCO ABUSE:  She will try nicotine patches and gum.

## 2012-10-10 NOTE — Patient Instructions (Addendum)
Your physician recommends that you continue on your current medications as directed. Please refer to the Current Medication list given to you today.  Your physician has requested that you have en exercise stress myoview. For further information please visit [email or web address]. Please follow instruction sheet, as given.  Follow up as needed  

## 2012-10-22 ENCOUNTER — Other Ambulatory Visit: Payer: Self-pay

## 2012-10-22 ENCOUNTER — Other Ambulatory Visit (HOSPITAL_COMMUNITY): Payer: Self-pay | Admitting: Family Medicine

## 2012-10-22 DIAGNOSIS — Z1231 Encounter for screening mammogram for malignant neoplasm of breast: Secondary | ICD-10-CM

## 2012-11-04 ENCOUNTER — Ambulatory Visit: Payer: Medicaid Other

## 2012-11-05 ENCOUNTER — Ambulatory Visit (HOSPITAL_COMMUNITY): Payer: Medicaid Other | Attending: Cardiology | Admitting: Radiology

## 2012-11-05 VITALS — BP 137/104 | Ht 63.0 in | Wt 151.0 lb

## 2012-11-05 DIAGNOSIS — Z8249 Family history of ischemic heart disease and other diseases of the circulatory system: Secondary | ICD-10-CM | POA: Insufficient documentation

## 2012-11-05 DIAGNOSIS — R079 Chest pain, unspecified: Secondary | ICD-10-CM | POA: Insufficient documentation

## 2012-11-05 DIAGNOSIS — Z853 Personal history of malignant neoplasm of breast: Secondary | ICD-10-CM | POA: Insufficient documentation

## 2012-11-05 DIAGNOSIS — F17200 Nicotine dependence, unspecified, uncomplicated: Secondary | ICD-10-CM | POA: Insufficient documentation

## 2012-11-05 DIAGNOSIS — R0602 Shortness of breath: Secondary | ICD-10-CM

## 2012-11-05 DIAGNOSIS — I10 Essential (primary) hypertension: Secondary | ICD-10-CM | POA: Insufficient documentation

## 2012-11-05 DIAGNOSIS — R9431 Abnormal electrocardiogram [ECG] [EKG]: Secondary | ICD-10-CM | POA: Insufficient documentation

## 2012-11-05 MED ORDER — TECHNETIUM TC 99M SESTAMIBI GENERIC - CARDIOLITE
30.0000 | Freq: Once | INTRAVENOUS | Status: AC | PRN
  Administered 2012-11-05: 30 via INTRAVENOUS

## 2012-11-05 MED ORDER — TECHNETIUM TC 99M SESTAMIBI GENERIC - CARDIOLITE
10.0000 | Freq: Once | INTRAVENOUS | Status: AC | PRN
  Administered 2012-11-05: 10 via INTRAVENOUS

## 2012-11-05 NOTE — Progress Notes (Signed)
[facility] SITE 3 NUCLEAR MED [address] [place], [place] [address] [phone]    Cardiology Nuclear Med Study  Laura Huerta is a 47 y.o. female     MRN : [id number]     DOB: [birth year]  Procedure Date: 11/05/2012  Nuclear Med Background Indication for Stress Test:  Evaluation for Ischemia History:  H/O (L) Breast CA, 7/14 ED CP, (-) enzymes, Abn EKG T wave inversion Cardiac Risk Factors: Family History - CAD, Hypertension and Smoker  Symptoms:  Chest Pain   Nuclear Pre-Procedure Caffeine/Decaff Intake:  7:30pm NPO After: 9:30pm   Lungs:  clear O2 Sat: 96% on room air. IV 0.9% NS with Angio Cath:  22g  IV Site: R Hand  IV Started by:  Cathlyn Parsons, RN  Chest Size (in):  38 Cup Size: C  Height: 5\' 3"  (1.6 m)  Weight:  151 lb (68.493 kg)  BMI:  Body mass index is 26.76 kg/(m^2). Tech Comments:  n/a    Nuclear Med Study 1 or 2 day study: 1 day  Stress Test Type:  Stress  Reading MD: Cassell Clement, MD  Order Authorizing Provider:  Melany Guernsey  Resting Radionuclide: Technetium 30m Sestamibi  Resting Radionuclide Dose: 11.0 mCi   Stress Radionuclide:  Technetium 37m Sestamibi  Stress Radionuclide Dose: 33.0 mCi           Stress Protocol Rest HR: 75 Stress HR: 151  Rest BP: 137/104 Stress BP: 162/99  Exercise Time (min): 6:00 METS: 7.0   Predicted Max HR: 171 bpm % Max HR: 88.3 bpm Rate Pressure Product: 46962   Dose of Adenosine (mg):  n/a Dose of Lexiscan: n/a mg  Dose of Atropine (mg): n/a Dose of Dobutamine: n/a mcg/kg/min (at max HR)  Stress Test Technologist: Milana Na, EMT-P  Nuclear Technologist:  Doyne Keel, CNMT     Rest Procedure:  Myocardial perfusion imaging was performed at rest 45 minutes following the intravenous administration of Technetium 37m Sestamibi. Rest ECG: NSR with non-specific ST-T wave changes  Stress Procedure:  The patient exercised on the treadmill utilizing the Bruce Protocol for 6:00  minutes. The patient stopped due to fatigue and denied any chest pain.  Technetium 34m Sestamibi was injected at peak exercise and myocardial perfusion imaging was performed after a brief delay. Stress ECG: No significant change from baseline ECG  QPS Raw Data Images:  Normal; no motion artifact; normal heart/lung ratio. Stress Images:  There is decreased uptake in the apex. Rest Images:  Normal homogeneous uptake in all areas of the myocardium. Subtraction (SDS):  These findings are consistent with ischemia. May represent apical thinning but not seen on the resting images. Transient Ischemic Dilatation (Normal <1.22):  n/a Lung/Heart Ratio (Normal <0.45):  0.39  Quantitative Gated Spect Images QGS EDV:  72 ml QGS ESV:  19 ml  Impression Exercise Capacity:  Fair exercise capacity. BP Response:  Normal blood pressure response. Clinical Symptoms:  No symptoms. ECG Impression:  No significant ECG changes with Lexiscan. Comparison with Prior Nuclear Study: No previous nuclear study performed  Overall Impression:  Low risk stress nuclear study.  There is a small reversible defect at apex. This may represent apical thinning.   Wall motion analysis reveals vigorous systolic function at apex.  LV Ejection Fraction: 74%.  LV Wall Motion:  NL LV Function; NL Wall Motion   [employer]

## 2012-11-06 ENCOUNTER — Ambulatory Visit (HOSPITAL_COMMUNITY): Payer: Medicaid Other

## 2012-11-06 ENCOUNTER — Ambulatory Visit: Payer: Medicaid Other

## 2012-11-25 ENCOUNTER — Ambulatory Visit (HOSPITAL_COMMUNITY)
Admission: RE | Admit: 2012-11-25 | Discharge: 2012-11-25 | Disposition: A | Discharge status: Home / Self Care | Payer: Medicaid Other | Source: Ambulatory Visit | Attending: Obstetrics & Gynecology | Admitting: Obstetrics & Gynecology

## 2012-11-25 DIAGNOSIS — Z1231 Encounter for screening mammogram for malignant neoplasm of breast: Secondary | ICD-10-CM | POA: Insufficient documentation

## 2013-02-06 HISTORY — PX: HEMORRHOID BANDING: SHX5850

## 2013-03-21 ENCOUNTER — Encounter: Payer: Self-pay | Admitting: Gastroenterology

## 2013-04-22 ENCOUNTER — Ambulatory Visit (INDEPENDENT_AMBULATORY_CARE_PROVIDER_SITE_OTHER): Payer: Medicaid Other | Admitting: Gastroenterology

## 2013-04-22 ENCOUNTER — Encounter: Payer: Self-pay | Admitting: Gastroenterology

## 2013-04-22 VITALS — BP 100/70 | HR 76 | Ht 63.0 in | Wt 155.4 lb

## 2013-04-22 DIAGNOSIS — Z860100 Personal history of colon polyps, unspecified: Secondary | ICD-10-CM

## 2013-04-22 DIAGNOSIS — K625 Hemorrhage of anus and rectum: Secondary | ICD-10-CM

## 2013-04-22 NOTE — Patient Instructions (Signed)
Your first hemorrhoidal banding  is scheduled on 06/05/2013 at 8:45am

## 2013-04-22 NOTE — Assessment & Plan Note (Signed)
Will  try to obtain prior colonoscopy records to verify that she had polyps and to determine the pathology

## 2013-04-22 NOTE — Progress Notes (Signed)
_                                                                                                                History of Present Illness: A 48 year old Afro-American female referred for evaluation of rectal bleeding.  For about 4 years she's been experiencing bright red blood mixed with her stools and discolored the toilet water.  She occasionally has feelings of pressure in her rectum.  She apparently underwent colonoscopy with polypectomy about 2 years ago in [place].  Records are not available.  She has a history of protruding hemorrhoids which are painless.  Eating did not improve following polypectomy. In November, 2014 hemoglobin was 13.8 and MCV 75.  She is on no gastric irritants including nonsteroidals.  She has no upper GI complaints and denies change in bowel habits.    Past Medical History  Diagnosis Date  . Acid reflux   . Bronchitis   . Hypertension    Past Surgical History  Procedure Laterality Date  . Cesarean section    . Tubal ligation     family history includes Breast cancer in her maternal aunt; Diabetes in her father; Heart failure in her mother; Hypertension in her father and mother. Current Outpatient Prescriptions  Medication Sig Dispense Refill  . amLODipine-benazepril (LOTREL) 10-20 MG per capsule Take 1 capsule by mouth daily.      Marland Kitchen buPROPion (WELLBUTRIN) 75 MG tablet Take 75 mg by mouth 3 (three) times daily.      Marland Kitchen omeprazole (PRILOSEC) 40 MG capsule Take 1 capsule (40 mg total) by mouth daily.  30 capsule  2  . temazepam (RESTORIL) 15 MG capsule Take 15 mg by mouth at bedtime as needed for sleep.       No current facility-administered medications for this visit.   Allergies as of 04/22/2013  . (No Known Allergies)    reports that she has been smoking Cigarettes.  She has a 20 pack-year smoking history. She has never used smokeless tobacco. She reports that she drinks about 1.2 ounces of alcohol per week. She reports that she  does not use illicit drugs.     Review of Systems: Pertinent positive and negative review of systems were noted in the above HPI section. All other review of systems were otherwise negative.  Vital signs were reviewed in today's medical record Physical Exam: General: Well developed , well nourished, no acute distress Skin: anicteric Head: Normocephalic and atraumatic Eyes:  sclerae anicteric, EOMI Ears: Normal auditory acuity Mouth: No deformity or lesions Neck: Supple, no masses or thyromegaly Lungs: Clear throughout to auscultation Heart: Regular rate and rhythm; no murmurs, rubs or bruits Abdomen: Soft, non tender and non distended. No masses, hepatosplenomegaly or hernias noted. Normal Bowel sounds Rectal: There are no external abnormalities Musculoskeletal: Symmetrical with no gross deformities  Skin: No lesions on visible extremities Pulses:  Normal pulses noted Extremities: No clubbing, cyanosis, edema or deformities noted Neurological: Alert oriented x 4, grossly nonfocal Cervical Nodes:  No significant cervical  adenopathy Inguinal Nodes: No significant inguinal adenopathy Psychological:  Alert and cooperative. Normal mood and affect  See Assessment and Plan under Problem List

## 2013-04-22 NOTE — Assessment & Plan Note (Signed)
4 year history of frequent painless rectal bleeding.  Bleeding very well could be hemorrhoidal, especially in view of colonoscopy 2 years ago that demonstrated polyps and hemorrhoids.    Recommendations #1 band ligation of hemorrhoids after review of her prior colonoscopy to be certain that there were no other sources for lower GI bleeding

## 2013-05-22 ENCOUNTER — Telehealth: Payer: Self-pay | Admitting: Gastroenterology

## 2013-05-23 NOTE — Telephone Encounter (Signed)
Left message for pt to call back.  Pt had questions regarding her appt for banding. Discussed procedure with pt and that no prep was required. Pts questions answered.

## 2013-06-05 ENCOUNTER — Ambulatory Visit (INDEPENDENT_AMBULATORY_CARE_PROVIDER_SITE_OTHER): Payer: Medicaid Other | Admitting: Gastroenterology

## 2013-06-05 ENCOUNTER — Encounter (INDEPENDENT_AMBULATORY_CARE_PROVIDER_SITE_OTHER): Payer: Medicaid Other | Admitting: Gastroenterology

## 2013-06-05 ENCOUNTER — Encounter: Payer: Self-pay | Admitting: Gastroenterology

## 2013-06-05 VITALS — BP 124/70 | HR 78 | Ht 63.0 in | Wt 155.0 lb

## 2013-06-05 DIAGNOSIS — K648 Other hemorrhoids: Secondary | ICD-10-CM | POA: Insufficient documentation

## 2013-06-05 DIAGNOSIS — Z86010 Personal history of colonic polyps: Secondary | ICD-10-CM

## 2013-06-05 NOTE — Assessment & Plan Note (Addendum)
Plan follow-up colonoscopy 2016 

## 2013-06-05 NOTE — Patient Instructions (Signed)
HEMORRHOID BANDING PROCEDURE    FOLLOW-UP CARE   1. The procedure you have had should have been relatively painless since the banding of the area involved does not have nerve endings and there is no pain sensation.  The rubber band cuts off the blood supply to the hemorrhoid and the band may fall off as soon as 48 hours after the banding (the band may occasionally be seen in the toilet bowl following a bowel movement). You may notice a temporary feeling of fullness in the rectum which should respond adequately to plain Tylenol or Motrin.  2. Following the banding, avoid strenuous exercise that evening and resume full activity the next day.  A sitz bath (soaking in a warm tub) or bidet is soothing, and can be useful for cleansing the area after bowel movements.     3. To avoid constipation, take two tablespoons of natural wheat bran, natural oat bran, flax, Benefiber or any over the counter fiber supplement and increase your water intake to 7-8 glasses daily.    4. Unless you have been prescribed anorectal medication, do not put anything inside your rectum for two weeks: No suppositories, enemas, fingers, etc.  5. Occasionally, you may have more bleeding than usual after the banding procedure.  This is often from the untreated hemorrhoids rather than the treated one.  Don't be concerned if there is a tablespoon or so of blood.  If there is more blood than this, lie flat with your bottom higher than your head and apply an ice pack to the area. If the bleeding does not stop within a half an hour or if you feel faint, call our office at [phone] or go to the emergency room.  6. Problems are not common; however, if there is a substantial amount of bleeding, severe pain, chills, fever or difficulty passing urine (very rare) or other problems, you should call us at [phone] or report to the nearest emergency room.  7. Do not stay seated continuously for more than 2-3 hours for a day or two  after the procedure.  Tighten your buttock muscles 10-15 times every two hours and take 10-15 deep breaths every 1-2 hours.  Do not spend more than a few minutes on the toilet if you cannot empty your bowel; instead re-visit the toilet at a later time.   Your 2nd banding is scheduled on 07/04/2013 at 3:15pm

## 2013-06-05 NOTE — Progress Notes (Signed)
PROCEDURE NOTE: The patient presents with symptomatic grade *2**  hemorrhoids, requesting rubber band ligation of his/her hemorrhoidal disease.  All risks, benefits and alternative forms of therapy were described and informed consent was obtained.   The anorectum was pre-medicated with lubricant and nitroglycerine ointment The decision was made to band the *right posterior** internal hemorrhoid, and the [facility] O'Regan System was used to perform band ligation without complication.  Digital anorectal examination was then performed to assure proper positioning of the band, and to adjust the banded tissue as required.  The patient was discharged home without pain or other issues.  Dietary and behavioral recommendations were given and along with follow-up instructions.    The patient will return in **2* for  follow-up and possible additional banding as required. No complications were encountered and the patient tolerated the procedure well.   

## 2013-06-23 ENCOUNTER — Encounter: Payer: Self-pay | Admitting: Gastroenterology

## 2013-06-25 ENCOUNTER — Telehealth: Payer: Self-pay | Admitting: Gastroenterology

## 2013-06-25 NOTE — Telephone Encounter (Signed)
Pt wants a prescription for something to take for pain immediately after her banding procedure next week. States last time she was in a lot of pain. Please advise.

## 2013-06-25 NOTE — Telephone Encounter (Signed)
Pt aware.

## 2013-06-25 NOTE — Telephone Encounter (Signed)
Patient should not have pain following the procedure.  We do not need to prescribe pain medicine.  If she's having any pain with the procedure were medially from the procedure I need to know right away.  I will be happy to talk with her at her next visit.

## 2013-06-25 NOTE — Telephone Encounter (Signed)
Pt called back and wanted this RN to call and speak with a Dr. Hassell Done to request pain medication for her. Let pt know this RN will not call Dr. Hassell Done. Explained to her that Dr. Deatra Ina will not prescribe pain medication because she should not need it for hemorrhoid banding. Encouraged pt to come in and see Dr. Deatra Ina for her appt and discuss this with him at that time. Pt aware.

## 2013-07-03 ENCOUNTER — Encounter (HOSPITAL_COMMUNITY): Payer: Self-pay | Admitting: Emergency Medicine

## 2013-07-03 ENCOUNTER — Emergency Department (HOSPITAL_COMMUNITY)
Admission: EM | Admit: 2013-07-03 | Discharge: 2013-07-03 | Disposition: A | Discharge status: Home / Self Care | Payer: Medicaid Other | Source: Home / Self Care | Attending: Family Medicine | Admitting: Family Medicine

## 2013-07-03 DIAGNOSIS — IMO0002 Reserved for concepts with insufficient information to code with codable children: Secondary | ICD-10-CM

## 2013-07-03 DIAGNOSIS — X58XXXA Exposure to other specified factors, initial encounter: Secondary | ICD-10-CM

## 2013-07-03 DIAGNOSIS — S29011A Strain of muscle and tendon of front wall of thorax, initial encounter: Secondary | ICD-10-CM

## 2013-07-03 MED ORDER — DICLOFENAC SODIUM 50 MG PO TBEC: 50.0000 mg | DELAYED_RELEASE_TABLET | Freq: Two times a day (BID) | ORAL | Status: DC | PRN

## 2013-07-03 MED ORDER — TRAMADOL HCL 50 MG PO TABS: 50.0000 mg | ORAL_TABLET | Freq: Four times a day (QID) | ORAL | Status: DC | PRN

## 2013-07-03 NOTE — ED Provider Notes (Signed)
Laura Huerta is a 48 y.o. female who presents to Urgent Care today for left chest wall pain. Patient notes left chest wall pain starting yesterday. The pain occurred after she picked up a bag with her left arm. The pain is worse with arm motion. The pain is also worse with just general rest and better with general activity. She denies any central chest pain or exertional pain. She denies any shortness of breath or palpitation. She feels well otherwise. No fevers chills nausea vomiting or diarrhea.   Past Medical History  Diagnosis Date  . Acid reflux   . Bronchitis   . Hypertension    History  Substance Use Topics  . Smoking status: Current Every Day Smoker -- 2.00 packs/day for 10 years    Types: Cigarettes  . Smokeless tobacco: Never Used  . Alcohol Use: 1.2 oz/week    2 Cans of beer per week   ROS as above Medications: No current facility-administered medications for this encounter.   Current Outpatient Prescriptions  Medication Sig Dispense Refill  . amLODipine-benazepril (LOTREL) 10-20 MG per capsule Take 1 capsule by mouth daily.      Marland Kitchen buPROPion (WELLBUTRIN) 75 MG tablet Take 75 mg by mouth 3 (three) times daily.      Marland Kitchen omeprazole (PRILOSEC) 40 MG capsule Take 1 capsule (40 mg total) by mouth daily.  30 capsule  2  . temazepam (RESTORIL) 15 MG capsule Take 15 mg by mouth at bedtime as needed for sleep.        Exam:  BP 141/74  Pulse 83  Temp(Src) 98.1 F (36.7 C) (Oral)  Resp 12  SpO2 100%  LMP 04/09/2011 Gen: Well NAD HEENT: EOMI,  MMM Lungs: Normal work of breathing. CTABL Heart: RRR no MRG Abd: NABS, Soft. NT, ND Exts: Brisk capillary refill, warm and well perfused.  Left chest wall. Tender palpation across the anterior portion of the pectoralis. Pain is worse with external rotation and resisted adduction of the arm.  Capillary refill and pulses intact distally.  Twelve-lead EKG shows normal sinus rhythm at 77 beats per minute. No ST segment elevation or  depression. QTC 450  No results found for this or any previous visit (from the past 24 hour(s)). No results found.  Assessment and Plan: 48 y.o. female with left pectoralis strain. Plan to treat with tramadol and diclofenac. Followup with primary care provider.  Discussed warning signs or symptoms. Please see discharge instructions. Patient expresses understanding.    Gregor Hams, MD 07/03/13 Drema Halon

## 2013-07-03 NOTE — Discharge Instructions (Signed)
Thank you for coming in today. Follow up with your doctor.  Come back as needed.

## 2013-07-03 NOTE — ED Notes (Signed)
Pt c/o left shoulder pain onset last night Pain increases w/activity and pressure Denies inj/trauma, SOB, weakness, diaphoresis Alert w/no signs of acute distress.

## 2013-07-04 ENCOUNTER — Ambulatory Visit (INDEPENDENT_AMBULATORY_CARE_PROVIDER_SITE_OTHER): Payer: Medicaid Other | Admitting: Gastroenterology

## 2013-07-04 VITALS — BP 110/70 | HR 76 | Ht 63.0 in | Wt 154.6 lb

## 2013-07-04 DIAGNOSIS — K648 Other hemorrhoids: Secondary | ICD-10-CM

## 2013-07-04 NOTE — Progress Notes (Signed)
PROCEDURE NOTE: The patient presents with symptomatic grade *2**  hemorrhoids, requesting rubber band ligation of his/her hemorrhoidal disease.  All risks, benefits and alternative forms of therapy were described and informed consent was obtained.   The anorectum was pre-medicated with lubricant and nitroglycerine ointment The decision was made to band the *left lateral** internal hemorrhoid, and the [facility] O'Regan System was used to perform band ligation without complication.  Digital anorectal examination was then performed to assure proper positioning of the band, and to adjust the banded tissue as required.  The patient was discharged home without pain or other issues.  Dietary and behavioral recommendations were given and along with follow-up instructions.    The patient will return in *2** for  follow-up and possible additional banding as required. No complications were encountered and the patient tolerated the procedure well.   

## 2013-07-04 NOTE — Patient Instructions (Addendum)
HEMORRHOID BANDING PROCEDURE    FOLLOW-UP CARE   1. The procedure you have had should have been relatively painless since the banding of the area involved does not have nerve endings and there is no pain sensation.  The rubber band cuts off the blood supply to the hemorrhoid and the band may fall off as soon as 48 hours after the banding (the band may occasionally be seen in the toilet bowl following a bowel movement). You may notice a temporary feeling of fullness in the rectum which should respond adequately to plain Tylenol or Motrin.  2. Following the banding, avoid strenuous exercise that evening and resume full activity the next day.  A sitz bath (soaking in a warm tub) or bidet is soothing, and can be useful for cleansing the area after bowel movements.     3. To avoid constipation, take two tablespoons of natural wheat bran, natural oat bran, flax, Benefiber or any over the counter fiber supplement and increase your water intake to 7-8 glasses daily.    4. Unless you have been prescribed anorectal medication, do not put anything inside your rectum for two weeks: No suppositories, enemas, fingers, etc.  5. Occasionally, you may have more bleeding than usual after the banding procedure.  This is often from the untreated hemorrhoids rather than the treated one.  Don't be concerned if there is a tablespoon or so of blood.  If there is more blood than this, lie flat with your bottom higher than your head and apply an ice pack to the area. If the bleeding does not stop within a half an hour or if you feel faint, call our office at [phone] or go to the emergency room.  6. Problems are not common; however, if there is a substantial amount of bleeding, severe pain, chills, fever or difficulty passing urine (very rare) or other problems, you should call us at [phone] or report to the nearest emergency room.  7. Do not stay seated continuously for more than 2-3 hours for a day or two  after the procedure.  Tighten your buttock muscles 10-15 times every two hours and take 10-15 deep breaths every 1-2 hours.  Do not spend more than a few minutes on the toilet if you cannot empty your bowel; instead re-visit the toilet at a later time.   YOUR NEXT BANDING IS 7/30 AT 9:15AM

## 2013-08-20 ENCOUNTER — Encounter (HOSPITAL_COMMUNITY): Payer: Self-pay | Admitting: Emergency Medicine

## 2013-08-20 ENCOUNTER — Emergency Department (HOSPITAL_COMMUNITY)
Admission: EM | Admit: 2013-08-20 | Discharge: 2013-08-20 | Disposition: A | Discharge status: Home / Self Care | Payer: Medicaid Other | Attending: Emergency Medicine | Admitting: Emergency Medicine

## 2013-08-20 ENCOUNTER — Emergency Department (HOSPITAL_COMMUNITY): Payer: Medicaid Other

## 2013-08-20 DIAGNOSIS — Z791 Long term (current) use of non-steroidal anti-inflammatories (NSAID): Secondary | ICD-10-CM | POA: Diagnosis not present

## 2013-08-20 DIAGNOSIS — Z79899 Other long term (current) drug therapy: Secondary | ICD-10-CM | POA: Insufficient documentation

## 2013-08-20 DIAGNOSIS — I10 Essential (primary) hypertension: Secondary | ICD-10-CM | POA: Diagnosis not present

## 2013-08-20 DIAGNOSIS — S0993XA Unspecified injury of face, initial encounter: Secondary | ICD-10-CM | POA: Insufficient documentation

## 2013-08-20 DIAGNOSIS — F17200 Nicotine dependence, unspecified, uncomplicated: Secondary | ICD-10-CM | POA: Insufficient documentation

## 2013-08-20 DIAGNOSIS — Z8709 Personal history of other diseases of the respiratory system: Secondary | ICD-10-CM | POA: Insufficient documentation

## 2013-08-20 DIAGNOSIS — Y92410 Unspecified street and highway as the place of occurrence of the external cause: Secondary | ICD-10-CM | POA: Insufficient documentation

## 2013-08-20 DIAGNOSIS — Y9389 Activity, other specified: Secondary | ICD-10-CM | POA: Diagnosis not present

## 2013-08-20 DIAGNOSIS — IMO0002 Reserved for concepts with insufficient information to code with codable children: Secondary | ICD-10-CM | POA: Insufficient documentation

## 2013-08-20 DIAGNOSIS — S199XXA Unspecified injury of neck, initial encounter: Secondary | ICD-10-CM

## 2013-08-20 DIAGNOSIS — K219 Gastro-esophageal reflux disease without esophagitis: Secondary | ICD-10-CM | POA: Insufficient documentation

## 2013-08-20 DIAGNOSIS — M542 Cervicalgia: Secondary | ICD-10-CM

## 2013-08-20 DIAGNOSIS — M546 Pain in thoracic spine: Secondary | ICD-10-CM

## 2013-08-20 MED ORDER — ACETAMINOPHEN 325 MG PO TABS
650.0000 mg | ORAL_TABLET | Freq: Once | ORAL | Status: AC
  Administered 2013-08-20: 650 mg via ORAL
  Filled 2013-08-20: qty 2

## 2013-08-20 MED ORDER — IBUPROFEN 600 MG PO TABS: 600.0000 mg | ORAL_TABLET | Freq: Four times a day (QID) | ORAL | Status: DC | PRN

## 2013-08-20 NOTE — Discharge Instructions (Signed)
If you were given medicines take as directed.  If you are on coumadin or contraceptives realize their levels and effectiveness is altered by many different medicines.  If you have any reaction (rash, tongues swelling, other) to the medicines stop taking and see a physician.   Please follow up as directed and return to the ER or see a physician for new or worsening symptoms such as weakness, bowel or bladder changes, other.  Thank you. Filed Vitals:   08/20/13 1527  BP: 131/90  Pulse: 93  Temp: 97.7 F (36.5 C)  TempSrc: Oral  Resp: 14  Weight: 161 lb 5 oz (73.171 kg)  SpO2: 97%

## 2013-08-20 NOTE — ED Provider Notes (Signed)
  Physical Exam  BP 131/90  Pulse 93  Temp(Src) 97.7 F (36.5 C) (Oral)  Resp 14  Wt 161 lb 5 oz (73.171 kg)  SpO2 97%  LMP 04/09/2011  Physical Exam  ED Course  Procedures  MDM Sign out received from dr Reather Converse to dc home if x rays negative.  X rays show no acute abnormalities. Pt updated and comfortable with plan for dc home       Avie Arenas, MD 08/20/13 [phone]
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
## 2013-09-04 ENCOUNTER — Encounter: Payer: Self-pay | Admitting: Gastroenterology

## 2013-09-04 ENCOUNTER — Ambulatory Visit (INDEPENDENT_AMBULATORY_CARE_PROVIDER_SITE_OTHER): Payer: Medicaid Other | Admitting: Gastroenterology

## 2013-09-04 VITALS — BP 104/64 | HR 100 | Ht 63.5 in | Wt 160.0 lb

## 2013-09-04 DIAGNOSIS — K648 Other hemorrhoids: Secondary | ICD-10-CM

## 2013-09-04 NOTE — Patient Instructions (Signed)
HEMORRHOID BANDING PROCEDURE    FOLLOW-UP CARE   1. The procedure you have had should have been relatively painless since the banding of the area involved does not have nerve endings and there is no pain sensation.  The rubber band cuts off the blood supply to the hemorrhoid and the band may fall off as soon as 48 hours after the banding (the band may occasionally be seen in the toilet bowl following a bowel movement). You may notice a temporary feeling of fullness in the rectum which should respond adequately to plain Tylenol or Motrin.  2. Following the banding, avoid strenuous exercise that evening and resume full activity the next day.  A sitz bath (soaking in a warm tub) or bidet is soothing, and can be useful for cleansing the area after bowel movements.     3. To avoid constipation, take two tablespoons of natural wheat bran, natural oat bran, flax, Benefiber or any over the counter fiber supplement and increase your water intake to 7-8 glasses daily.    4. Unless you have been prescribed anorectal medication, do not put anything inside your rectum for two weeks: No suppositories, enemas, fingers, etc.  5. Occasionally, you may have more bleeding than usual after the banding procedure.  This is often from the untreated hemorrhoids rather than the treated one.  Don't be concerned if there is a tablespoon or so of blood.  If there is more blood than this, lie flat with your bottom higher than your head and apply an ice pack to the area. If the bleeding does not stop within a half an hour or if you feel faint, call our office at [phone] or go to the emergency room.  6. Problems are not common; however, if there is a substantial amount of bleeding, severe pain, chills, fever or difficulty passing urine (very rare) or other problems, you should call us at [phone] or report to the nearest emergency room.  7. Do not stay seated continuously for more than 2-3 hours for a day or two  after the procedure.  Tighten your buttock muscles 10-15 times every two hours and take 10-15 deep breaths every 1-2 hours.  Do not spend more than a few minutes on the toilet if you cannot empty your bowel; instead re-visit the toilet at a later time.   Your next scheduled follow up appt is scheduled on 11/06/2013 at 9:45am

## 2013-09-04 NOTE — Progress Notes (Signed)
PROCEDURE NOTE: The patient presents with symptomatic grade *2**  hemorrhoids, requesting rubber band ligation of his/her hemorrhoidal disease.  All risks, benefits and alternative forms of therapy were described and informed consent was obtained.   The anorectum was pre-medicated with lubricant and nitroglycerine ointment The decision was made to band the right*anterior** internal hemorrhoid, and the [facility] O'Regan System was used to perform band ligation without complication.  Digital anorectal examination was then performed to assure proper positioning of the band, and to adjust the banded tissue as required.  The patient was discharged home without pain or other issues.  Dietary and behavioral recommendations were given and along with follow-up instructions.    The patient will return in *4** weeks for  follow-up and possible additional banding as required. No complications were encountered and the patient tolerated the procedure well.   

## 2013-09-16 ENCOUNTER — Telehealth: Payer: Self-pay | Admitting: Gastroenterology

## 2013-09-17 NOTE — Telephone Encounter (Signed)
I have left a message for the patient to call back  

## 2013-09-17 NOTE — Telephone Encounter (Signed)
Patient reports nonstop rectal pain since banding. It is not getting worse. She has some bleeding, but states it is minimal. She has used cream, done soaks and taken pain medicine. Patient requests to be seen. Appointment scheduled.

## 2013-09-18 ENCOUNTER — Ambulatory Visit (INDEPENDENT_AMBULATORY_CARE_PROVIDER_SITE_OTHER): Payer: Medicaid Other | Admitting: Gastroenterology

## 2013-09-18 ENCOUNTER — Encounter: Payer: Self-pay | Admitting: Gastroenterology

## 2013-09-18 VITALS — BP 110/60 | HR 74 | Ht 63.5 in | Wt 157.6 lb

## 2013-09-18 DIAGNOSIS — K6289 Other specified diseases of anus and rectum: Secondary | ICD-10-CM

## 2013-09-18 MED ORDER — LIDOCAINE (ANORECTAL) 5 % EX CREA
1.0000 | TOPICAL_CREAM | Freq: Two times a day (BID) | CUTANEOUS | Status: DC
Start: 2013-09-18 — End: 2014-03-18

## 2013-09-18 NOTE — Progress Notes (Signed)
     09/18/2013 Laura Huerta [id number] March 04, 1965   History of Present Illness:  Patient is a 48 year old female who is known to Dr. Deatra Ina for recent in-office hemorrhoid bandings.  She had her third banding on 7/30.  She was added on for an urgent visit due to complaints of rectal pain since her banding.  She saw her PCP and was given some tylenol #3 according to her report.  Was having to sit on a pillow in her car.  She actually feels better today and had a BM without pain.     Current Medications, Allergies, Past Medical History, Past Surgical History, Family History and Social History were reviewed in [employer] record.   Physical Exam: BP 110/60  Pulse 74  Ht 5' 3.5" (1.613 m)  Wt 157 lb 9.6 oz (71.487 kg)  BMI 27.48 kg/m2  LMP 04/09/2011 General: Well developed black female in no acute distress Head: Normocephalic and atraumatic Eyes:  Sclerae anicteric, conjunctiva pink  Ears: Normal auditory acuity. Rectal:  DRE somewhat painful; no gross abnormalities noted.  Anoscopy attempted but not completed successfully due to complaints of pain. Musculoskeletal: Symmetrical with no gross deformities  Extremities: No edema  Neurological: Alert oriented x 4, grossly non-focal Psychological:  Alert and cooperative. Normal mood and affect  Assessment and Recommendations: -Rectal pain s/p third hemorrhoid banding 2 weeks ago:  Improved today overall.  Samples given of lidocaine cream (Recticare) to use prn for now, but reassurance given that pain should continue to improve.  Patient to call back if pain worsens.

## 2013-09-18 NOTE — Patient Instructions (Signed)
Please keep your appointment with Dr. Deatra Ina for November 06, 2013 at 945 am  Samples of Recti Care Cream given today for you to apply to your rectum twice daily

## 2013-09-19 NOTE — Progress Notes (Signed)
Reviewed and agree with management.  Would add nitroglycerin ointment Sandy Salaam. Deatra Ina, M.D., [facility]

## 2013-10-03 ENCOUNTER — Encounter (HOSPITAL_COMMUNITY): Payer: Self-pay | Admitting: Emergency Medicine

## 2013-10-03 ENCOUNTER — Emergency Department (HOSPITAL_COMMUNITY)
Admission: EM | Admit: 2013-10-03 | Discharge: 2013-10-03 | Disposition: A | Discharge status: Home / Self Care | Payer: Medicaid Other | Attending: Family Medicine | Admitting: Family Medicine

## 2013-10-03 ENCOUNTER — Emergency Department (HOSPITAL_COMMUNITY): Payer: Medicaid Other

## 2013-10-03 DIAGNOSIS — I10 Essential (primary) hypertension: Secondary | ICD-10-CM | POA: Insufficient documentation

## 2013-10-03 DIAGNOSIS — S8990XA Unspecified injury of unspecified lower leg, initial encounter: Secondary | ICD-10-CM | POA: Diagnosis present

## 2013-10-03 DIAGNOSIS — S59909A Unspecified injury of unspecified elbow, initial encounter: Secondary | ICD-10-CM | POA: Diagnosis not present

## 2013-10-03 DIAGNOSIS — S99929A Unspecified injury of unspecified foot, initial encounter: Secondary | ICD-10-CM

## 2013-10-03 DIAGNOSIS — S99919A Unspecified injury of unspecified ankle, initial encounter: Secondary | ICD-10-CM

## 2013-10-03 DIAGNOSIS — Z8709 Personal history of other diseases of the respiratory system: Secondary | ICD-10-CM | POA: Insufficient documentation

## 2013-10-03 DIAGNOSIS — S59919A Unspecified injury of unspecified forearm, initial encounter: Secondary | ICD-10-CM

## 2013-10-03 DIAGNOSIS — S8000XA Contusion of unspecified knee, initial encounter: Secondary | ICD-10-CM | POA: Diagnosis not present

## 2013-10-03 DIAGNOSIS — Y9289 Other specified places as the place of occurrence of the external cause: Secondary | ICD-10-CM | POA: Diagnosis not present

## 2013-10-03 DIAGNOSIS — F17200 Nicotine dependence, unspecified, uncomplicated: Secondary | ICD-10-CM | POA: Diagnosis not present

## 2013-10-03 DIAGNOSIS — Y9389 Activity, other specified: Secondary | ICD-10-CM | POA: Diagnosis not present

## 2013-10-03 DIAGNOSIS — Z79899 Other long term (current) drug therapy: Secondary | ICD-10-CM | POA: Diagnosis not present

## 2013-10-03 DIAGNOSIS — K219 Gastro-esophageal reflux disease without esophagitis: Secondary | ICD-10-CM | POA: Insufficient documentation

## 2013-10-03 DIAGNOSIS — S6990XA Unspecified injury of unspecified wrist, hand and finger(s), initial encounter: Secondary | ICD-10-CM

## 2013-10-03 DIAGNOSIS — W010XXA Fall on same level from slipping, tripping and stumbling without subsequent striking against object, initial encounter: Secondary | ICD-10-CM | POA: Diagnosis not present

## 2013-10-03 DIAGNOSIS — S8001XA Contusion of right knee, initial encounter: Secondary | ICD-10-CM

## 2013-10-03 MED ORDER — IBUPROFEN 800 MG PO TABS: 800.0000 mg | ORAL_TABLET | Freq: Three times a day (TID) | ORAL | Status: DC | PRN

## 2013-10-03 NOTE — ED Provider Notes (Signed)
CSN: [id number]     Arrival date & time 10/03/13  1346 History   First MD Initiated Contact with Patient 10/03/13 1449     Chief Complaint  Patient presents with  . Fall  . Leg Pain  . Arm Pain     (Consider location/radiation/quality/duration/timing/severity/associated sxs/prior Treatment) HPI Comments: 48 female presents for evaluation of right knee pain after falling on a wet floor at a grocery store earlier today. She was walking when she slipped and fell straight forward onto both knees and both hands. She is having the most significant pain in the medial anterior portion of the right knee, but is also experiencing some mild pain in the left knee and in both elbows. She denies any swelling. She has been ambulatory without difficulty. She experienced a very small abrasion on her right shin but that wound has already been cleaned and dressed, she is not worried about that. She denies any other injury. She did not hit her in. No loss of consciousness, NVD, blurry vision, headache.   Past Medical History  Diagnosis Date  . Acid reflux   . Bronchitis   . Hypertension    Past Surgical History  Procedure Laterality Date  . Cesarean section    . Tubal ligation    . Hemorrhoid banding  2015   Family History  Problem Relation Age of Onset  . Hypertension Mother   . Heart failure Mother     Died age 53  . Hypertension Father   . Diabetes Father   . Breast cancer Maternal Aunt    History  Substance Use Topics  . Smoking status: Current Every Day Smoker -- 2.00 packs/day for 10 years    Types: Cigarettes  . Smokeless tobacco: Never Used  . Alcohol Use: 1.2 oz/week    2 Cans of beer per week   OB History   Grav Para Term Preterm Abortions TAB SAB Ect Mult Living   7 2 2  5 5    2      Review of Systems  Musculoskeletal: Positive for arthralgias. Negative for joint swelling.       See history of present illness  Neurological: Negative for weakness and numbness.  All other  systems reviewed and are negative.     Allergies  Review of patient's allergies indicates no known allergies.  Home Medications   Prior to Admission medications   Medication Sig Start Date End Date Taking? Authorizing Provider  amLODipine-benazepril (LOTREL) 10-20 MG per capsule Take 1 capsule by mouth daily.   Yes Historical Provider, MD  buPROPion (WELLBUTRIN) 75 MG tablet Take 75 mg by mouth 2 (two) times daily.    Yes Historical Provider, MD  Lidocaine, Anorectal, (RECTICARE) 5 % CREA Apply 1 application topically 2 (two) times daily. 09/18/13  Yes Jessica D. Zehr, PA-C  omeprazole (PRILOSEC) 40 MG capsule Take 1 capsule (40 mg total) by mouth daily. 05/09/12  Yes Rosana Hoes, MD  ibuprofen (ADVIL,MOTRIN) 800 MG tablet Take 1 tablet (800 mg total) by mouth every 8 (eight) hours as needed. 10/03/13   Liam Graham, PA-C   BP 113/74  Pulse 92  Temp(Src) 97.5 F (36.4 C) (Oral)  Resp 18  SpO2 99%  LMP 04/09/2011 Physical Exam  Nursing note and vitals reviewed. Constitutional: She is oriented to person, place, and time. Vital signs are normal. She appears well-developed and well-nourished. No distress.  HENT:  Head: Normocephalic and atraumatic.  Pulmonary/Chest: Effort normal. No respiratory distress.  Musculoskeletal:  Right elbow: Normal.      Left elbow: Normal.       Right wrist: Normal.       Left wrist: Normal.       Right knee: She exhibits ecchymosis and erythema. She exhibits normal range of motion, no swelling, no effusion, no deformity, no LCL laxity, normal patellar mobility, no bony tenderness and no MCL laxity. Tenderness found. Medial joint line (minimal, with minimal erythema and ecchymosis) tenderness noted.       Left knee: Normal.  Neurological: She is alert and oriented to person, place, and time. She has normal strength. Coordination normal.  Skin: Skin is warm and dry. No rash noted. She is not diaphoretic.  Psychiatric: She has a normal mood and  affect. Judgment normal.    ED Course  Procedures (including critical care time) Labs Review Labs Reviewed - No data to display  Imaging Review Dg Knee Complete 4 Views Left  10/03/2013   CLINICAL DATA:  Fall.  Knee pain.  EXAM: LEFT KNEE - COMPLETE 4+ VIEW  COMPARISON:  None.  FINDINGS: There is no evidence of fracture, dislocation, or joint effusion. There is no evidence of arthropathy or other focal bone abnormality. Soft tissues are unremarkable.  IMPRESSION: Negative.   Electronically Signed   By: Rolm Baptise M.D.   On: 10/03/2013 15:13   Dg Knee Complete 4 Views Right  10/03/2013   CLINICAL DATA:  Fall, knee pain.  EXAM: RIGHT KNEE - COMPLETE 4+ VIEW  COMPARISON:  None.  FINDINGS: There is no evidence of fracture, dislocation, or joint effusion. There is no evidence of arthropathy or other focal bone abnormality. Soft tissues are unremarkable.  IMPRESSION: Negative.   Electronically Signed   By: Rolm Baptise M.D.   On: 10/03/2013 15:13     EKG Interpretation None      MDM   Final diagnoses:  Contusion, knee, right, initial encounter    X-rays are negative and exam is normal except for a slight contusion. Treat with ibuprofen, ice, rest. Followup as needed   Meds ordered this encounter  Medications  . ibuprofen (ADVIL,MOTRIN) 800 MG tablet    Sig: Take 1 tablet (800 mg total) by mouth every 8 (eight) hours as needed.    Dispense:  20 tablet    Refill:  0    Order Specific Question:  Supervising Provider    Answer:  Ihor Gully D [[id number]]     Liam Graham, PA-C 10/03/13 1712

## 2013-10-03 NOTE — Discharge Instructions (Signed)
Contusion °A contusion is a deep bruise. Contusions are the result of an injury that caused bleeding under the skin. The contusion may turn blue, purple, or yellow. Minor injuries will give you a painless contusion, but more severe contusions may stay painful and swollen for a few weeks.  °CAUSES  °A contusion is usually caused by a blow, trauma, or direct force to an area of the body. °SYMPTOMS  °· Swelling and redness of the injured area. °· Bruising of the injured area. °· Tenderness and soreness of the injured area. °· Pain. °DIAGNOSIS  °The diagnosis can be made by taking a history and physical exam. An X-ray, CT scan, or MRI may be needed to determine if there were any associated injuries, such as fractures. °TREATMENT  °Specific treatment will depend on what area of the body was injured. In general, the best treatment for a contusion is resting, icing, elevating, and applying cold compresses to the injured area. Over-the-counter medicines may also be recommended for pain control. Ask your caregiver what the best treatment is for your contusion. °HOME CARE INSTRUCTIONS  °· Put ice on the injured area. °¨ Put ice in a plastic bag. °¨ Place a towel between your skin and the bag. °¨ Leave the ice on for 15-20 minutes, 3-4 times a day, or as directed by your health care provider. °· Only take over-the-counter or prescription medicines for pain, discomfort, or fever as directed by your caregiver. Your caregiver may recommend avoiding anti-inflammatory medicines (aspirin, ibuprofen, and naproxen) for 48 hours because these medicines may increase bruising. °· Rest the injured area. °· If possible, elevate the injured area to reduce swelling. °SEEK IMMEDIATE MEDICAL CARE IF:  °· You have increased bruising or swelling. °· You have pain that is getting worse. °· Your swelling or pain is not relieved with medicines. °MAKE SURE YOU:  °· Understand these instructions. °· Will watch your condition. °· Will get help right  away if you are not doing well or get worse. °Document Released: 11/02/2004 Document Revised: 01/28/2013 Document Reviewed: 11/28/2010 °[employer]® Patient Information ©2015 [employer]. This information is not intended to replace advice given to you by your health care provider. Make sure you discuss any questions you have with your health care provider. ° °

## 2013-10-03 NOTE — ED Notes (Signed)
Pt c/o right arm and leg pain after falling today; pt denies LOC or hitting head

## 2013-10-07 NOTE — ED Provider Notes (Signed)
Medical screening examination/treatment/procedure(s) were performed by resident physician or non-physician practitioner and as supervising physician I was immediately available for consultation/collaboration.   Pauline Good MD.   Billy Fischer, MD 10/07/13 [phone]

## 2013-11-06 ENCOUNTER — Ambulatory Visit: Payer: Medicaid Other | Admitting: Gastroenterology

## 2013-12-08 ENCOUNTER — Encounter (HOSPITAL_COMMUNITY): Payer: Self-pay | Admitting: Emergency Medicine

## 2013-12-16 ENCOUNTER — Other Ambulatory Visit (HOSPITAL_COMMUNITY): Payer: Self-pay | Admitting: Family Medicine

## 2013-12-16 DIAGNOSIS — Z1231 Encounter for screening mammogram for malignant neoplasm of breast: Secondary | ICD-10-CM

## 2013-12-23 ENCOUNTER — Ambulatory Visit (HOSPITAL_COMMUNITY)
Admission: RE | Admit: 2013-12-23 | Discharge: 2013-12-23 | Disposition: A | Discharge status: Home / Self Care | Payer: Medicaid Other | Source: Ambulatory Visit | Attending: Family Medicine | Admitting: Family Medicine

## 2013-12-23 DIAGNOSIS — Z1231 Encounter for screening mammogram for malignant neoplasm of breast: Secondary | ICD-10-CM | POA: Diagnosis not present

## 2014-01-07 ENCOUNTER — Ambulatory Visit (INDEPENDENT_AMBULATORY_CARE_PROVIDER_SITE_OTHER): Payer: Self-pay | Admitting: Gastroenterology

## 2014-01-07 ENCOUNTER — Encounter: Payer: Self-pay | Admitting: Gastroenterology

## 2014-01-07 VITALS — BP 98/60 | HR 84 | Ht 63.0 in | Wt 155.8 lb

## 2014-01-07 DIAGNOSIS — K648 Other hemorrhoids: Secondary | ICD-10-CM

## 2014-03-17 NOTE — Progress Notes (Signed)
In here for follow-up following band ligation of internal hemorrhoids.  Patient offers no lower GI complaints including pain, bleeding or pruritus.

## 2014-03-17 NOTE — Assessment & Plan Note (Signed)
Excellent response to band ligation.  Plan follow-up as needed

## 2014-03-18 ENCOUNTER — Emergency Department (HOSPITAL_COMMUNITY)
Admission: EM | Admit: 2014-03-18 | Discharge: 2014-03-18 | Disposition: A | Discharge status: Home / Self Care | Payer: Medicaid Other | Attending: Family Medicine | Admitting: Family Medicine

## 2014-03-18 ENCOUNTER — Encounter (HOSPITAL_COMMUNITY): Payer: Self-pay | Admitting: *Deleted

## 2014-03-18 DIAGNOSIS — F17200 Nicotine dependence, unspecified, uncomplicated: Secondary | ICD-10-CM

## 2014-03-18 DIAGNOSIS — M940 Chondrocostal junction syndrome [Tietze]: Secondary | ICD-10-CM | POA: Diagnosis not present

## 2014-03-18 DIAGNOSIS — R0789 Other chest pain: Secondary | ICD-10-CM

## 2014-03-18 DIAGNOSIS — Z720 Tobacco use: Secondary | ICD-10-CM

## 2014-03-18 DIAGNOSIS — J40 Bronchitis, not specified as acute or chronic: Secondary | ICD-10-CM | POA: Diagnosis not present

## 2014-03-18 DIAGNOSIS — J9801 Acute bronchospasm: Secondary | ICD-10-CM | POA: Diagnosis not present

## 2014-03-18 MED ORDER — IPRATROPIUM-ALBUTEROL 0.5-2.5 (3) MG/3ML IN SOLN
3.0000 mL | Freq: Once | RESPIRATORY_TRACT | Status: AC
  Administered 2014-03-18: 3 mL via RESPIRATORY_TRACT

## 2014-03-18 MED ORDER — KETOROLAC TROMETHAMINE 30 MG/ML IJ SOLN
INTRAMUSCULAR | Status: AC
  Filled 2014-03-18: qty 1

## 2014-03-18 MED ORDER — KETOROLAC TROMETHAMINE 30 MG/ML IJ SOLN
30.0000 mg | Freq: Once | INTRAMUSCULAR | Status: AC
  Administered 2014-03-18: 30 mg via INTRAMUSCULAR

## 2014-03-18 MED ORDER — ALBUTEROL SULFATE (2.5 MG/3ML) 0.083% IN NEBU
2.5000 mg | INHALATION_SOLUTION | Freq: Once | RESPIRATORY_TRACT | Status: AC
  Administered 2014-03-18: 2.5 mg via RESPIRATORY_TRACT

## 2014-03-18 MED ORDER — ALBUTEROL SULFATE HFA 108 (90 BASE) MCG/ACT IN AERS: 2.0000 | INHALATION_SPRAY | RESPIRATORY_TRACT | Status: DC | PRN

## 2014-03-18 MED ORDER — ALBUTEROL SULFATE (2.5 MG/3ML) 0.083% IN NEBU
INHALATION_SOLUTION | RESPIRATORY_TRACT | Status: AC
  Filled 2014-03-18: qty 3

## 2014-03-18 MED ORDER — PREDNISONE 20 MG PO TABS: ORAL_TABLET | ORAL | Status: DC

## 2014-03-18 MED ORDER — LEVOFLOXACIN 500 MG PO TABS: 500.0000 mg | ORAL_TABLET | Freq: Every day | ORAL | Status: DC

## 2014-03-18 MED ORDER — IPRATROPIUM-ALBUTEROL 0.5-2.5 (3) MG/3ML IN SOLN
RESPIRATORY_TRACT | Status: AC
  Filled 2014-03-18: qty 3

## 2014-03-18 NOTE — Discharge Instructions (Signed)
Bronchospasm A bronchospasm is when the tubes that carry air in and out of your lungs (airways) spasm or tighten. During a bronchospasm it is hard to breathe. This is because the airways get smaller. A bronchospasm can be triggered by: 1. Allergies. These may be to animals, pollen, food, or mold. 2. Infection. This is a common cause of bronchospasm. 3. Exercise. 4. Irritants. These include pollution, cigarette smoke, strong odors, aerosol sprays, and paint fumes. 5. Weather changes. 6. Stress. 7. Being emotional. HOME CARE   Always have a plan for getting help. Know when to call your doctor and local emergency services (911 in the [place] Know where you can get emergency care.  Only take medicines as told by your doctor.  If you were prescribed an inhaler or nebulizer machine, ask your doctor how to use it correctly. Always use a spacer with your inhaler if you were given one.  Stay calm during an attack. Try to relax and breathe more slowly.  Control your home environment:  Change your heating and air conditioning filter at least once a month.  Limit your use of fireplaces and wood stoves.  Do not  smoke. Do not  allow smoking in your home.  Avoid perfumes and fragrances.  Get rid of pests (such as roaches and mice) and their droppings.  Throw away plants if you see mold on them.  Keep your house clean and dust free.  Replace carpet with wood, tile, or vinyl flooring. Carpet can trap dander and dust.  Use allergy-proof pillows, mattress covers, and box spring covers.  Wash bed sheets and blankets every week in hot water. Dry them in a dryer.  Use blankets that are made of polyester or cotton.  Wash hands frequently. GET HELP IF:  You have muscle aches.  You have chest pain.  The thick spit you spit or cough up (sputum) changes from clear or white to yellow, green, gray, or bloody.  The thick spit you spit or cough up gets thicker.  There are problems that may be  related to the medicine you are given such as:  A rash.  Itching.  Swelling.  Trouble breathing. GET HELP RIGHT AWAY IF:  You feel you cannot breathe or catch your breath.  You cannot stop coughing.  Your treatment is not helping you breathe better.  You have very bad chest pain. MAKE SURE YOU:   Understand these instructions.  Will watch your condition.  Will get help right away if you are not doing well or get worse. Document Released: 11/20/2008 Document Revised: 01/28/2013 Document Reviewed: 07/16/2012 [facility] Patient Information 2015 [place], [place]. This information is not intended to replace advice given to you by your health care provider. Make sure you discuss any questions you have with your health care provider.  Chest Wall Pain Chest wall pain is pain felt in or around the chest bones and muscles. It may take up to 6 weeks to get better. It may take longer if you are active. Chest wall pain can happen on its own. Other times, things like germs, injury, coughing, or exercise can cause the pain. HOME CARE  8. Avoid activities that make you tired or cause pain. Try not to use your chest, belly (abdominal), or side muscles. Do not use heavy weights. 9. Put ice on the sore area. 1. Put ice in a plastic bag. 2. Place a towel between your skin and the bag. 3. Leave the ice on for 15-20 minutes for the  first 2 days. 10. Only take medicine as told by your doctor. GET HELP RIGHT AWAY IF:   You have more pain or are very uncomfortable.  You have a fever.  Your chest pain gets worse.  You have new problems.  You feel sick to your stomach (nauseous) or throw up (vomit).  You start to sweat or feel lightheaded.  You have a cough with mucus (phlegm).  You cough up blood. MAKE SURE YOU:   Understand these instructions.  Will watch your condition.  Will get help right away if you are not doing well or get worse. Document Released: 07/12/2007 Document Revised:  04/17/2011 Document Reviewed: 09/19/2010 [facility] Patient Information 2015 [place], [place]. This information is not intended to replace advice given to you by your health care provider. Make sure you discuss any questions you have with your health care provider.  Costochondritis Costochondritis, sometimes called Tietze syndrome, is a swelling and irritation (inflammation) of the tissue (cartilage) that connects your ribs with your breastbone (sternum). It causes pain in the chest and rib area. Costochondritis usually goes away on its own over time. It can take up to 6 weeks or longer to get better, especially if you are unable to limit your activities. CAUSES  Some cases of costochondritis have no known cause. Possible causes include: 11. Injury (trauma). 12. Exercise or activity such as lifting. 13. Severe coughing. SIGNS AND SYMPTOMS  Pain and tenderness in the chest and rib area.  Pain that gets worse when coughing or taking deep breaths.  Pain that gets worse with specific movements. DIAGNOSIS  Your health care provider will do a physical exam and ask about your symptoms. Chest X-rays or other tests may be done to rule out other problems. TREATMENT  Costochondritis usually goes away on its own over time. Your health care provider may prescribe medicine to help relieve pain. HOME CARE INSTRUCTIONS   Avoid exhausting physical activity. Try not to strain your ribs during normal activity. This would include any activities using chest, abdominal, and side muscles, especially if heavy weights are used.  Apply ice to the affected area for the first 2 days after the pain begins.  Put ice in a plastic bag.  Place a towel between your skin and the bag.  Leave the ice on for 20 minutes, 2-3 times a day.  Only take over-the-counter or prescription medicines as directed by your health care provider. SEEK MEDICAL CARE IF:  You have redness or swelling at the rib joints. These are signs of  infection.  Your pain does not go away despite rest or medicine. SEEK IMMEDIATE MEDICAL CARE IF:   Your pain increases or you are very uncomfortable.  You have shortness of breath or difficulty breathing.  You cough up blood.  You have worse chest pains, sweating, or vomiting.  You have a fever or persistent symptoms for more than 2-3 days.  You have a fever and your symptoms suddenly get worse. MAKE SURE YOU:   Understand these instructions.  Will watch your condition.  Will get help right away if you are not doing well or get worse. Document Released: 11/02/2004 Document Revised: 11/13/2012 Document Reviewed: 08/27/2012 [facility] Patient Information 2015 [place], [place]. This information is not intended to replace advice given to you by your health care provider. Make sure you discuss any questions you have with your health care provider.  How to Use an Inhaler Using your inhaler correctly is very important. Good technique will make sure that  the medicine reaches your lungs.  HOW TO USE AN INHALER: 14. Take the cap off the inhaler. 15. If this is the first time using your inhaler, you need to prime it. Shake the inhaler for 5 seconds. Release four puffs into the air, away from your face. Ask your doctor for help if you have questions. 16. Shake the inhaler for 5 seconds. 17. Turn the inhaler so the bottle is above the mouthpiece. 18. Put your pointer finger on top of the bottle. Your thumb holds the bottom of the inhaler. 19. Open your mouth. 20. Either hold the inhaler away from your mouth (the width of 2 fingers) or place your lips tightly around the mouthpiece. Ask your doctor which way to use your inhaler. 21. Breathe out as much air as possible. 22. Breathe in and push down on the bottle 1 time to release the medicine. You will feel the medicine go in your mouth and throat. 23. Continue to take a deep breath in very slowly. Try to fill your lungs. 24. After you have  breathed in completely, hold your breath for 10 seconds. This will help the medicine to settle in your lungs. If you cannot hold your breath for 10 seconds, hold it for as long as you can before you breathe out. 25. Breathe out slowly, through pursed lips. Whistling is an example of pursed lips. 26. If your doctor has told you to take more than 1 puff, wait at least 15-30 seconds between puffs. This will help you get the best results from your medicine. Do not use the inhaler more than your doctor tells you to. 27. Put the cap back on the inhaler. 28. Follow the directions from your doctor or from the inhaler package about cleaning the inhaler. If you use more than one inhaler, ask your doctor which inhalers to use and what order to use them in. Ask your doctor to help you figure out when you will need to refill your inhaler.  If you use a steroid inhaler, always rinse your mouth with water after your last puff, gargle and spit out the water. Do not swallow the water. GET HELP IF:  The inhaler medicine only partially helps to stop wheezing or shortness of breath.  You are having trouble using your inhaler.  You have some increase in thick spit (phlegm). GET HELP RIGHT AWAY IF:  The inhaler medicine does not help your wheezing or shortness of breath or you have tightness in your chest.  You have dizziness, headaches, or fast heart rate.  You have chills, fever, or night sweats.  You have a large increase of thick spit, or your thick spit is bloody. MAKE SURE YOU:   Understand these instructions.  Will watch your condition.  Will get help right away if you are not doing well or get worse. Document Released: 11/02/2007 Document Revised: 11/13/2012 Document Reviewed: 08/22/2012 [facility] Patient Information 2015 [place], [place]. This information is not intended to replace advice given to you by your health care provider. Make sure you discuss any questions you have with your health care  provider.

## 2014-03-18 NOTE — ED Notes (Signed)
C/o mid chest pain onset Monday. Pain off and on.  Pain worse when she lays on her L side.  C/o SOB, hurts to breathe and cough.  No nausea or sweating.  Started coughing today.

## 2014-03-18 NOTE — ED Provider Notes (Signed)
CSN: [id number]     Arrival date & time 03/18/14  1821 History   First MD Initiated Contact with Patient 03/18/14 1908     Chief Complaint  Patient presents with  . Chest Pain   (Consider location/radiation/quality/duration/timing/severity/associated sxs/prior Treatment) HPI Comments: 49 year old female complaining of pain to the middle of the anterior chest. She states it is "sore feeling" in her chest and ribs when she takes a deep breath. It started about 3 days ago. She occasionally has some shortness of breath. It is intermittent. She denies fever. She is a daily smoker.      Past Medical History  Diagnosis Date  . Acid reflux   . Bronchitis   . Hypertension    Past Surgical History  Procedure Laterality Date  . Cesarean section    . Tubal ligation    . Hemorrhoid banding  2015   Family History  Problem Relation Age of Onset  . Hypertension Mother   . Heart failure Mother     Died age 55  . Hypertension Father   . Diabetes Father   . Breast cancer Maternal Aunt    History  Substance Use Topics  . Smoking status: Current Every Day Smoker -- 2.00 packs/day for 10 years    Types: Cigarettes  . Smokeless tobacco: Never Used  . Alcohol Use: 1.2 oz/week    2 Cans of beer per week   OB History    Gravida Para Term Preterm AB TAB SAB Ectopic Multiple Living   7 2 2  5 5    2      Review of Systems  Constitutional: Positive for activity change. Negative for fever and fatigue.  HENT: Negative.   Respiratory: Positive for cough and shortness of breath. Negative for chest tightness.        No coughing observed during examination and history taking.  Cardiovascular: Positive for chest pain. Negative for palpitations and leg swelling.       The pain is located at the right parasternal border in the right anterior lateral chest.  Gastrointestinal: Negative.   Genitourinary: Negative.   Skin: Negative.   Neurological: Negative.     Allergies  Review of patient's  allergies indicates no known allergies.  Home Medications   Prior to Admission medications   Medication Sig Start Date End Date Taking? Authorizing Provider  amLODipine-benazepril (LOTREL) 10-20 MG per capsule Take 1 capsule by mouth daily.   Yes Historical Provider, MD  buPROPion (WELLBUTRIN) 75 MG tablet Take 75 mg by mouth 2 (two) times daily.    Yes Historical Provider, MD  famotidine-calcium carbonate-magnesium hydroxide (PEPCID COMPLETE) 10-800-165 MG CHEW chewable tablet Chew 1 tablet by mouth daily as needed.   Yes Historical Provider, MD  omeprazole (PRILOSEC) 40 MG capsule Take 1 capsule (40 mg total) by mouth daily. 05/09/12  Yes Rosana Hoes, MD  albuterol (PROVENTIL HFA;VENTOLIN HFA) 108 (90 BASE) MCG/ACT inhaler Inhale 2 puffs into the lungs every 4 (four) hours as needed for wheezing or shortness of breath. 03/18/14   Janne Napoleon, NP  levofloxacin (LEVAQUIN) 500 MG tablet Take 1 tablet (500 mg total) by mouth daily. 03/18/14   Janne Napoleon, NP  predniSONE (DELTASONE) 20 MG tablet Take 3 tabs po on first day, 2 tabs second day, 2 tabs third day, 1 tab fourth day, 1 tab 5th day. Take with food. 03/18/14   Janne Napoleon, NP   BP 124/75 mmHg  Pulse 80  Temp(Src) 98.4 F (36.9 C) (Oral)  Resp  20  SpO2 98%  LMP 04/09/2011 Physical Exam  Constitutional: She is oriented to person, place, and time. She appears well-developed and well-nourished. No distress.  Eyes: Conjunctivae and EOM are normal.  Neck: Normal range of motion. Neck supple.  Cardiovascular: Normal rate, regular rhythm, normal heart sounds and intact distal pulses.   Pulmonary/Chest: Effort normal. She exhibits tenderness.  Slightly prolonged expiratory phase. Mild diffuse coarseness bilaterally with forced expiration. No crackles or wheezes.  There is moderate to severe chest wall tenderness reproducible with palpation to the right sternal border and right anterior chest. The patient assures me that this is the exact, St. pain  for which she developed 3 days ago but is worse when I press on her chest.  Abdominal: Soft. There is no tenderness.  Musculoskeletal: She exhibits no edema.  Lymphadenopathy:    She has no cervical adenopathy.  Neurological: She is alert and oriented to person, place, and time.  Skin: Skin is warm and dry.  Psychiatric: She has a normal mood and affect.  Nursing note and vitals reviewed.   ED Course  Procedures (including critical care time) Labs Review Labs Reviewed - No data to display  Imaging Review No results found. EKG: Normal sinus rhythm, no ST-T changes, normal axis, heart rate 82.  MDM   1. Chest wall pain   2. Costochondritis, acute   3. Smoking   4. Bronchospasm   5. Bronchitis     Post DuoNeb patient states she is feeling better and breathing better. No coughing. There is increased air movement and significant decrease in coarseness. She was discharged with albuterol HFA, Levaquin, prednisone taper dose. Stop smoking Go to the hospital call EMS if having chest pain, heaviness, tightness, pressure or increasing shortness of breath, fever or worsening in anyway. At time of discharge patient is asking for pain medicine for her chest. Prescribed Toradol 30 mg IM and make a home.    Janne Napoleon, NP 03/18/14 Patrecia Pour  Janne Napoleon, NP 03/18/14 2006

## 2014-04-08 ENCOUNTER — Emergency Department (HOSPITAL_COMMUNITY)
Admission: EM | Admit: 2014-04-08 | Discharge: 2014-04-09 | Disposition: A | Discharge status: Home / Self Care | Payer: Medicaid Other | Attending: Emergency Medicine | Admitting: Emergency Medicine

## 2014-04-08 ENCOUNTER — Encounter (HOSPITAL_COMMUNITY): Payer: Self-pay

## 2014-04-08 ENCOUNTER — Emergency Department (HOSPITAL_COMMUNITY): Payer: Medicaid Other

## 2014-04-08 DIAGNOSIS — Z720 Tobacco use: Secondary | ICD-10-CM | POA: Diagnosis not present

## 2014-04-08 DIAGNOSIS — Y998 Other external cause status: Secondary | ICD-10-CM | POA: Insufficient documentation

## 2014-04-08 DIAGNOSIS — X58XXXA Exposure to other specified factors, initial encounter: Secondary | ICD-10-CM | POA: Insufficient documentation

## 2014-04-08 DIAGNOSIS — I10 Essential (primary) hypertension: Secondary | ICD-10-CM | POA: Diagnosis not present

## 2014-04-08 DIAGNOSIS — Y929 Unspecified place or not applicable: Secondary | ICD-10-CM | POA: Insufficient documentation

## 2014-04-08 DIAGNOSIS — Y939 Activity, unspecified: Secondary | ICD-10-CM | POA: Insufficient documentation

## 2014-04-08 DIAGNOSIS — R09A2 Foreign body sensation, throat: Secondary | ICD-10-CM

## 2014-04-08 DIAGNOSIS — Z8709 Personal history of other diseases of the respiratory system: Secondary | ICD-10-CM | POA: Diagnosis not present

## 2014-04-08 DIAGNOSIS — T189XXA Foreign body of alimentary tract, part unspecified, initial encounter: Secondary | ICD-10-CM | POA: Diagnosis not present

## 2014-04-08 DIAGNOSIS — Z79899 Other long term (current) drug therapy: Secondary | ICD-10-CM | POA: Insufficient documentation

## 2014-04-08 DIAGNOSIS — R198 Other specified symptoms and signs involving the digestive system and abdomen: Secondary | ICD-10-CM

## 2014-04-08 DIAGNOSIS — K219 Gastro-esophageal reflux disease without esophagitis: Secondary | ICD-10-CM | POA: Diagnosis not present

## 2014-04-08 DIAGNOSIS — K228 Other specified diseases of esophagus: Secondary | ICD-10-CM

## 2014-04-08 NOTE — Discharge Instructions (Signed)
Return to the emergency department if you develop severe abdominal pain, vomiting, or other new and concerning symptoms.   Swallowed Foreign Body, Adult You have swallowed an object (foreign body). Once the foreign body has passed through the food tube (esophagus), which leads from the mouth to the stomach, it will usually continue through the body without problems. This is because the point where the esophagus enters into the stomach is the narrowest place through which the foreign body must pass. Sometimes the foreign body gets stuck. The most common type of foreign body obstruction in adults is food impaction. Many times, bones from fish or meat products may become lodged in the esophagus or injure the throat on the way down. When there is an object that obstructs the esophagus, the most obvious symptoms are pain and the inability to swallow normally. In some cases, foreign bodies that can be life threatening are swallowed. Examples of these are certain medications and illicit drugs. Often in these instances, patients are afraid of telling what they swallowed. However, it is extremely important to tell the emergency caregiver what was swallowed because life-saving treatment may be needed.  X-ray exams may be taken to find the location of the foreign body. However, some objects do not show up well or may be too small to be seen on an X-ray image. If the foreign body is too large or too sharp, it may be too dangerous to allow it to pass on its own. You may need to see a caregiver who specializes in the digestive system (gastroenterologist). In a few cases, a specialist may need to remove the object using a method called "endoscopy". This involves passing a thin, soft, flexible tube into the food pipe to locate and remove the object. Follow up with your primary doctor or the referral you were given by the emergency caregiver. HOME CARE INSTRUCTIONS   If your caregiver says it is safe for you to eat, then  only have liquids and soft foods until your symptoms improve.  Once you are eating normally:  Cut food into small pieces.  Remove small bones from food.  Remove large seeds and pits from fruit.  Chew your food well.  Do not talk, laugh, or engage in physical activity while eating or swallowing. SEEK MEDICAL CARE IF:  You develop worsening shortness of breath, uncontrollable coughing, chest pains or high fever, greater than 102 F (38.9 C).  You are unable to eat or drink or you feel that food is getting stuck in your throat.  You have choking symptoms or cannot stop drooling.  You develop abdominal pain, vomiting (especially of blood), or rectal bleeding. MAKE SURE YOU:   Understand these instructions.  Will watch your condition.  Will get help right away if you are not doing well or get worse. Document Released: 07/13/2009 Document Revised: 04/17/2011 Document Reviewed: 07/13/2009 [facility] Patient Information 2015 [place], [place]. This information is not intended to replace advice given to you by your health care provider. Make sure you discuss any questions you have with your health care provider.

## 2014-04-08 NOTE — ED Notes (Signed)
MD at bedside. 

## 2014-04-08 NOTE — ED Notes (Signed)
Pt. Reports she swallowed a "can top", states "feels like something is stuck in my throat, but it always feels like something is stuck in my throat so I'm not sure what it is". Airway intact, denies SOB.

## 2014-04-09 NOTE — ED Notes (Signed)
Answered all patients questions about how the can top will pass through into stool. Covered the discharge instructions and teach back method was used.

## 2014-04-09 NOTE — ED Provider Notes (Signed)
CSN: [id number]     Arrival date & time 04/08/14  2125 History   First MD Initiated Contact with Patient 04/08/14 2324     Chief Complaint  Patient presents with  . Swallowed Foreign Body     (Consider location/radiation/quality/duration/timing/severity/associated sxs/prior Treatment) HPI Comments: Patient is a 49 year old female who presents for evaluation after swallowing the pull tab from an aluminum can. She feels as though it may still be stuck in her throat. She denies any difficulty breathing or swallowing. She denies any abdominal pain.  Patient is a 49 y.o. female presenting with foreign body swallowed. The history is provided by the patient.  Swallowed Foreign Body This is a new problem. The current episode started 1 to 2 hours ago. The problem occurs constantly. The problem has not changed since onset.Pertinent negatives include no abdominal pain. Nothing aggravates the symptoms. Nothing relieves the symptoms.    Past Medical History  Diagnosis Date  . Acid reflux   . Bronchitis   . Hypertension    Past Surgical History  Procedure Laterality Date  . Cesarean section    . Tubal ligation    . Hemorrhoid banding  2015   Family History  Problem Relation Age of Onset  . Hypertension Mother   . Heart failure Mother     Died age 53  . Hypertension Father   . Diabetes Father   . Breast cancer Maternal Aunt    History  Substance Use Topics  . Smoking status: Current Every Day Smoker -- 2.00 packs/day for 10 years    Types: Cigarettes  . Smokeless tobacco: Never Used  . Alcohol Use: 1.2 oz/week    2 Cans of beer per week   OB History    Gravida Para Term Preterm AB TAB SAB Ectopic Multiple Living   7 2 2  5 5    2      Review of Systems  Gastrointestinal: Negative for abdominal pain.  All other systems reviewed and are negative.     Allergies  Review of patient's allergies indicates no known allergies.  Home Medications   Prior to Admission medications    Medication Sig Start Date End Date Taking? Authorizing Provider  albuterol (PROVENTIL HFA;VENTOLIN HFA) 108 (90 BASE) MCG/ACT inhaler Inhale 2 puffs into the lungs every 4 (four) hours as needed for wheezing or shortness of breath. 03/18/14   Janne Napoleon, NP  amLODipine-benazepril (LOTREL) 10-20 MG per capsule Take 1 capsule by mouth daily.    Historical Provider, MD  buPROPion (WELLBUTRIN) 75 MG tablet Take 75 mg by mouth 2 (two) times daily.     Historical Provider, MD  famotidine-calcium carbonate-magnesium hydroxide (PEPCID COMPLETE) 10-800-165 MG CHEW chewable tablet Chew 1 tablet by mouth daily as needed.    Historical Provider, MD  levofloxacin (LEVAQUIN) 500 MG tablet Take 1 tablet (500 mg total) by mouth daily. 03/18/14   Janne Napoleon, NP  omeprazole (PRILOSEC) 40 MG capsule Take 1 capsule (40 mg total) by mouth daily. 05/09/12   Rosana Hoes, MD  predniSONE (DELTASONE) 20 MG tablet Take 3 tabs po on first day, 2 tabs second day, 2 tabs third day, 1 tab fourth day, 1 tab 5th day. Take with food. 03/18/14   Janne Napoleon, NP   BP 136/92 mmHg  Pulse 87  Temp(Src) 98 F (36.7 C) (Oral)  Resp 16  SpO2 98%  LMP 04/09/2011 Physical Exam  Constitutional: She is oriented to person, place, and time. She appears well-developed and well-nourished. No distress.  HENT:  Head: Normocephalic and atraumatic.  Neck: Normal range of motion. Neck supple.  Cardiovascular: Normal rate and regular rhythm.  Exam reveals no gallop and no friction rub.   No murmur heard. Pulmonary/Chest: Effort normal and breath sounds normal. No respiratory distress. She has no wheezes.  Abdominal: Soft. Bowel sounds are normal. She exhibits no distension. There is no tenderness.  Musculoskeletal: Normal range of motion.  Neurological: She is alert and oriented to person, place, and time.  Skin: Skin is warm and dry. She is not diaphoretic.  Nursing note and vitals reviewed.   ED Course  Procedures (including critical care  time) Labs Review Labs Reviewed - No data to display  Imaging Review Dg Neck Soft Tissue  04/08/2014   CLINICAL DATA:  Swallowed the tab off of a can of Coke. Initial encounter.  EXAM: NECK SOFT TISSUES - 1+ VIEW  COMPARISON:  Cervical spine radiographs performed 08/20/2013  FINDINGS: No radiopaque foreign bodies are seen.  The prevertebral soft tissues are within normal limits. The nasopharynx, oropharynx and hypopharynx are unremarkable. The epiglottis is normal in thickness. The proximal trachea is unremarkable.  No acute osseous abnormalities are identified. The visualized lung apices are clear.  IMPRESSION: No radiopaque foreign bodies seen.   Electronically Signed   By: Garald Balding M.D.   On: 04/08/2014 23:01   Dg Abd Acute W/chest  04/08/2014   CLINICAL DATA:  Swallowed tab off of a can of Coke. Initial encounter.  EXAM: ACUTE ABDOMEN SERIES (ABDOMEN 2 VIEW & CHEST 1 VIEW)  COMPARISON:  Chest radiograph performed 09/06/2012, and CT of the abdomen and pelvis from 07/17/2012  FINDINGS: A thin focus of slightly increased density has the appearance of an aluminum tab, overlying the body of the stomach.  The lungs are well-aerated and clear. There is no evidence of focal opacification, pleural effusion or pneumothorax. The cardiomediastinal silhouette is within normal limits.  The visualized bowel gas pattern is unremarkable. Scattered stool and air are seen within the colon; there is no evidence of small bowel dilatation to suggest obstruction. No free intra-abdominal air is identified on the provided upright view.  No acute osseous abnormalities are seen; the sacroiliac joints are unremarkable in appearance.  IMPRESSION: Thin focus of slightly increased density noted, with the appearance of an aluminum tab, overlying the body of the stomach.   Electronically Signed   By: Garald Balding M.D.   On: 04/08/2014 23:06     EKG Interpretation None      MDM   Final diagnoses:  Swallowed foreign  body, initial encounter    X-ray reveals the foreign body to be within the stomach. This should pass without any difficulty, however she is to return as needed if she develops abdominal pain, vomiting, or other concerning symptoms.   Veryl Speak, MD 04/09/14 [phone]

## 2014-10-19 ENCOUNTER — Encounter (HOSPITAL_COMMUNITY): Payer: Self-pay | Admitting: *Deleted

## 2014-10-19 ENCOUNTER — Emergency Department (HOSPITAL_COMMUNITY)
Admission: EM | Admit: 2014-10-19 | Discharge: 2014-10-19 | Disposition: A | Discharge status: Home / Self Care | Payer: No Typology Code available for payment source | Attending: Emergency Medicine | Admitting: Emergency Medicine

## 2014-10-19 DIAGNOSIS — I10 Essential (primary) hypertension: Secondary | ICD-10-CM | POA: Insufficient documentation

## 2014-10-19 DIAGNOSIS — Y92410 Unspecified street and highway as the place of occurrence of the external cause: Secondary | ICD-10-CM | POA: Diagnosis not present

## 2014-10-19 DIAGNOSIS — M545 Low back pain, unspecified: Secondary | ICD-10-CM

## 2014-10-19 DIAGNOSIS — R51 Headache: Secondary | ICD-10-CM

## 2014-10-19 DIAGNOSIS — S161XXA Strain of muscle, fascia and tendon at neck level, initial encounter: Secondary | ICD-10-CM | POA: Insufficient documentation

## 2014-10-19 DIAGNOSIS — Z8709 Personal history of other diseases of the respiratory system: Secondary | ICD-10-CM | POA: Insufficient documentation

## 2014-10-19 DIAGNOSIS — S3992XA Unspecified injury of lower back, initial encounter: Secondary | ICD-10-CM | POA: Diagnosis present

## 2014-10-19 DIAGNOSIS — Z720 Tobacco use: Secondary | ICD-10-CM | POA: Insufficient documentation

## 2014-10-19 DIAGNOSIS — Z7952 Long term (current) use of systemic steroids: Secondary | ICD-10-CM | POA: Diagnosis not present

## 2014-10-19 DIAGNOSIS — Y999 Unspecified external cause status: Secondary | ICD-10-CM | POA: Diagnosis not present

## 2014-10-19 DIAGNOSIS — S0990XA Unspecified injury of head, initial encounter: Secondary | ICD-10-CM | POA: Diagnosis not present

## 2014-10-19 DIAGNOSIS — S39012A Strain of muscle, fascia and tendon of lower back, initial encounter: Secondary | ICD-10-CM | POA: Diagnosis not present

## 2014-10-19 DIAGNOSIS — Z79899 Other long term (current) drug therapy: Secondary | ICD-10-CM | POA: Diagnosis not present

## 2014-10-19 DIAGNOSIS — K219 Gastro-esophageal reflux disease without esophagitis: Secondary | ICD-10-CM | POA: Diagnosis not present

## 2014-10-19 DIAGNOSIS — Y9389 Activity, other specified: Secondary | ICD-10-CM | POA: Diagnosis not present

## 2014-10-19 DIAGNOSIS — R519 Headache, unspecified: Secondary | ICD-10-CM

## 2014-10-19 MED ORDER — IBUPROFEN 400 MG PO TABS
800.0000 mg | ORAL_TABLET | Freq: Once | ORAL | Status: AC
  Administered 2014-10-19: 800 mg via ORAL
  Filled 2014-10-19: qty 2

## 2014-10-19 MED ORDER — METHOCARBAMOL 500 MG PO TABS: 500.0000 mg | ORAL_TABLET | Freq: Two times a day (BID) | ORAL | Status: DC

## 2014-10-19 MED ORDER — NAPROXEN 500 MG PO TABS: 500.0000 mg | ORAL_TABLET | Freq: Two times a day (BID) | ORAL | Status: DC

## 2014-10-19 MED ORDER — METHOCARBAMOL 500 MG PO TABS
500.0000 mg | ORAL_TABLET | Freq: Once | ORAL | Status: AC
  Administered 2014-10-19: 500 mg via ORAL
  Filled 2014-10-19: qty 1

## 2014-10-19 NOTE — Discharge Instructions (Signed)
1. Medications: robaxin, naproxyn, usual home medications °2. Treatment: rest, drink plenty of fluids, gentle stretching as discussed, alternate ice and heat °3. Follow Up: Please followup with your primary doctor in 3 days for discussion of your diagnoses and further evaluation after today's visit; if you do not have a primary care doctor use the resource guide provided to find one;  Return to the ER for worsening back pain, difficulty walking, loss of bowel or bladder control or other concerning symptoms ° ° ° °Back Exercises °Back exercises help treat and prevent back injuries. The goal of back exercises is to increase the strength of your abdominal and back muscles and the flexibility of your back. These exercises should be started when you no longer have back pain. Back exercises include: °· Pelvic Tilt. Lie on your back with your knees bent. Tilt your pelvis until the lower part of your back is against the floor. Hold this position 5 to 10 sec and repeat 5 to 10 times. °· Knee to Chest. Pull first 1 knee up against your chest and hold for 20 to 30 seconds, repeat this with the other knee, and then both knees. This may be done with the other leg straight or bent, whichever feels better. °· Sit-Ups or Curl-Ups. Bend your knees 90 degrees. Start with tilting your pelvis, and do a partial, slow sit-up, lifting your trunk only 30 to 45 degrees off the floor. Take at least 2 to 3 seconds for each sit-up. Do not do sit-ups with your knees out straight. If partial sit-ups are difficult, simply do the above but with only tightening your abdominal muscles and holding it as directed. °· Hip-Lift. Lie on your back with your knees flexed 90 degrees. Push down with your feet and shoulders as you raise your hips a couple inches off the floor; hold for 10 seconds, repeat 5 to 10 times. °· Back arches. Lie on your stomach, propping yourself up on bent elbows. Slowly press on your hands, causing an arch in your low back. Repeat  3 to 5 times. Any initial stiffness and discomfort should lessen with repetition over time. °· Shoulder-Lifts. Lie face down with arms beside your body. Keep hips and torso pressed to floor as you slowly lift your head and shoulders off the floor. °Do not overdo your exercises, especially in the beginning. Exercises may cause you some mild back discomfort which lasts for a few minutes; however, if the pain is more severe, or lasts for more than 15 minutes, do not continue exercises until you see your caregiver. Improvement with exercise therapy for back problems is slow.  °See your caregivers for assistance with developing a proper back exercise program. °Document Released: 03/02/2004 Document Revised: 04/17/2011 Document Reviewed: 11/24/2010 °[employer]® Patient Information ©2015 [employer]. This information is not intended to replace advice given to you by your health care provider. Make sure you discuss any questions you have with your health care provider. ° °
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
## 2014-10-20 ENCOUNTER — Encounter: Payer: Self-pay | Admitting: Internal Medicine

## 2014-10-20 ENCOUNTER — Ambulatory Visit: Payer: Medicaid Other | Attending: Internal Medicine | Admitting: Internal Medicine

## 2014-10-20 VITALS — BP 129/85 | HR 86 | Temp 98.8°F | Resp 16 | Ht 63.0 in | Wt 162.6 lb

## 2014-10-20 DIAGNOSIS — R296 Repeated falls: Secondary | ICD-10-CM

## 2014-10-20 DIAGNOSIS — I10 Essential (primary) hypertension: Secondary | ICD-10-CM | POA: Diagnosis present

## 2014-10-20 DIAGNOSIS — K219 Gastro-esophageal reflux disease without esophagitis: Secondary | ICD-10-CM | POA: Insufficient documentation

## 2014-10-20 DIAGNOSIS — Z720 Tobacco use: Secondary | ICD-10-CM | POA: Insufficient documentation

## 2014-10-20 MED ORDER — AMLODIPINE BESY-BENAZEPRIL HCL 10-20 MG PO CAPS: 1.0000 | ORAL_CAPSULE | Freq: Every day | ORAL | Status: DC

## 2014-10-20 MED ORDER — OMEPRAZOLE 20 MG PO CPDR: 20.0000 mg | DELAYED_RELEASE_CAPSULE | Freq: Every day | ORAL | Status: DC

## 2014-10-20 NOTE — Patient Instructions (Addendum)
Please return on Friday 10/23/14 for fasting blood work      Smoking Cessation Quitting smoking is important to your health and has many advantages. However, it is not always easy to quit since nicotine is a very addictive drug. Oftentimes, people try 3 times or more before being able to quit. This document explains the best ways for you to prepare to quit smoking. Quitting takes hard work and a lot of effort, but you can do it. ADVANTAGES OF QUITTING SMOKING  You will live longer, feel better, and live better.  Your body will feel the impact of quitting smoking almost immediately.  Within 20 minutes, blood pressure decreases. Your pulse returns to its normal level.  After 8 hours, carbon monoxide levels in the blood return to normal. Your oxygen level increases.  After 24 hours, the chance of having a heart attack starts to decrease. Your breath, hair, and body stop smelling like smoke.  After 48 hours, damaged nerve endings begin to recover. Your sense of taste and smell improve.  After 72 hours, the body is virtually free of nicotine. Your bronchial tubes relax and breathing becomes easier.  After 2 to 12 weeks, lungs can hold more air. Exercise becomes easier and circulation improves.  The risk of having a heart attack, stroke, cancer, or lung disease is greatly reduced.  After 1 year, the risk of coronary heart disease is cut in half.  After 5 years, the risk of stroke falls to the same as a nonsmoker.  After 10 years, the risk of lung cancer is cut in half and the risk of other cancers decreases significantly.  After 15 years, the risk of coronary heart disease drops, usually to the level of a nonsmoker.  If you are pregnant, quitting smoking will improve your chances of having a healthy baby.  The people you live with, especially any children, will be healthier.  You will have extra money to spend on things other than cigarettes. QUESTIONS TO THINK ABOUT BEFORE  ATTEMPTING TO QUIT You may want to talk about your answers with your health care provider.  Why do you want to quit?  If you tried to quit in the past, what helped and what did not?  What will be the most difficult situations for you after you quit? How will you plan to handle them?  Who can help you through the tough times? Your family? Friends? A health care provider?  What pleasures do you get from smoking? What ways can you still get pleasure if you quit? Here are some questions to ask your health care provider:  How can you help me to be successful at quitting?  What medicine do you think would be best for me and how should I take it?  What should I do if I need more help?  What is smoking withdrawal like? How can I get information on withdrawal? GET READY  Set a quit date.  Change your environment by getting rid of all cigarettes, ashtrays, matches, and lighters in your home, car, or work. Do not let people smoke in your home.  Review your past attempts to quit. Think about what worked and what did not. GET SUPPORT AND ENCOURAGEMENT You have a better chance of being successful if you have help. You can get support in many ways.  Tell your family, friends, and coworkers that you are going to quit and need their support. Ask them not to smoke around you.  Get individual, group, or  telephone counseling and support. Programs are available at [employer] and health centers. Call your local health department for information about programs in your area.  Spiritual beliefs and practices may help some smokers quit.  Download a "quit meter" on your computer to keep track of quit statistics, such as how long you have gone without smoking, cigarettes not smoked, and money saved.  Get a self-help book about quitting smoking and staying off tobacco. [place] yourself from urges to smoke. Talk to someone, go for a walk, or occupy your time with a  task.  Change your normal routine. Take a different route to work. Drink tea instead of coffee. Eat breakfast in a different place.  Reduce your stress. Take a hot bath, exercise, or read a book.  Plan something enjoyable to do every day. Reward yourself for not smoking.  Explore interactive web-based programs that specialize in helping you quit. GET MEDICINE AND USE IT CORRECTLY Medicines can help you stop smoking and decrease the urge to smoke. Combining medicine with the above behavioral methods and support can greatly increase your chances of successfully quitting smoking.  Nicotine replacement therapy helps deliver nicotine to your body without the negative effects and risks of smoking. Nicotine replacement therapy includes nicotine gum, lozenges, inhalers, nasal sprays, and skin patches. Some may be available over-the-counter and others require a prescription.  Antidepressant medicine helps people abstain from smoking, but how this works is unknown. This medicine is available by prescription.  Nicotinic receptor partial agonist medicine simulates the effect of nicotine in your brain. This medicine is available by prescription. Ask your health care provider for advice about which medicines to use and how to use them based on your health history. Your health care provider will tell you what side effects to look out for if you choose to be on a medicine or therapy. Carefully read the information on the package. Do not use any other product containing nicotine while using a nicotine replacement product.  RELAPSE OR DIFFICULT SITUATIONS Most relapses occur within the first 3 months after quitting. Do not be discouraged if you start smoking again. Remember, most people try several times before finally quitting. You may have symptoms of withdrawal because your body is used to nicotine. You may crave cigarettes, be irritable, feel very hungry, cough often, get headaches, or have difficulty  concentrating. The withdrawal symptoms are only temporary. They are strongest when you first quit, but they will go away within 10-14 days. To reduce the chances of relapse, try to:  Avoid drinking alcohol. Drinking lowers your chances of successfully quitting.  Reduce the amount of caffeine you consume. Once you quit smoking, the amount of caffeine in your body increases and can give you symptoms, such as a rapid heartbeat, sweating, and anxiety.  Avoid smokers because they can make you want to smoke.  Do not let weight gain distract you. Many smokers will gain weight when they quit, usually less than 10 pounds. Eat a healthy diet and stay active. You can always lose the weight gained after you quit.  Find ways to improve your mood other than smoking. FOR MORE INFORMATION  www.smokefree.gov  Document Released: 01/17/2001 Document Revised: 06/09/2013 Document Reviewed: 05/04/2011 [facility] Patient Information 2015 [place], [place]. This information is not intended to replace advice given to you by your health care provider. Make sure you discuss any questions you have with your health care provider.

## 2014-10-20 NOTE — Progress Notes (Signed)
Patient here to establish care Patient states she recently had her medications refilled So she should be good Patient did state she suffers from depression-had patient fill out depression screening

## 2014-10-20 NOTE — Progress Notes (Signed)
Patient ID: Laura Huerta, female   DOB: [birth year], 49 y.o.   MRN: [id number]  GGY:[id number]  [id number]  DOB - 1965/02/26  CC:  Chief Complaint  Patient presents with  . New Patient (Initial Visit)       HPI: Laura Huerta is a 49 y.o. female here today to establish medical care. Patient has a past medical history of HTN, depression, and acid reflux.  Patient reports that she has been on Lotrel, Seroquel, and Wellbutrin.  Patient reports that she takes her blood pressure medication daily as prescribed without complications. She denies symptoms of chest pain, palpitations, SOB, headaches. Her current psychiatrist is with [facility] but she does not know her diagnoses. She believes she was placed on Seroquel to help her sleep. She states that she was on Ambien in the past but they did not help her stay asleep. She is a current 1 ppd smoker while on Chantix. She states that she has decreased from 2 ppd. She was previously seen at [facility].  She reports that she has been having falls for 5 years. She states that before the falls she feels weak and slightly dizzy. She states that sometimes her left leg feels weak and then she falls forward. For the past 3 months she notes that she has begun to fall backwards and cannot control it. The falls happen randomly and have occurred while cooking. Patient is requesting evaluation. Falls were occuring before patient started BP medication.   No Known Allergies Past Medical History  Diagnosis Date  . Acid reflux   . Bronchitis   . Hypertension    Current Outpatient Prescriptions on File Prior to Visit  Medication Sig Dispense Refill  . amLODipine-benazepril (LOTREL) 10-20 MG per capsule Take 1 capsule by mouth daily.    Marland Kitchen buPROPion (WELLBUTRIN) 75 MG tablet Take 75 mg by mouth 2 (two) times daily.     . famotidine-calcium carbonate-magnesium hydroxide (PEPCID COMPLETE) 10-800-165 MG CHEW chewable tablet Chew 1  tablet by mouth daily as needed.    Marland Kitchen omeprazole (PRILOSEC) 40 MG capsule Take 1 capsule (40 mg total) by mouth daily. 30 capsule 2  . albuterol (PROVENTIL HFA;VENTOLIN HFA) 108 (90 BASE) MCG/ACT inhaler Inhale 2 puffs into the lungs every 4 (four) hours as needed for wheezing or shortness of breath. (Patient not taking: Reported on 10/20/2014) 1 Inhaler 0  . levofloxacin (LEVAQUIN) 500 MG tablet Take 1 tablet (500 mg total) by mouth daily. (Patient not taking: Reported on 10/20/2014) 7 tablet 0  . methocarbamol (ROBAXIN) 500 MG tablet Take 1 tablet (500 mg total) by mouth 2 (two) times daily. (Patient not taking: Reported on 10/20/2014) 20 tablet 0  . naproxen (NAPROSYN) 500 MG tablet Take 1 tablet (500 mg total) by mouth 2 (two) times daily with a meal. (Patient not taking: Reported on 10/20/2014) 30 tablet 0  . predniSONE (DELTASONE) 20 MG tablet Take 3 tabs po on first day, 2 tabs second day, 2 tabs third day, 1 tab fourth day, 1 tab 5th day. Take with food. (Patient not taking: Reported on 10/20/2014) 9 tablet 0   No current facility-administered medications on file prior to visit.   Family History  Problem Relation Age of Onset  . Hypertension Mother   . Heart failure Mother     Died age 14  . Hypertension Father   . Diabetes Father   . Breast cancer Maternal Aunt    Social History   Social History  .  Marital Status: Single    Spouse Name: N/A  . Number of Children: 1  . Years of Education: N/A   Occupational History  . CNA    Social History Main Topics  . Smoking status: Current Every Day Smoker -- 2.00 packs/day for 10 years    Types: Cigarettes  . Smokeless tobacco: Never Used  . Alcohol Use: 1.2 oz/week    2 Cans of beer per week  . Drug Use: No  . Sexual Activity: Yes    Birth Control/ Protection: Surgical   Other Topics Concern  . Not on file   Social History Narrative   Lives with son.      Review of Systems  Respiratory: Negative for cough and shortness of  breath.   Cardiovascular: Negative for chest pain, palpitations and leg swelling.  Gastrointestinal: Positive for heartburn.  Musculoskeletal: Positive for falls.  Neurological: Positive for dizziness. Negative for tingling, sensory change, speech change, focal weakness, seizures, loss of consciousness and headaches.  All other systems reviewed and are negative.  Objective:   Filed Vitals:   10/20/14 1440  BP: 129/85  Pulse: 86  Temp: 98.8 F (37.1 C)  Resp: 16    Physical Exam: Constitutional: Patient appears well-developed and well-nourished. No distress. Neck: Normal ROM. Neck supple. No JVD. No tracheal deviation. No thyromegaly. CVS: RRR, S1/S2 +, no murmurs, no gallops, no carotid bruit.  Pulmonary: Effort and breath sounds normal, no stridor, rhonchi, wheezes, rales.  Abdominal: Soft. BS +, no distension, tenderness, rebound or guarding.  Musculoskeletal: Normal range of motion. No edema and no tenderness.  Neuro: Alert. Normal reflexes, muscle tone coordination. No cranial nerve deficit. Skin: Skin is warm and dry. No rash noted. Not diaphoretic. No erythema. No pallor. Psychiatric: Normal mood and affect. Behavior, judgment, thought content normal.  Lab Results  Component Value Date   WBC 7.6 09/05/2012   HGB 15.1* 09/05/2012   HCT 44.0 09/05/2012   MCV 73.0* 09/05/2012   PLT 228 09/05/2012   Lab Results  Component Value Date   CREATININE 0.82 09/05/2012   BUN 5* 09/05/2012   NA 137 09/05/2012   K 3.8 09/05/2012   CL 101 09/05/2012   CO2 26 09/05/2012    No results found for: HGBA1C Lipid Panel  No results found for: CHOL, TRIG, HDL, CHOLHDL, VLDL, LDLCALC     Assessment and plan:   [name] was seen today for new patient (initial visit).  Diagnoses and all orders for this visit:  Essential hypertension -     Lipid panel; Future -     COMPLETE METABOLIC PANEL WITH GFR; Future -     CBC; Future -    Refill amLODipine-benazepril (LOTREL) 10-20 MG  per capsule; Take 1 capsule by mouth daily. Patient blood pressure is stable and may continue on current medication.  Education on diet, exercise, and modifiable risk factors discussed. Will obtain appropriate labs as needed. Will follow up in 3-6 months.  Multiple falls -     Ambulatory referral to Neurology I am unsure of etiology but I will refer patient to neurology No orthostatic hypotension  Gastroesophageal reflux disease, esophagitis presence not specified -    Refill omeprazole (PRILOSEC) 20 MG capsule; Take 1 capsule (20 mg total) by mouth daily.  Discussed diet and weight with patient relating to acid reflux.  Went over things that may exacerbate acid reflux such as tomatoes, spicy foods, coffee, carbonated beverages, chocolates, etc.  Advised patient to avoid laying down at  least two hours after meals and sleep with HOB elevated.    Return in about 3 months (around 01/19/2015) for Hypertension.    Lance Bosch, [facility] [phone] 10/20/2014, 2:58 PM

## 2014-10-23 ENCOUNTER — Ambulatory Visit: Payer: Medicaid Other | Attending: Internal Medicine

## 2014-10-23 DIAGNOSIS — I10 Essential (primary) hypertension: Secondary | ICD-10-CM

## 2014-10-23 LAB — COMPLETE METABOLIC PANEL WITH GFR
ALT: 10 U/L (ref 6–29)
AST: 15 U/L (ref 10–35)
Albumin: 4.2 g/dL (ref 3.6–5.1)
Alkaline Phosphatase: 82 U/L (ref 33–130)
BUN: 8 mg/dL (ref 7–25)
CALCIUM: 9.6 mg/dL (ref 8.6–10.4)
CHLORIDE: 104 mmol/L (ref 98–110)
CO2: 25 mmol/L (ref 20–31)
Creat: 0.76 mg/dL (ref 0.50–1.05)
Glucose, Bld: 87 mg/dL (ref 65–99)
Potassium: 4.4 mmol/L (ref 3.5–5.3)
Sodium: 141 mmol/L (ref 135–146)
Total Bilirubin: 0.4 mg/dL (ref 0.2–1.2)
Total Protein: 6.9 g/dL (ref 6.1–8.1)

## 2014-10-23 LAB — CBC
HCT: 41.1 % (ref 36.0–46.0)
Hemoglobin: 12.6 g/dL (ref 12.0–15.0)
MCH: 23.1 pg — ABNORMAL LOW (ref 26.0–34.0)
MCHC: 30.7 g/dL (ref 30.0–36.0)
MCV: 75.3 fL — AB (ref 78.0–100.0)
MPV: 10.4 fL (ref 8.6–12.4)
Platelets: 317 10*3/uL (ref 150–400)
RBC: 5.46 MIL/uL — AB (ref 3.87–5.11)
RDW: 15.5 % (ref 11.5–15.5)
WBC: 8.5 10*3/uL (ref 4.0–10.5)

## 2014-10-23 LAB — LIPID PANEL
CHOLESTEROL: 181 mg/dL (ref 125–200)
HDL: 54 mg/dL (ref >=46)
LDL CALC: 113 mg/dL (ref <130)
TRIGLYCERIDES: 69 mg/dL (ref <150)
Total CHOL/HDL Ratio: 3.4 Ratio (ref <=5.0)
VLDL: 14 mg/dL (ref <30)

## 2014-10-27 ENCOUNTER — Telehealth: Payer: Self-pay

## 2014-10-27 MED ORDER — ATORVASTATIN CALCIUM 10 MG PO TABS: 10.0000 mg | ORAL_TABLET | Freq: Every day | ORAL | Status: DC

## 2014-10-27 NOTE — Telephone Encounter (Signed)
Patient called back wanting to know if she was tested to check her kidney. Please f/u

## 2014-10-27 NOTE — Telephone Encounter (Signed)
-----   Message from Lance Bosch, NP sent at 10/27/2014 11:09 AM EDT ----- Patients cholesterol is elevated. Please have patient to begin taking atorvastatin 10 mg once daily. Exercise and explain specific diet changes. Thanks. She needs to quit smoking as well

## 2014-10-27 NOTE — Telephone Encounter (Signed)
Patient is aware of her lab results And to pick up her atorvastatin at the [facility] on file

## 2014-10-29 ENCOUNTER — Telehealth: Payer: Self-pay | Admitting: Internal Medicine

## 2014-10-29 NOTE — Telephone Encounter (Signed)
Patient stated that she was told by nurse that she had High Cholesterol.  Patient would like further explanation on what High Cholesterol is and requested papers explaining it. Please follow up.

## 2014-11-03 ENCOUNTER — Telehealth: Payer: Self-pay

## 2014-11-03 NOTE — Telephone Encounter (Signed)
Returned patient phone call Patient not available Left message on voice mail to return our call 

## 2014-11-05 NOTE — Telephone Encounter (Signed)
Patient verified DOB. Patient was made aware of kidney function being within normal range. Patient requested information regarding High Cholesterol. Medical assistant obliged patient and informed her of patient education papers being placed at front desk for her earliest pickup convenience. Patient stated she would pick up papers tomorrow afternoon. Patient had no further questions.

## 2014-11-18 ENCOUNTER — Encounter: Payer: Self-pay | Admitting: Neurology

## 2014-11-18 ENCOUNTER — Ambulatory Visit (INDEPENDENT_AMBULATORY_CARE_PROVIDER_SITE_OTHER): Payer: Medicaid Other | Admitting: Neurology

## 2014-11-18 VITALS — BP 112/73 | HR 74 | Ht 64.0 in | Wt 163.0 lb

## 2014-11-18 DIAGNOSIS — R269 Unspecified abnormalities of gait and mobility: Secondary | ICD-10-CM | POA: Diagnosis not present

## 2014-11-18 HISTORY — DX: Unspecified abnormalities of gait and mobility: R26.9

## 2014-11-18 NOTE — Patient Instructions (Addendum)
We will check MRI of the brain.  Fall Prevention in the Home  Falls can cause injuries and can affect people from all age groups. There are many simple things that you can do to make your home safe and to help prevent falls. WHAT CAN I DO ON THE OUTSIDE OF MY HOME?  Regularly repair the edges of walkways and driveways and fix any cracks.  Remove high doorway thresholds.  Trim any shrubbery on the main path into your home.  Use bright outdoor lighting.  Clear walkways of debris and clutter, including tools and rocks.  Regularly check that handrails are securely fastened and in good repair. Both sides of any steps should have handrails.  Install guardrails along the edges of any raised decks or porches.  Have leaves, snow, and ice cleared regularly.  Use sand or salt on walkways during winter months.  In the garage, clean up any spills right away, including grease or oil spills. WHAT CAN I DO IN THE BATHROOM?  Use night lights.  Install grab bars by the toilet and in the tub and shower. Do not use towel bars as grab bars.  Use non-skid mats or decals on the floor of the tub or shower.  If you need to sit down while you are in the shower, use a plastic, non-slip stool.Marland Kitchen  Keep the floor dry. Immediately clean up any water that spills on the floor.  Remove soap buildup in the tub or shower on a regular basis.  Attach bath mats securely with double-sided non-slip rug tape.  Remove throw rugs and other tripping hazards from the floor. WHAT CAN I DO IN THE BEDROOM?  Use night lights.  Make sure that a bedside light is easy to reach.  Do not use oversized bedding that drapes onto the floor.  Have a firm chair that has side arms to use for getting dressed.  Remove throw rugs and other tripping hazards from the floor. WHAT CAN I DO IN THE KITCHEN?   Clean up any spills right away.  Avoid walking on wet floors.  Place frequently used items in easy-to-reach  places.  If you need to reach for something above you, use a sturdy step stool that has a grab bar.  Keep electrical cables out of the way.  Do not use floor polish or wax that makes floors slippery. If you have to use wax, make sure that it is non-skid floor wax.  Remove throw rugs and other tripping hazards from the floor. WHAT CAN I DO IN THE STAIRWAYS?  Do not leave any items on the stairs.  Make sure that there are handrails on both sides of the stairs. Fix handrails that are broken or loose. Make sure that handrails are as long as the stairways.  Check any carpeting to make sure that it is firmly attached to the stairs. Fix any carpet that is loose or worn.  Avoid having throw rugs at the top or bottom of stairways, or secure the rugs with carpet tape to prevent them from moving.  Make sure that you have a light switch at the top of the stairs and the bottom of the stairs. If you do not have them, have them installed. WHAT ARE SOME OTHER FALL PREVENTION TIPS?  Wear closed-toe shoes that fit well and support your feet. Wear shoes that have rubber soles or low heels.  When you use a stepladder, make sure that it is completely opened and that the sides are firmly  locked. Have someone hold the ladder while you are using it. Do not climb a closed stepladder.  Add color or contrast paint or tape to grab bars and handrails in your home. Place contrasting color strips on the first and last steps.  Use mobility aids as needed, such as canes, walkers, scooters, and crutches.  Turn on lights if it is dark. Replace any light bulbs that burn out.  Set up furniture so that there are clear paths. Keep the furniture in the same spot.  Fix any uneven floor surfaces.  Choose a carpet design that does not hide the edge of steps of a stairway.  Be aware of any and all pets.  Review your medicines with your healthcare provider. Some medicines can cause dizziness or changes in blood pressure,  which increase your risk of falling. Talk with your health care provider about other ways that you can decrease your risk of falls. This may include working with a physical therapist or trainer to improve your strength, balance, and endurance.   This information is not intended to replace advice given to you by your health care provider. Make sure you discuss any questions you have with your health care provider.   Document Released: 01/13/2002 Document Revised: 06/09/2014 Document Reviewed: 02/27/2014 Elsevier Interactive Patient Education [employer].

## 2014-11-18 NOTE — Progress Notes (Signed)
Reason for visit: Gait disorder  Referring physician: Dr. Glena Norfolk is a 49 y.o. female  History of present illness:  Laura Huerta is a 49 year old right-handed black female with a history of problems with walking that have been present for about 5 years. The patient indicates that she will have occasional falls that seem to be relatively sudden in nature, occurring without warning. The patient in general will walk completely normally until she falls. She believes that oftentimes there may be a collapse of the left leg before the fall. There is no associated pain, no numbness, she denies any problems controlling the bowels or the bladder. She denies any difficulty with use of the arms. She denies any back pain or neck pain. She does not black out with the falls, she denies palpitations of the heart. She denies any sensation of dizziness, or dimming of vision with the falls. The patient has had increased frequency of falls over the last 3 months, and she is now having difficulty with falling backwards at times. Her last fall was the day before this evaluation. She indicates that she may fall 3-5 times daily. She is sent to this office for an evaluation.  Past Medical History  Diagnosis Date  . Acid reflux   . Bronchitis   . Hypertension   . Depression   . Anxiety   . Hyperlipidemia   . Abnormality of gait 11/18/2014    Past Surgical History  Procedure Laterality Date  . Cesarean section    . Tubal ligation    . Hemorrhoid banding  2015    Family History  Problem Relation Age of Onset  . Hypertension Mother   . Heart failure Mother     Died age 55  . COPD Mother   . Asthma Mother   . Diabetes Mother   . Depression Mother   . Hypertension Father   . Diabetes Father   . Breast cancer Maternal Aunt     Social history:  reports that she has been smoking Cigarettes.  She has a 10 pack-year smoking history. She has never used smokeless tobacco. She reports that  she drinks alcohol. She reports that she does not use illicit drugs.  Medications:  Prior to Admission medications   Medication Sig Start Date End Date Taking? Authorizing Provider  amLODipine-benazepril (LOTREL) 10-20 MG per capsule Take 1 capsule by mouth daily. 10/20/14  Yes Lance Bosch, NP  atorvastatin (LIPITOR) 10 MG tablet Take 1 tablet (10 mg total) by mouth daily. 10/27/14  Yes Lance Bosch, NP  buPROPion (WELLBUTRIN) 75 MG tablet Take 75 mg by mouth 2 (two) times daily.    Yes Historical Provider, MD  famotidine-calcium carbonate-magnesium hydroxide (PEPCID COMPLETE) 10-800-165 MG CHEW chewable tablet Chew 1 tablet by mouth daily as needed.   Yes Historical Provider, MD  omeprazole (PRILOSEC) 40 MG capsule Take 1 capsule (40 mg total) by mouth daily. 05/09/12  Yes Rosana Hoes, MD  QUEtiapine (SEROQUEL) 25 MG tablet Take 25 mg by mouth at bedtime. Taking 1-2 tablets QHS   Yes Historical Provider, MD  zolpidem (AMBIEN) 10 MG tablet Take 10 mg by mouth at bedtime as needed for sleep.   Yes Historical Provider, MD  methocarbamol (ROBAXIN) 500 MG tablet Take 1 tablet (500 mg total) by mouth 2 (two) times daily. Patient not taking: Reported on 10/20/2014 10/19/14   Jarrett Soho Muthersbaugh, PA-C     No Known Allergies  ROS:  Out of a complete  14 system review of symptoms, the patient complains only of the following symptoms, and all other reviewed systems are negative.  Chest pain Ringing in the ears Shortness of breath Feeling hot, flushing Depression, anxiety, nontender sleep, decreased energy, racing thoughts Restless legs  Blood pressure 112/73, pulse 74, height 5\' 4"  (1.626 m), weight 163 lb (73.936 kg), last menstrual period 04/09/2011.  Physical Exam  General: The patient is alert and cooperative at the time of the examination.  Eyes: Pupils are equal, round, and reactive to light. Discs are flat bilaterally.  Neck: The neck is supple, no carotid bruits are  noted.  Respiratory: The respiratory examination is notable for posterior wheezes.  Cardiovascular: The cardiovascular examination reveals a regular rate and rhythm, no obvious murmurs or rubs are noted.  Skin: Extremities are without significant edema.  Neurologic Exam  Mental status: The patient is alert and oriented x 3 at the time of the examination. The patient has apparent normal recent and remote memory, with an apparently normal attention span and concentration ability.  Cranial nerves: Facial symmetry is present. There is good sensation of the face to pinprick and soft touch bilaterally. The strength of the facial muscles and the muscles to head turning and shoulder shrug are normal bilaterally. Speech is well enunciated, no aphasia or dysarthria is noted. Extraocular movements are full. Visual fields are full. The tongue is midline, and the patient has symmetric elevation of the soft palate. No obvious hearing deficits are noted.  Motor: The motor testing reveals 5 over 5 strength of all 4 extremities. Good symmetric motor tone is noted throughout.  Sensory: Sensory testing is intact to pinprick, soft touch, vibration sensation, and position sense on all 4 extremities. No evidence of extinction is noted.  Coordination: Cerebellar testing reveals good finger-nose-finger and heel-to-shin bilaterally.  Gait and station: Gait is normal. Tandem gait is normal. Romberg is negative. No drift is seen.  Reflexes: Deep tendon reflexes are symmetric and normal bilaterally. Toes are downgoing bilaterally.   Assessment/Plan:  1. Gait disorder  Clinical neurologic examination today is completely normal. The etiology of the falls is not clear. The patient will be sent for blood work today, and MRI evaluation of the brain. If this is unremarkable, we will set her up for EEG evaluation. I am not clear that physical therapy is likely to help her.  Jill Alexanders MD 11/18/2014 6:16  PM  [facility] [address] [place], [place] [address]  Phone [phone] Fax [phone]

## 2014-11-20 ENCOUNTER — Telehealth: Payer: Self-pay

## 2014-11-20 LAB — COPPER, SERUM: Copper: 150 ug/dL (ref 72–166)

## 2014-11-20 LAB — RPR: RPR Ser Ql: NONREACTIVE

## 2014-11-20 LAB — SEDIMENTATION RATE: Sed Rate: 16 mm/hr (ref 0–40)

## 2014-11-20 LAB — HIV ANTIBODY (ROUTINE TESTING W REFLEX): HIV Screen 4th Generation wRfx: NONREACTIVE

## 2014-11-20 LAB — VITAMIN B12: VITAMIN B 12: 426 pg/mL (ref 211–946)

## 2014-11-20 NOTE — Telephone Encounter (Signed)
-----   Message from Kathrynn Ducking, MD sent at 11/20/2014  7:37 AM EDT -----  The blood work results are unremarkable. Please call the patient.  ----- Message -----    From: [facility] Lab Results In Interface    Sent: 11/19/2014   7:45 AM      To: Kathrynn Ducking, MD

## 2014-11-20 NOTE — Telephone Encounter (Signed)
I called the patient and left a voicemail relaying results.

## 2014-11-23 ENCOUNTER — Other Ambulatory Visit: Payer: Self-pay | Admitting: Neurology

## 2014-11-24 ENCOUNTER — Emergency Department (HOSPITAL_COMMUNITY)
Admission: EM | Admit: 2014-11-24 | Discharge: 2014-11-24 | Disposition: A | Discharge status: Home / Self Care | Payer: Medicaid Other | Source: Home / Self Care

## 2014-11-24 ENCOUNTER — Ambulatory Visit: Payer: Medicaid Other | Admitting: Internal Medicine

## 2014-11-24 ENCOUNTER — Encounter (HOSPITAL_COMMUNITY): Payer: Self-pay | Admitting: Emergency Medicine

## 2014-11-24 DIAGNOSIS — J399 Disease of upper respiratory tract, unspecified: Secondary | ICD-10-CM

## 2014-11-24 MED ORDER — HYDROCOD POLST-CPM POLST ER 10-8 MG/5ML PO SUER: 5.0000 mL | Freq: Every evening | ORAL | Status: DC | PRN

## 2014-11-24 MED ORDER — GUAIFENESIN-CODEINE 100-10 MG/5ML PO SOLN: 5.0000 mL | Freq: Three times a day (TID) | ORAL | Status: DC | PRN

## 2014-11-24 MED ORDER — BENZONATATE 100 MG PO CAPS: 100.0000 mg | ORAL_CAPSULE | Freq: Three times a day (TID) | ORAL | Status: DC | PRN

## 2014-11-24 MED ORDER — IPRATROPIUM BROMIDE 0.03 % NA SOLN: 2.0000 | Freq: Two times a day (BID) | NASAL | Status: DC

## 2014-11-24 NOTE — ED Notes (Signed)
Pt here with c/o URI's sx's cough, nasal congestion with yellow thick phlegm now with worsening body aches, chills and chest pain with breathing Taking otc day time, not effective Denies n,v,d Pt is 1 pack per day smoker with Hx Bronchitis

## 2014-11-24 NOTE — Discharge Instructions (Signed)
Upper Respiratory Infection, Adult Most upper respiratory infections (URIs) are a viral infection of the air passages leading to the lungs. A URI affects the nose, throat, and upper air passages. The most common type of URI is nasopharyngitis and is typically referred to as "the common cold." URIs run their course and usually go away on their own. Most of the time, a URI does not require medical attention, but sometimes a bacterial infection in the upper airways can follow a viral infection. This is called a secondary infection. Sinus and middle ear infections are common types of secondary upper respiratory infections. Bacterial pneumonia can also complicate a URI. A URI can worsen asthma and chronic obstructive pulmonary disease (COPD). Sometimes, these complications can require emergency medical care and may be life threatening.  CAUSES Almost all URIs are caused by viruses. A virus is a type of germ and can spread from one person to another.  RISKS FACTORS You may be at risk for a URI if:   You smoke.   You have chronic heart or lung disease.  You have a weakened defense (immune) system.   You are very young or very old.   You have nasal allergies or asthma.  You work in crowded or poorly ventilated areas.  You work in health care facilities or schools. SIGNS AND SYMPTOMS  Symptoms typically develop 2-3 days after you come in contact with a cold virus. Most viral URIs last 7-10 days. However, viral URIs from the influenza virus (flu virus) can last 14-18 days and are typically more severe. Symptoms may include:   Runny or stuffy (congested) nose.   Sneezing.   Cough.   Sore throat.   Headache.   Fatigue.   Fever.   Loss of appetite.   Pain in your forehead, behind your eyes, and over your cheekbones (sinus pain).  Muscle aches.  DIAGNOSIS  Your health care provider may diagnose a URI by:  Physical exam.  Tests to check that your symptoms are not due to  another condition such as:  Strep throat.  Sinusitis.  Pneumonia.  Asthma. TREATMENT  A URI goes away on its own with time. It cannot be cured with medicines, but medicines may be prescribed or recommended to relieve symptoms. Medicines may help:  Reduce your fever.  Reduce your cough.  Relieve nasal congestion. HOME CARE INSTRUCTIONS   Take medicines only as directed by your health care provider.   Gargle warm saltwater or take cough drops to comfort your throat as directed by your health care provider.  Use a warm mist humidifier or inhale steam from a shower to increase air moisture. This may make it easier to breathe.  Drink enough fluid to keep your urine clear or pale yellow.   Eat soups and other clear broths and maintain good nutrition.   Rest as needed.   Return to work when your temperature has returned to normal or as your health care provider advises. You may need to stay home longer to avoid infecting others. You can also use a face mask and careful hand washing to prevent spread of the virus.  Increase the usage of your inhaler if you have asthma.   Do not use any tobacco products, including cigarettes, chewing tobacco, or electronic cigarettes. If you need help quitting, ask your health care provider. PREVENTION  The best way to protect yourself from getting a cold is to practice good hygiene.   Avoid oral or hand contact with people with cold   symptoms.   Wash your hands often if contact occurs.  There is no clear evidence that vitamin C, vitamin E, echinacea, or exercise reduces the chance of developing a cold. However, it is always recommended to get plenty of rest, exercise, and practice good nutrition.  SEEK MEDICAL CARE IF:   You are getting worse rather than better.   Your symptoms are not controlled by medicine.   You have chills.  You have worsening shortness of breath.  You have brown or red mucus.  You have yellow or brown nasal  discharge.  You have pain in your face, especially when you bend forward.  You have a fever.  You have swollen neck glands.  You have pain while swallowing.  You have white areas in the back of your throat. SEEK IMMEDIATE MEDICAL CARE IF:   You have severe or persistent:  Headache.  Ear pain.  Sinus pain.  Chest pain.  You have chronic lung disease and any of the following:  Wheezing.  Prolonged cough.  Coughing up blood.  A change in your usual mucus.  You have a stiff neck.  You have changes in your:  Vision.  Hearing.  Thinking.  Mood. MAKE SURE YOU:   Understand these instructions.  Will watch your condition.  Will get help right away if you are not doing well or get worse.   This information is not intended to replace advice given to you by your health care provider. Make sure you discuss any questions you have with your health care provider.   Document Released: 07/19/2000 Document Revised: 06/09/2014 Document Reviewed: 04/30/2013 Elsevier Interactive Patient Education 2016 [employer]  

## 2014-11-24 NOTE — ED Provider Notes (Signed)
CSN: [id number]     Arrival date & time 11/24/14  1419 History   None    Chief Complaint  Patient presents with  . Cough  . URI    HPI Patient presents for productive cough that has been present for past 5 days that has gotten progressively worse and is keeping her up at night. Coughing and sneezing are also causing pain in the middle of her back. Additionally adds that she is having right sided ear pain, rhinorrhea, congestion, and sore throat. Denies fever, SOB/CP, N/V, or headache. No known sick contacts. No h/o asthma or allergies. NKDA.  Past Medical History  Diagnosis Date  . Acid reflux   . Bronchitis   . Hypertension   . Depression   . Anxiety   . Hyperlipidemia   . Abnormality of gait 11/18/2014   Past Surgical History  Procedure Laterality Date  . Cesarean section    . Tubal ligation    . Hemorrhoid banding  2015   Family History  Problem Relation Age of Onset  . Hypertension Mother   . Heart failure Mother     Died age 57  . COPD Mother   . Asthma Mother   . Diabetes Mother   . Depression Mother   . Hypertension Father   . Diabetes Father   . Breast cancer Maternal Aunt    Social History  Substance Use Topics  . Smoking status: Current Every Day Smoker -- 1.00 packs/day for 10 years    Types: Cigarettes  . Smokeless tobacco: Never Used  . Alcohol Use: 0.0 oz/week    0 Standard drinks or equivalent per week     Comment: 40-60 oz. per week   OB History    Gravida Para Term Preterm AB TAB SAB Ectopic Multiple Living   7 2 2  5 5    2      Review of Systems As noted above. Allergies  Review of patient's allergies indicates no known allergies.  Home Medications   Prior to Admission medications   Medication Sig Start Date End Date Taking? Authorizing Provider  amLODipine-benazepril (LOTREL) 10-20 MG per capsule Take 1 capsule by mouth daily. 10/20/14   Lance Bosch, NP  atorvastatin (LIPITOR) 10 MG tablet Take 1 tablet (10 mg total) by mouth  daily. 10/27/14   Lance Bosch, NP  benzonatate (TESSALON) 100 MG capsule Take 1-2 capsules (100-200 mg total) by mouth 3 (three) times daily as needed for cough. 11/24/14   Jeanine Caven R Lamiyah Schlotter, PA-C  buPROPion (WELLBUTRIN) 75 MG tablet Take 75 mg by mouth 2 (two) times daily.     Historical Provider, MD  chlorpheniramine-HYDROcodone (TUSSIONEX PENNKINETIC ER) 10-8 MG/5ML SUER Take 5 mLs by mouth at bedtime as needed for cough. 11/24/14   Cederick Broadnax R Margueritte Guthridge, PA-C  famotidine-calcium carbonate-magnesium hydroxide (PEPCID COMPLETE) 10-800-165 MG CHEW chewable tablet Chew 1 tablet by mouth daily as needed.    Historical Provider, MD  ipratropium (ATROVENT) 0.03 % nasal spray Place 2 sprays into both nostrils 2 (two) times daily. 11/24/14   Deshanna Kama R Kenita Bines, PA-C  methocarbamol (ROBAXIN) 500 MG tablet Take 1 tablet (500 mg total) by mouth 2 (two) times daily. Patient not taking: Reported on 10/20/2014 10/19/14   Jarrett Soho Muthersbaugh, PA-C  omeprazole (PRILOSEC) 40 MG capsule Take 1 capsule (40 mg total) by mouth daily. 05/09/12   Rosana Hoes, MD  QUEtiapine (SEROQUEL) 25 MG tablet Take 25 mg by mouth at bedtime. Taking 1-2 tablets QHS  Historical Provider, MD  zolpidem (AMBIEN) 10 MG tablet Take 10 mg by mouth at bedtime as needed for sleep.    Historical Provider, MD   Meds Ordered and Administered this Visit   BP 111/76 mmHg  Pulse 74  Temp(Src) 98.1 F (36.7 C) (Oral)  Resp 24  SpO2 96%  LMP 04/09/2011 No data found.   Physical Exam  Constitutional: She is oriented to person, place, and time. She appears well-developed and well-nourished. No distress.  Blood pressure 111/76, pulse 74, temperature 98.1 F (36.7 C), temperature source Oral, resp. rate 24, last menstrual period 04/09/2011, SpO2 96 %.   HENT:  Head: Normocephalic and atraumatic.  Right Ear: Tympanic membrane, external ear and ear canal normal. No mastoid tenderness. Tympanic membrane is not injected, not scarred, not  perforated, not erythematous, not retracted and not bulging. No middle ear effusion. No hemotympanum.  Left Ear: External ear and ear canal normal. No mastoid tenderness. Tympanic membrane is not injected, not scarred, not perforated, not erythematous, not retracted and not bulging. A middle ear effusion (serous fluid) is present. No hemotympanum.  Nose: Rhinorrhea (with erythema) present. Right sinus exhibits no maxillary sinus tenderness and no frontal sinus tenderness. Left sinus exhibits no maxillary sinus tenderness and no frontal sinus tenderness.  Mouth/Throat: Uvula is midline and mucous membranes are normal. Posterior oropharyngeal erythema (mild) present. No oropharyngeal exudate or posterior oropharyngeal edema.  Eyes: Conjunctivae are normal. Pupils are equal, round, and reactive to light. Right eye exhibits no discharge. Left eye exhibits no discharge. No scleral icterus.  Neck: Normal range of motion. Neck supple. No thyromegaly present.  Cardiovascular: Normal rate, regular rhythm and normal heart sounds.  Exam reveals no gallop and no friction rub.   No murmur heard. Pulmonary/Chest: Effort normal and breath sounds normal. No respiratory distress. She has no decreased breath sounds. She has no wheezes. She has no rhonchi. She has no rales.  Abdominal: Soft. Bowel sounds are normal. She exhibits no distension. There is no tenderness. There is no rebound and no guarding.  Lymphadenopathy:    She has cervical adenopathy.  Neurological: She is alert and oriented to person, place, and time.  Skin: Skin is warm and dry. No rash noted. She is not diaphoretic. No erythema.    ED Course  Procedures (including critical care time)  Labs Review Labs Reviewed - No data to display    MDM   1. Upper respiratory disease    Problem List Items Addressed This Visit    None    Visit Diagnoses    Upper respiratory disease    -  Primary    Relevant Medications    benzonatate (TESSALON)  100 MG capsule    chlorpheniramine-HYDROcodone (TUSSIONEX PENNKINETIC ER) 10-8 MG/5ML SUER    ipratropium (ATROVENT) 0.03 % nasal spray     Plenty of fluid and rest. May use ibuprofen for pain secondary to cough prn.    Nolene Bernheim, PA-C 11/24/14 1605

## 2014-12-01 ENCOUNTER — Ambulatory Visit
Admission: RE | Admit: 2014-12-01 | Discharge: 2014-12-01 | Disposition: A | Discharge status: Home / Self Care | Payer: Medicaid Other | Source: Ambulatory Visit | Attending: Neurology | Admitting: Neurology

## 2014-12-01 DIAGNOSIS — R269 Unspecified abnormalities of gait and mobility: Secondary | ICD-10-CM

## 2014-12-02 ENCOUNTER — Telehealth: Payer: Self-pay | Admitting: Neurology

## 2014-12-02 DIAGNOSIS — W19XXXD Unspecified fall, subsequent encounter: Secondary | ICD-10-CM

## 2014-12-02 NOTE — Telephone Encounter (Signed)
  I called patient. MRI the brain really shows minimal small vessel disease, there is some involvement in the pons, but the clinical examination shows a relatively normal gait, yet the patient will suddenly fall multiple times during the day. Etiology of this is not clear, I will get an EEG study.  MRI brain 12/01/14:  IMPRESSION: 1. No acute intracranial abnormality or mass. 2. 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 Considerations include chronic small vessel ischemia, sequelae of trauma, hypercoagulable state, vasculitis, migraines, prior infection or less likely demyelination.

## 2014-12-11 ENCOUNTER — Encounter: Payer: Self-pay | Admitting: Internal Medicine

## 2014-12-11 ENCOUNTER — Other Ambulatory Visit (HOSPITAL_COMMUNITY)
Admission: RE | Admit: 2014-12-11 | Discharge: 2014-12-11 | Disposition: A | Discharge status: Home / Self Care | Payer: Medicaid Other | Source: Ambulatory Visit | Attending: Internal Medicine | Admitting: Internal Medicine

## 2014-12-11 ENCOUNTER — Ambulatory Visit: Payer: Medicaid Other | Attending: Internal Medicine | Admitting: Internal Medicine

## 2014-12-11 VITALS — BP 125/86 | HR 91 | Temp 98.0°F | Resp 16 | Wt 159.4 lb

## 2014-12-11 DIAGNOSIS — Z01419 Encounter for gynecological examination (general) (routine) without abnormal findings: Secondary | ICD-10-CM | POA: Diagnosis present

## 2014-12-11 DIAGNOSIS — Z124 Encounter for screening for malignant neoplasm of cervix: Secondary | ICD-10-CM

## 2014-12-11 DIAGNOSIS — Z113 Encounter for screening for infections with a predominantly sexual mode of transmission: Secondary | ICD-10-CM | POA: Insufficient documentation

## 2014-12-11 DIAGNOSIS — Z1151 Encounter for screening for human papillomavirus (HPV): Secondary | ICD-10-CM | POA: Insufficient documentation

## 2014-12-11 DIAGNOSIS — N760 Acute vaginitis: Secondary | ICD-10-CM | POA: Diagnosis present

## 2014-12-11 DIAGNOSIS — N898 Other specified noninflammatory disorders of vagina: Secondary | ICD-10-CM

## 2014-12-11 DIAGNOSIS — L298 Other pruritus: Secondary | ICD-10-CM

## 2014-12-11 LAB — POCT URINALYSIS DIPSTICK
Bilirubin, UA: NEGATIVE
Blood, UA: NEGATIVE
Glucose, UA: NEGATIVE
Ketones, UA: NEGATIVE
Nitrite, UA: NEGATIVE
PH UA: 6
PROTEIN UA: NEGATIVE
SPEC GRAV UA: 1.02
Urobilinogen, UA: 1

## 2014-12-11 MED ORDER — FLUCONAZOLE 150 MG PO TABS: 150.0000 mg | ORAL_TABLET | Freq: Once | ORAL | Status: DC

## 2014-12-11 MED ORDER — NYSTATIN 100000 UNIT/GM EX POWD: CUTANEOUS | Status: DC

## 2014-12-11 NOTE — Progress Notes (Signed)
Patient complains of having vaginal itching with an odor Also complains of urine retention and burning when she voids

## 2014-12-11 NOTE — Progress Notes (Signed)
   Subjective:    Patient ID: Laura Huerta, female    DOB: 1965/08/27, 49 y.o.   MRN: [id number]  Vaginal Itching The patient's primary symptoms include genital itching and a genital odor. The patient's pertinent negatives include no genital lesions, genital rash, pelvic pain or vaginal discharge. This is a new problem. The current episode started in the past 7 days. She is not pregnant. Pertinent negatives include no abdominal pain, dysuria, fever or painful intercourse. It is unknown whether or not her partner has an STD. She uses nothing for contraception.    Review of Systems  Constitutional: Negative for fever.  Gastrointestinal: Negative for abdominal pain.  Genitourinary: Negative for dysuria, vaginal discharge and pelvic pain.       Objective:   Physical Exam  Genitourinary: Uterus normal. There is no tenderness on the right labia. There is no tenderness on the left labia. Cervix exhibits no motion tenderness, no discharge and no friability. No tenderness in the vagina. No vaginal discharge found.  Lymphadenopathy:       Right: No inguinal adenopathy present.       Left: No inguinal adenopathy present.  Neurological: She is alert. She has normal reflexes.  Skin: Skin is warm.      Assessment & Plan:  Laura Huerta was seen today for vaginal itching.  Diagnoses and all orders for this visit:  Vaginal itching -     Urinalysis Dipstick -     Cytology - PAP [place] -     fluconazole (DIFLUCAN) 150 MG tablet; Take 1 tablet (150 mg total) by mouth once. -     nystatin (MYCOSTATIN/NYSTOP) 100000 UNIT/GM POWD; May apply twice daily  Papanicolaou smear -     Cytology - PAP [place]  Return if symptoms worsen or fail to improve.  Lance Bosch, NP 12/11/2014 5:27 PM

## 2014-12-14 ENCOUNTER — Telehealth: Payer: Self-pay | Admitting: Internal Medicine

## 2014-12-14 LAB — CERVICOVAGINAL ANCILLARY ONLY
CHLAMYDIA, DNA PROBE: NEGATIVE
Neisseria Gonorrhea: NEGATIVE

## 2014-12-14 LAB — CYTOLOGY - PAP

## 2014-12-14 NOTE — Telephone Encounter (Signed)
Pt. Called requesting a med refill on polyethylene glycol (MIRALAX / GLYCOLAX) packet. Please f/u with pt.

## 2014-12-15 LAB — CERVICOVAGINAL ANCILLARY ONLY: Wet Prep (BD Affirm): NEGATIVE

## 2014-12-15 NOTE — Telephone Encounter (Signed)
Returned patient phone call Patient not available Left message on voice mail to return our call 

## 2014-12-15 NOTE — Telephone Encounter (Signed)
Pt. Came in requesting a med refill no polyethylene glycol (MIRALAX / GLYCOLAX) packet. Please f/u with pt.

## 2014-12-16 ENCOUNTER — Other Ambulatory Visit: Payer: Self-pay

## 2014-12-16 ENCOUNTER — Telehealth: Payer: Self-pay

## 2014-12-16 MED ORDER — POLYETHYLENE GLYCOL 3350 17 GM/SCOOP PO POWD: 17.0000 g | Freq: Two times a day (BID) | ORAL | Status: DC | PRN

## 2014-12-16 NOTE — Telephone Encounter (Signed)
-----   Message from Lance Bosch, NP sent at 12/16/2014  2:08 PM EST ----- Patient pap is negative for malignancies and infections. Will repeat in 3 years.

## 2014-12-16 NOTE — Telephone Encounter (Signed)
Spoke with patient and she is aware of her recent pap results

## 2015-01-11 ENCOUNTER — Other Ambulatory Visit: Payer: Medicaid Other

## 2015-01-12 ENCOUNTER — Ambulatory Visit (INDEPENDENT_AMBULATORY_CARE_PROVIDER_SITE_OTHER): Payer: Medicaid Other | Admitting: Neurology

## 2015-01-12 ENCOUNTER — Telehealth: Payer: Self-pay | Admitting: Neurology

## 2015-01-12 DIAGNOSIS — R2990 Unspecified symptoms and signs involving the nervous system: Secondary | ICD-10-CM

## 2015-01-12 DIAGNOSIS — W19XXXD Unspecified fall, subsequent encounter: Secondary | ICD-10-CM

## 2015-01-12 NOTE — Procedures (Signed)
    History:  Laura Huerta is a 49 year old patient with a history of multiple sudden falls, unassociated with loss of consciousness. The patient believes that the left leg may collapse before the fall. She is being evaluated for the the multiple falling episodes, she indicates that she may fall up to 5 times a day.  This is a routine EEG. No skull defects are noted. Medications include Lotrel, Lipitor, Tessalon, Wellbutrin, Tussionex, Diflucan, Atrovent, Robaxin, Prilosec, Seroquel, and Ambien.   EEG classification: Normal awake  Description of the recording: The background rhythms of this recording consists of a fairly well modulated medium amplitude alpha rhythm of 9 Hz that is reactive to eye opening and closure. As the record progresses, the patient appears to remain in the waking state throughout the recording. Photic stimulation was performed, resulting in a bilateral and symmetric photic driving response. Hyperventilation was also performed, resulting in a minimal buildup of the background rhythm activities without significant slowing seen. At no time during the recording does there appear to be evidence of spike or spike wave discharges or evidence of focal slowing. EKG monitor shows no evidence of cardiac rhythm abnormalities with a heart rate of 56.  Impression: This is a normal EEG recording in the waking state. No evidence of ictal or interictal discharges are seen.

## 2015-01-12 NOTE — Telephone Encounter (Signed)
    I called patient. EEG study is normal. The patient continues to fall, we may consider doing EMG and nerve conduction studies, the patient indicates that the left leg has a tendency to collapse before the fall. MRI showed minimal small vessel disease, nothing that would explain falls or blackouts.

## 2015-01-13 ENCOUNTER — Other Ambulatory Visit: Payer: Self-pay

## 2015-01-13 DIAGNOSIS — Z1231 Encounter for screening mammogram for malignant neoplasm of breast: Secondary | ICD-10-CM

## 2015-01-29 ENCOUNTER — Ambulatory Visit: Payer: Medicaid Other

## 2015-02-04 ENCOUNTER — Encounter: Payer: Self-pay | Admitting: Family Medicine

## 2015-02-04 ENCOUNTER — Ambulatory Visit: Payer: Medicaid Other | Attending: Family Medicine | Admitting: Family Medicine

## 2015-02-04 VITALS — BP 126/77 | HR 82 | Temp 98.5°F | Resp 18 | Ht 63.0 in | Wt 159.4 lb

## 2015-02-04 DIAGNOSIS — Z23 Encounter for immunization: Secondary | ICD-10-CM | POA: Diagnosis not present

## 2015-02-04 DIAGNOSIS — J189 Pneumonia, unspecified organism: Secondary | ICD-10-CM | POA: Diagnosis not present

## 2015-02-04 DIAGNOSIS — R05 Cough: Secondary | ICD-10-CM | POA: Insufficient documentation

## 2015-02-04 DIAGNOSIS — F17210 Nicotine dependence, cigarettes, uncomplicated: Secondary | ICD-10-CM | POA: Insufficient documentation

## 2015-02-04 DIAGNOSIS — J181 Lobar pneumonia, unspecified organism: Secondary | ICD-10-CM

## 2015-02-04 MED ORDER — AZITHROMYCIN 250 MG PO TABS: ORAL_TABLET | ORAL | Status: DC

## 2015-02-04 MED ORDER — CEFTRIAXONE SODIUM 1 G IJ SOLR
1.0000 g | Freq: Once | INTRAMUSCULAR | Status: AC
  Administered 2015-02-04: 1 g via INTRAMUSCULAR

## 2015-02-04 MED ORDER — ALBUTEROL SULFATE HFA 108 (90 BASE) MCG/ACT IN AERS: 2.0000 | INHALATION_SPRAY | Freq: Four times a day (QID) | RESPIRATORY_TRACT | Status: DC | PRN

## 2015-02-04 NOTE — Progress Notes (Signed)
   Subjective:    Patient ID: Laura Huerta, female    DOB: [birth year], 49 y.o.   MRN: [id number]  HPI Complains of 8 days cough with mucus/sputum production. No shortness of breath. Worse in the morning when she wakes up. Nasal discharge initially, now better. Spells of coughing. No fevers or chills. No sick contacts. No flu shot. Smokes cigarettes, 1ppd.   ROS: No fevers, some diarrhea earlier in illness which has resolved.  No dyspnea. Decreased cigarette smoking since becoming ill.   Review of Systems     Objective:   Physical Exam Generally well appearing, no distress No evidence of increased work of breathing.  HEENT TMs clear bilat. Neck supple, no cervical adenopathy. No frontal or maxillary sinus tenderness. Clear oropharynx no exudates.  COR Regular S1S2 PULM Good air movement, no wheezes. CRACKLES HEARD IN L LUNG BASE.         Assessment & Plan:

## 2015-02-04 NOTE — Progress Notes (Signed)
Patient here for cough.  Patient complains of cough being present for 8 days. Patient states cough began as productive cough and now is dry with pressure in her sinus cavity. Patient has used tessalon, cough syrup and has had only a slight relief.  Patient will not receive flu shot with cold symptoms.  Patient tolerated injection well.

## 2015-02-04 NOTE — Patient Instructions (Signed)
It was a pleasure to see you today.  Based on your exam and history, I am making a diagnosis of early-onset pneumonia.   You are receiving an injection of CEFTRIAXONE 1gram, in the office today.   Prescriptions sent to the [facility]:  1. Azithromycin 250mg  tablets, take 2 tablets by mouth on the first day, then 1 tablet daily for 4 additional days.   2. Albuterol inhaler, 2 puffs by mouth every 4 to 6 hours as needed for cough and shortness of breath.   Follow up in this office in 2 weeks, or sooner if worsening.

## 2015-02-25 ENCOUNTER — Ambulatory Visit (HOSPITAL_BASED_OUTPATIENT_CLINIC_OR_DEPARTMENT_OTHER): Payer: Medicaid Other | Admitting: Internal Medicine

## 2015-02-25 ENCOUNTER — Ambulatory Visit (HOSPITAL_COMMUNITY)
Admission: RE | Admit: 2015-02-25 | Discharge: 2015-02-25 | Disposition: A | Discharge status: Home / Self Care | Payer: Medicaid Other | Source: Ambulatory Visit | Attending: Internal Medicine | Admitting: Internal Medicine

## 2015-02-25 ENCOUNTER — Encounter: Payer: Self-pay | Admitting: Internal Medicine

## 2015-02-25 VITALS — BP 123/86 | HR 80 | Temp 98.0°F | Resp 16 | Ht 63.0 in | Wt 162.2 lb

## 2015-02-25 DIAGNOSIS — F17200 Nicotine dependence, unspecified, uncomplicated: Secondary | ICD-10-CM

## 2015-02-25 DIAGNOSIS — R0789 Other chest pain: Secondary | ICD-10-CM | POA: Insufficient documentation

## 2015-02-25 DIAGNOSIS — R05 Cough: Secondary | ICD-10-CM | POA: Insufficient documentation

## 2015-02-25 DIAGNOSIS — E669 Obesity, unspecified: Secondary | ICD-10-CM

## 2015-02-25 DIAGNOSIS — J209 Acute bronchitis, unspecified: Secondary | ICD-10-CM | POA: Diagnosis not present

## 2015-02-25 LAB — POCT GLYCOSYLATED HEMOGLOBIN (HGB A1C): HEMOGLOBIN A1C: 6.1

## 2015-02-25 MED ORDER — GUAIFENESIN ER 600 MG PO TB12: 600.0000 mg | ORAL_TABLET | Freq: Two times a day (BID) | ORAL | Status: DC | PRN

## 2015-02-25 MED ORDER — PREDNISONE 20 MG PO TABS: 20.0000 mg | ORAL_TABLET | Freq: Every day | ORAL | Status: DC

## 2015-02-25 NOTE — Progress Notes (Signed)
Patient ID: Laura Huerta, female   DOB: 06-Sep-1965, 50 y.o.   MRN: [id number]  CC: pneumonia f/u  HPI: Laura Huerta is a 50 y.o. female here today for a follow up visit.  Patient has past medical history of HTN, Depression, HLD, bronchitis. Patient was evaluated here 2 weeks ago for cough and chest discomfort. At that time she was treated for pneumonia and given a injection of Rocephin and a script for azithromax. She states that she has completed all the oral antibiotics but she continues to have a cough. She coughs all day and feels SOB. She reports that after she coughs she has some soreness on her sides. She denies symptoms of rhinitis, fever, chills, sore throat.   No Known Allergies Past Medical History  Diagnosis Date  . Acid reflux   . Bronchitis   . Hypertension   . Depression   . Anxiety   . Hyperlipidemia   . Abnormality of gait 11/18/2014   Current Outpatient Prescriptions on File Prior to Visit  Medication Sig Dispense Refill  . albuterol (PROVENTIL HFA;VENTOLIN HFA) 108 (90 Base) MCG/ACT inhaler Inhale 2 puffs into the lungs every 6 (six) hours as needed for wheezing or shortness of breath. 1 Inhaler 2  . amLODipine-benazepril (LOTREL) 10-20 MG per capsule Take 1 capsule by mouth daily. 30 capsule 4  . atorvastatin (LIPITOR) 10 MG tablet Take 1 tablet (10 mg total) by mouth daily. 90 tablet 3  . buPROPion (WELLBUTRIN) 75 MG tablet Take 75 mg by mouth 2 (two) times daily.     Marland Kitchen omeprazole (PRILOSEC) 40 MG capsule Take 1 capsule (40 mg total) by mouth daily. 30 capsule 2  . zolpidem (AMBIEN) 10 MG tablet Take 10 mg by mouth at bedtime as needed for sleep.    Marland Kitchen azithromycin (ZITHROMAX) 250 MG tablet Take 2 tablets by mouth on Day One, then 1 tablet daily for 4 additional days. (Patient not taking: Reported on 02/25/2015) 6 tablet 0  . benzonatate (TESSALON) 100 MG capsule Take 1-2 capsules (100-200 mg total) by mouth 3 (three) times daily as needed for cough. (Patient  not taking: Reported on 02/04/2015) 40 capsule 0  . chlorpheniramine-HYDROcodone (TUSSIONEX PENNKINETIC ER) 10-8 MG/5ML SUER Take 5 mLs by mouth at bedtime as needed for cough. (Patient not taking: Reported on 02/04/2015) 75 mL 0  . famotidine-calcium carbonate-magnesium hydroxide (PEPCID COMPLETE) 10-800-165 MG CHEW chewable tablet Chew 1 tablet by mouth daily as needed. Reported on 02/25/2015    . fluconazole (DIFLUCAN) 150 MG tablet Take 1 tablet (150 mg total) by mouth once. (Patient not taking: Reported on 02/04/2015) 1 tablet 0  . guaiFENesin-codeine 100-10 MG/5ML syrup Take 5 mLs by mouth 3 (three) times daily as needed for cough. (Patient not taking: Reported on 02/04/2015) 75 mL 0  . ipratropium (ATROVENT) 0.03 % nasal spray Place 2 sprays into both nostrils 2 (two) times daily. (Patient not taking: Reported on 02/25/2015) 30 mL 0  . methocarbamol (ROBAXIN) 500 MG tablet Take 1 tablet (500 mg total) by mouth 2 (two) times daily. (Patient not taking: Reported on 10/20/2014) 20 tablet 0  . nystatin (MYCOSTATIN/NYSTOP) 100000 UNIT/GM POWD May apply twice daily (Patient not taking: Reported on 02/04/2015) 30 g 1  . polyethylene glycol powder (GLYCOLAX/MIRALAX) powder Take 17 g by mouth 2 (two) times daily as needed. (Patient not taking: Reported on 02/25/2015) 250 g 1  . QUEtiapine (SEROQUEL) 25 MG tablet Take 25 mg by mouth at bedtime. Reported on 02/25/2015  No current facility-administered medications on file prior to visit.   Family History  Problem Relation Age of Onset  . Hypertension Mother   . Heart failure Mother     Died age 73  . COPD Mother   . Asthma Mother   . Diabetes Mother   . Depression Mother   . Hypertension Father   . Diabetes Father   . Breast cancer Maternal Aunt    Social History   Social History  . Marital Status: Single    Spouse Name: N/A  . Number of Children: 1  . Years of Education: 7   Occupational History  . CNA    Social History Main Topics  .  Smoking status: Current Every Day Smoker -- 1.00 packs/day for 10 years    Types: Cigarettes  . Smokeless tobacco: Never Used  . Alcohol Use: 0.0 oz/week    0 Standard drinks or equivalent per week     Comment: 40-60 oz. per week  . Drug Use: No  . Sexual Activity: Yes    Birth Control/ Protection: Surgical   Other Topics Concern  . Not on file   Social History Narrative   Lives with son.     Patient drinks about 3 glasses of tea daily.   Patient is right handed.     Review of Systems: Other than what is stated in HPI, all other systems are negative.   Objective:   Filed Vitals:   02/25/15 0910  BP: 123/86  Pulse: 80  Temp: 98 F (36.7 C)  Resp: 16    Physical Exam  Constitutional: She is oriented to person, place, and time.  HENT:  Right Ear: External ear normal.  Left Ear: External ear normal.  Mouth/Throat: Oropharynx is clear and moist.  Cardiovascular: Normal rate, regular rhythm and normal heart sounds.   Pulmonary/Chest: Effort normal and breath sounds normal. She has no wheezes. She exhibits no tenderness.  Lymphadenopathy:    She has no cervical adenopathy.  Neurological: She is alert and oriented to person, place, and time.  Skin: Skin is warm and dry.  Psychiatric: She has a normal mood and affect.     Lab Results  Component Value Date   WBC 8.5 10/23/2014   HGB 12.6 10/23/2014   HCT 41.1 10/23/2014   MCV 75.3* 10/23/2014   PLT 317 10/23/2014   Lab Results  Component Value Date   CREATININE 0.76 10/23/2014   BUN 8 10/23/2014   NA 141 10/23/2014   K 4.4 10/23/2014   CL 104 10/23/2014   CO2 25 10/23/2014    Lab Results  Component Value Date   HGBA1C 6.10 02/25/2015   Lipid Panel     Component Value Date/Time   CHOL 181 10/23/2014 0914   TRIG 69 10/23/2014 0914   HDL 54 10/23/2014 0914   CHOLHDL 3.4 10/23/2014 0914   VLDL 14 10/23/2014 0914   LDLCALC 113 10/23/2014 0914       Assessment and plan:   Laura Huerta was seen today  for follow-up.  Diagnoses and all orders for this visit:  Acute bronchitis, unspecified organism -     DG Chest 2 View; Future -     guaiFENesin (MUCINEX) 600 MG 12 hr tablet; Take 1 tablet (600 mg total) by mouth 2 (two) times daily as needed. For cough and loosen mucous -     predniSONE (DELTASONE) 20 MG tablet; Take 1 tablet (20 mg total) by mouth daily with breakfast. For 5 days  Will get chest xray. Will treat for bronchitis, feel like any infections should have been covered by the dual antibiotic therapy.   Tobacco use disorder Patient reports that she has tried Chantix, Wellbutrin, patches, and gum without success. I have explained to patient that the medication can not do all the work that she has to be determined and set a quit date. We have discussed a plan to wean off cigarettes. Behavioral modifications discussed as well.   Obesity -     HgB A1c--6.1% Weight loss, exercise, and diet discussed      Return if symptoms worsen or fail to improve.   Lance Bosch, [facility] [phone] 02/25/2015, 9:19 AM

## 2015-02-25 NOTE — Progress Notes (Signed)
Patient here for follow up on her pneumonia Has finished her ABT and still having a cough and chest discomfort When she breathes in

## 2015-02-25 NOTE — Patient Instructions (Signed)

## 2015-02-26 ENCOUNTER — Telehealth: Payer: Self-pay

## 2015-02-26 NOTE — Telephone Encounter (Signed)
Returned patient phone call Patient was wanting a RX for smoking patches Informed patient the patches are OTC

## 2015-03-01 ENCOUNTER — Telehealth: Payer: Self-pay

## 2015-03-01 NOTE — Telephone Encounter (Signed)
Spoke with patient this am and she is aware of her results Patient is aware she needs to quit smoking the sooner the better Patient was instructed to try some OTC nicotine patches to help her

## 2015-03-01 NOTE — Telephone Encounter (Signed)
-----   Message from Lance Bosch, NP sent at 02/26/2015  5:52 PM EST ----- Bronchitis noted like we discussed in office visit. She is also showing lung disease that affect the air space of her lungs. This is basically early lung disease. She needs to quit smoking ASAP. She will need to buy OTC nicotine patches and get serious about quitting

## 2015-03-09 ENCOUNTER — Ambulatory Visit: Payer: Medicaid Other | Attending: Family Medicine | Admitting: Family Medicine

## 2015-03-09 ENCOUNTER — Encounter: Payer: Self-pay | Admitting: Family Medicine

## 2015-03-09 VITALS — BP 113/79 | HR 74 | Temp 97.6°F | Resp 16 | Ht 63.0 in | Wt 157.0 lb

## 2015-03-09 DIAGNOSIS — R7303 Prediabetes: Secondary | ICD-10-CM | POA: Diagnosis not present

## 2015-03-09 DIAGNOSIS — W57XXXA Bitten or stung by nonvenomous insect and other nonvenomous arthropods, initial encounter: Secondary | ICD-10-CM

## 2015-03-09 DIAGNOSIS — R21 Rash and other nonspecific skin eruption: Secondary | ICD-10-CM | POA: Diagnosis not present

## 2015-03-09 DIAGNOSIS — T148 Other injury of unspecified body region: Secondary | ICD-10-CM | POA: Diagnosis not present

## 2015-03-09 DIAGNOSIS — I10 Essential (primary) hypertension: Secondary | ICD-10-CM | POA: Insufficient documentation

## 2015-03-09 DIAGNOSIS — Z79899 Other long term (current) drug therapy: Secondary | ICD-10-CM | POA: Diagnosis not present

## 2015-03-09 LAB — GLUCOSE, POCT (MANUAL RESULT ENTRY): POC Glucose: 112 mg/dl — AB (ref 70–99)

## 2015-03-09 MED ORDER — HYDROCORTISONE 2.5 % EX CREA: TOPICAL_CREAM | Freq: Two times a day (BID) | CUTANEOUS | Status: DC

## 2015-03-09 NOTE — Progress Notes (Signed)
Patient c/o bug bite on left forearm.  Patients states that it's very itchy,with swelling, painful x3days. Pain rated 2/10.

## 2015-03-09 NOTE — Progress Notes (Signed)
Subjective:  Patient ID: Laura Huerta, female    DOB: Sep 29, 1965  Age: 50 y.o. MRN: [id number]  CC: Insect Bite   HPI Laura Huerta is a 50 year old female with a history of hypertension who comes into the clinic complaining of a five-day history of a pruritic rash on her left forearm at the site of the bug bite which has not responded to over-the-counter Benadryl and hydrocortisone creams. She denies fever or rash in other body parts. The rash is not spreading and not increasing in size.  Outpatient Prescriptions Prior to Visit  Medication Sig Dispense Refill  . albuterol (PROVENTIL HFA;VENTOLIN HFA) 108 (90 Base) MCG/ACT inhaler Inhale 2 puffs into the lungs every 6 (six) hours as needed for wheezing or shortness of breath. 1 Inhaler 2  . amLODipine-benazepril (LOTREL) 10-20 MG per capsule Take 1 capsule by mouth daily. 30 capsule 4  . atorvastatin (LIPITOR) 10 MG tablet Take 1 tablet (10 mg total) by mouth daily. 90 tablet 3  . buPROPion (WELLBUTRIN) 75 MG tablet Take 75 mg by mouth 2 (two) times daily.     Marland Kitchen omeprazole (PRILOSEC) 40 MG capsule Take 1 capsule (40 mg total) by mouth daily. 30 capsule 2  . polyethylene glycol powder (GLYCOLAX/MIRALAX) powder Take 17 g by mouth 2 (two) times daily as needed. 250 g 1  . zolpidem (AMBIEN) 10 MG tablet Take 10 mg by mouth at bedtime as needed for sleep.    Marland Kitchen guaiFENesin (MUCINEX) 600 MG 12 hr tablet Take 1 tablet (600 mg total) by mouth 2 (two) times daily as needed. For cough and loosen mucous (Patient not taking: Reported on 03/09/2015) 60 tablet 0  . predniSONE (DELTASONE) 20 MG tablet Take 1 tablet (20 mg total) by mouth daily with breakfast. For 5 days (Patient not taking: Reported on 03/09/2015) 5 tablet 0  . QUEtiapine (SEROQUEL) 25 MG tablet Take 25 mg by mouth at bedtime. Reported on 03/09/2015     No facility-administered medications prior to visit.    ROS Review of Systems  Constitutional: Negative for activity change  and appetite change.  HENT: Negative for sinus pressure and sore throat.   Respiratory: Negative for chest tightness, shortness of breath and wheezing.   Cardiovascular: Negative for chest pain and palpitations.  Gastrointestinal: Negative for abdominal pain, constipation and abdominal distention.  Genitourinary: Negative.   Musculoskeletal: Negative.   Skin:       See hpi  Psychiatric/Behavioral: Negative for behavioral problems and dysphoric mood.    Objective:  BP 113/79 mmHg  Pulse 74  Temp(Src) 97.6 F (36.4 C) (Oral)  Resp 16  Ht 5\' 3"  (1.6 m)  Wt 157 lb (71.215 kg)  BMI 27.82 kg/m2  SpO2 96%  LMP 04/09/2011  BP/Weight 03/09/2015 02/25/2015 [id number]  Systolic BP 123456 [id number]  Diastolic BP 79 86 77  Wt. (Lbs) 157 162.2 159.4  BMI 27.82 28.74 28.24      Physical Exam  Constitutional: She is oriented to person, place, and time. She appears well-developed and well-nourished.  Cardiovascular: Normal rate, normal heart sounds and intact distal pulses.   No murmur heard. Pulmonary/Chest: Effort normal and breath sounds normal. She has no wheezes. She has no rales. She exhibits no tenderness.  Abdominal: Soft. Bowel sounds are normal. She exhibits no distension and no mass. There is no tenderness.  Musculoskeletal: Normal range of motion.  Neurological: She is alert and oriented to person, place, and time.  Skin:  Mildly erythematous rash on the left  dorsal aspect of forearm     Assessment & Plan:   1. Prediabetes  A1c of 6.1 - Glucose (CBG)  2. Insect bite Advised to abstain from his scratching as much as possible - hydrocortisone 2.5 % cream; Apply topically 2 (two) times daily.  Dispense: 30 g; Refill: 0   Meds ordered this encounter  Medications  . hydrocortisone 2.5 % cream    Sig: Apply topically 2 (two) times daily.    Dispense:  30 g    Refill:  0    Follow-up: Return in about 3 weeks (around 03/30/2015) for follow up of hypertension with PCP-  Laura Huerta.   Arnoldo Morale MD

## 2015-03-11 ENCOUNTER — Telehealth: Payer: Self-pay

## 2015-03-11 ENCOUNTER — Telehealth: Payer: Self-pay | Admitting: Internal Medicine

## 2015-03-11 NOTE — Telephone Encounter (Signed)
Patient called requesting to speak to nurse regarding insect bite, patient states medication given to her is not helping patient is experiencing itching, swelling and burning. Please f/u with patient.

## 2015-03-11 NOTE — Telephone Encounter (Signed)
Returned phone call to patient  Patient states her bug bite has gotten worse Burning swollen itching Patient states the cream is not working warm compresses not helping And either is benadryl Instructed patient she will need an appt. To come in to be re evaluated

## 2015-03-12 ENCOUNTER — Encounter: Payer: Self-pay | Admitting: Family Medicine

## 2015-03-12 ENCOUNTER — Ambulatory Visit: Payer: Medicaid Other | Attending: Family Medicine | Admitting: Family Medicine

## 2015-03-12 VITALS — BP 126/84 | HR 82 | Temp 98.6°F | Resp 16 | Ht 63.0 in | Wt 159.0 lb

## 2015-03-12 DIAGNOSIS — R21 Rash and other nonspecific skin eruption: Secondary | ICD-10-CM | POA: Diagnosis present

## 2015-03-12 DIAGNOSIS — B029 Zoster without complications: Secondary | ICD-10-CM

## 2015-03-12 DIAGNOSIS — I10 Essential (primary) hypertension: Secondary | ICD-10-CM | POA: Diagnosis present

## 2015-03-12 DIAGNOSIS — Z79899 Other long term (current) drug therapy: Secondary | ICD-10-CM | POA: Diagnosis not present

## 2015-03-12 MED ORDER — ACYCLOVIR 400 MG PO TABS: 400.0000 mg | ORAL_TABLET | Freq: Every day | ORAL | Status: DC

## 2015-03-12 NOTE — Patient Instructions (Signed)

## 2015-03-12 NOTE — Progress Notes (Signed)
Patient states that her Insect bite from last OV is causing burning, swelling, and some itchiness. Rated 1/10.  Patient has no further concerns.

## 2015-03-12 NOTE — Telephone Encounter (Signed)
Patient was seen by Dr. Jarold Song today.

## 2015-03-12 NOTE — Progress Notes (Signed)
Subjective:  Patient ID: Laura Huerta, female    DOB: Mar 22, 1965  Age: 50 y.o. MRN: [id number]  CC: No chief complaint on file.   HPI Laura Huerta is a 50 year old female with a history of hypertension who was seen 4 days ago with a left elbow rash which she had described as pruritic at the time and received  hydrocortisone. She returns today stating hydrocortisone is not helping and the rash is burning and painful and hurts even when "air touches it"; she denies any discharge from the rash or fever. There is no rash in other body parts.  Outpatient Prescriptions Prior to Visit  Medication Sig Dispense Refill  . albuterol (PROVENTIL HFA;VENTOLIN HFA) 108 (90 Base) MCG/ACT inhaler Inhale 2 puffs into the lungs every 6 (six) hours as needed for wheezing or shortness of breath. 1 Inhaler 2  . amLODipine-benazepril (LOTREL) 10-20 MG per capsule Take 1 capsule by mouth daily. 30 capsule 4  . atorvastatin (LIPITOR) 10 MG tablet Take 1 tablet (10 mg total) by mouth daily. 90 tablet 3  . buPROPion (WELLBUTRIN) 75 MG tablet Take 75 mg by mouth 2 (two) times daily. Reported on 03/12/2015    . hydrocortisone 2.5 % cream Apply topically 2 (two) times daily. 30 g 0  . omeprazole (PRILOSEC) 40 MG capsule Take 1 capsule (40 mg total) by mouth daily. 30 capsule 2  . polyethylene glycol powder (GLYCOLAX/MIRALAX) powder Take 17 g by mouth 2 (two) times daily as needed. 250 g 1  . zolpidem (AMBIEN) 10 MG tablet Take 10 mg by mouth at bedtime as needed for sleep.    Marland Kitchen guaiFENesin (MUCINEX) 600 MG 12 hr tablet Take 1 tablet (600 mg total) by mouth 2 (two) times daily as needed. For cough and loosen mucous (Patient not taking: Reported on 03/09/2015) 60 tablet 0  . predniSONE (DELTASONE) 20 MG tablet Take 1 tablet (20 mg total) by mouth daily with breakfast. For 5 days (Patient not taking: Reported on 03/09/2015) 5 tablet 0  . QUEtiapine (SEROQUEL) 25 MG tablet Take 25 mg by mouth at bedtime. Reported on  03/12/2015     No facility-administered medications prior to visit.    ROS Review of Systems  Constitutional: Negative for activity change and appetite change.  HENT: Negative for sinus pressure and sore throat.   Respiratory: Negative for chest tightness, shortness of breath and wheezing.   Cardiovascular: Negative for chest pain and palpitations.  Gastrointestinal: Negative for abdominal pain, constipation and abdominal distention.  Genitourinary: Negative.   Musculoskeletal: Negative.   Skin:       See history of present illness  Psychiatric/Behavioral: Negative for behavioral problems and dysphoric mood.    Objective:  BP 126/84 mmHg  Pulse 82  Temp(Src) 98.6 F (37 C) (Oral)  Resp 16  Ht 5\' 3"  (1.6 m)  Wt 159 lb (72.122 kg)  BMI 28.17 kg/m2  SpO2 97%  LMP 04/09/2011  BP/Weight 03/12/2015 03/09/2015 123456  Systolic BP 123XX123 123456 [id number]  Diastolic BP 84 79 86  Wt. (Lbs) 159 157 162.2  BMI 28.17 27.82 28.74      Physical Exam  Constitutional: She is oriented to person, place, and time. She appears well-developed and well-nourished.  Cardiovascular: Normal rate, normal heart sounds and intact distal pulses.   No murmur heard. Pulmonary/Chest: Effort normal and breath sounds normal. She has no wheezes. She has no rales. She exhibits no tenderness.  Abdominal: Soft. Bowel sounds are normal. She exhibits no distension and no  mass. There is no tenderness.  Musculoskeletal: Normal range of motion.  Neurological: She is alert and oriented to person, place, and time.  Skin:  Cluster of rash with one single vesicle and dorsal aspect of left elbow     Assessment & Plan:   1. Shingles Treated Discussed contact precautions. - acyclovir (ZOVIRAX) 400 MG tablet; Take 1 tablet (400 mg total) by mouth 5 (five) times daily.  Dispense: 35 tablet; Refill: 0   Meds ordered this encounter  Medications  . acyclovir (ZOVIRAX) 400 MG tablet    Sig: Take 1 tablet (400 mg total) by  mouth 5 (five) times daily.    Dispense:  35 tablet    Refill:  0    Follow-up: Return in about 3 weeks (around 03/30/2015), or if symptoms worsen or fail to improve, for follow up of Hypertension with PCP- Mateo Flow.   Arnoldo Morale MD

## 2015-03-30 ENCOUNTER — Ambulatory Visit: Payer: Medicaid Other | Attending: Internal Medicine | Admitting: Internal Medicine

## 2015-03-30 ENCOUNTER — Encounter: Payer: Self-pay | Admitting: Internal Medicine

## 2015-03-30 VITALS — BP 117/77 | HR 77 | Temp 97.9°F | Resp 17 | Ht 63.0 in | Wt 162.0 lb

## 2015-03-30 DIAGNOSIS — K219 Gastro-esophageal reflux disease without esophagitis: Secondary | ICD-10-CM | POA: Diagnosis not present

## 2015-03-30 DIAGNOSIS — B029 Zoster without complications: Secondary | ICD-10-CM | POA: Insufficient documentation

## 2015-03-30 DIAGNOSIS — E785 Hyperlipidemia, unspecified: Secondary | ICD-10-CM

## 2015-03-30 DIAGNOSIS — I10 Essential (primary) hypertension: Secondary | ICD-10-CM | POA: Diagnosis not present

## 2015-03-30 DIAGNOSIS — Z79899 Other long term (current) drug therapy: Secondary | ICD-10-CM | POA: Insufficient documentation

## 2015-03-30 LAB — BASIC METABOLIC PANEL
BUN: 7 mg/dL (ref 7–25)
CHLORIDE: 105 mmol/L (ref 98–110)
CO2: 27 mmol/L (ref 20–31)
CREATININE: 0.93 mg/dL (ref 0.50–1.05)
Calcium: 9.5 mg/dL (ref 8.6–10.4)
Glucose, Bld: 86 mg/dL (ref 65–99)
Potassium: 4.1 mmol/L (ref 3.5–5.3)
Sodium: 139 mmol/L (ref 135–146)

## 2015-03-30 MED ORDER — AMLODIPINE BESY-BENAZEPRIL HCL 10-20 MG PO CAPS: 1.0000 | ORAL_CAPSULE | Freq: Every day | ORAL | Status: DC

## 2015-03-30 MED ORDER — ATORVASTATIN CALCIUM 10 MG PO TABS: 10.0000 mg | ORAL_TABLET | Freq: Every day | ORAL | Status: DC

## 2015-03-30 MED ORDER — OMEPRAZOLE 20 MG PO CPDR: 20.0000 mg | DELAYED_RELEASE_CAPSULE | Freq: Every day | ORAL | Status: DC

## 2015-03-30 NOTE — Progress Notes (Signed)
Patient ID: Laura Huerta, female   DOB: 1965/03/11, 50 y.o.   MRN: [id number] Subjective:  Laura Huerta is a 50 y.o. female with hypertension, depression, and recent shingles diagnosis. Patient reports that she was seen earlier this month with a rash and was diagnosed with shingles and has completed her acyclovir. Patient states that area is now improving and is no longer pruritic or painful.   Current Outpatient Prescriptions  Medication Sig Dispense Refill  . amLODipine-benazepril (LOTREL) 10-20 MG per capsule Take 1 capsule by mouth daily. 30 capsule 4  . atorvastatin (LIPITOR) 10 MG tablet Take 1 tablet (10 mg total) by mouth daily. 90 tablet 3  . buPROPion (WELLBUTRIN) 75 MG tablet Take 75 mg by mouth 2 (two) times daily. Reported on 03/12/2015    . omeprazole (PRILOSEC) 40 MG capsule Take 1 capsule (40 mg total) by mouth daily. 30 capsule 2  . zolpidem (AMBIEN) 10 MG tablet Take 10 mg by mouth at bedtime as needed for sleep.    Marland Kitchen acyclovir (ZOVIRAX) 400 MG tablet Take 1 tablet (400 mg total) by mouth 5 (five) times daily. 35 tablet 0  . albuterol (PROVENTIL HFA;VENTOLIN HFA) 108 (90 Base) MCG/ACT inhaler Inhale 2 puffs into the lungs every 6 (six) hours as needed for wheezing or shortness of breath. 1 Inhaler 2  . FLUoxetine (PROZAC) 40 MG capsule Take 40 mg by mouth daily.    Marland Kitchen guaiFENesin (MUCINEX) 600 MG 12 hr tablet Take 1 tablet (600 mg total) by mouth 2 (two) times daily as needed. For cough and loosen mucous (Patient not taking: Reported on 03/09/2015) 60 tablet 0  . hydrocortisone 2.5 % cream Apply topically 2 (two) times daily. 30 g 0  . polyethylene glycol powder (GLYCOLAX/MIRALAX) powder Take 17 g by mouth 2 (two) times daily as needed. 250 g 1  . predniSONE (DELTASONE) 20 MG tablet Take 1 tablet (20 mg total) by mouth daily with breakfast. For 5 days (Patient not taking: Reported on 03/09/2015) 5 tablet 0  . QUEtiapine (SEROQUEL) 25 MG tablet Take 25 mg by mouth at bedtime.  Reported on 03/12/2015     No current facility-administered medications for this visit.    Hypertension ROS: taking medications as instructed, no medication side effects noted, no TIA's, no chest pain on exertion, no dyspnea on exertion, no swelling of ankles, no palpitations and no intermittent claudication symptoms. All other systems negative  Objective:  BP 117/77 mmHg  Pulse 77  Temp(Src) 97.9 F (36.6 C)  Resp 17  Ht 5\' 3"  (1.6 m)  Wt 162 lb (73.483 kg)  BMI 28.70 kg/m2  SpO2 100%  LMP 04/09/2011  Appearance alert, well appearing, and in no distress, oriented to person, place, and time and normal appearing weight. General exam BP noted to be well controlled today in office, S1, S2 normal, no gallop, no murmur, chest clear, no JVD, no HSM, no edema. Resolving shingles rash on left forearm. Lab review: orders written for new lab studies as appropriate; see orders. Lipid panel 5 months ago WNL.  Assessment:   [name] was seen today for follow-up.  Diagnoses and all orders for this visit:  Essential hypertension -     amLODipine-benazepril (LOTREL) 10-20 MG capsule; Take 1 capsule by mouth daily. -     Basic Metabolic Panel Patient blood pressure is stable and may continue on current medication.  Education on diet, exercise, and modifiable risk factors discussed. Will obtain appropriate labs as needed. Will follow up in 3-6  months.   HLD (hyperlipidemia) -     atorvastatin (LIPITOR) 10 MG tablet; Take 1 tablet (10 mg total) by mouth daily. Education provided on proper lifestyle changes in order to lower cholesterol. Patient advised to maintain healthy weight and to keep total fat intake at 25-35% of total calories and carbohydrates 50-60% of total daily calories. Explained how high cholesterol places patient at risk for heart disease. Patient placed on appropriate medication and repeat labs in 6 months   Gastroesophageal reflux disease, esophagitis presence not specified -      omeprazole (PRILOSEC) 20 MG capsule; Take 1 capsule (20 mg total) by mouth daily. Stable, meds refilled  Shingles Resolved. Explained that she may get Zostavax vaccine in 6-12 months after outbreak.    Return in about 6 months (around 09/27/2015) for Hypertension.  Lance Bosch, NP 03/30/2015 2:57 PM

## 2015-03-30 NOTE — Progress Notes (Signed)
Patient states she is here for a follow up for her shingles To her left arm

## 2015-04-05 ENCOUNTER — Telehealth: Payer: Self-pay | Admitting: Internal Medicine

## 2015-04-05 ENCOUNTER — Telehealth: Payer: Self-pay

## 2015-04-05 NOTE — Telephone Encounter (Signed)
-----   Message from Lance Bosch, NP sent at 04/04/2015  5:21 PM EST ----- Labs are within normal limits

## 2015-04-05 NOTE — Telephone Encounter (Signed)
Pt. Returned call. Please f/u with pt. °

## 2015-04-05 NOTE — Telephone Encounter (Signed)
Returned patient phone call Patient not available Message left on voice mail to return our call 

## 2015-04-05 NOTE — Telephone Encounter (Signed)
Tried to contact patient Patient not available Message left on voice mail to return our call 

## 2015-05-13 ENCOUNTER — Ambulatory Visit: Payer: Medicaid Other | Attending: Internal Medicine | Admitting: Physician Assistant

## 2015-05-13 ENCOUNTER — Other Ambulatory Visit (HOSPITAL_COMMUNITY)
Admission: RE | Admit: 2015-05-13 | Discharge: 2015-05-13 | Disposition: A | Discharge status: Home / Self Care | Payer: Medicaid Other | Source: Ambulatory Visit | Attending: Physician Assistant | Admitting: Physician Assistant

## 2015-05-13 VITALS — BP 111/74 | HR 80 | Temp 97.9°F | Resp 18 | Ht 63.0 in | Wt 159.0 lb

## 2015-05-13 DIAGNOSIS — Z79899 Other long term (current) drug therapy: Secondary | ICD-10-CM | POA: Diagnosis not present

## 2015-05-13 DIAGNOSIS — N760 Acute vaginitis: Secondary | ICD-10-CM | POA: Diagnosis present

## 2015-05-13 DIAGNOSIS — Z113 Encounter for screening for infections with a predominantly sexual mode of transmission: Secondary | ICD-10-CM | POA: Diagnosis present

## 2015-05-13 DIAGNOSIS — N898 Other specified noninflammatory disorders of vagina: Secondary | ICD-10-CM | POA: Insufficient documentation

## 2015-05-13 DIAGNOSIS — I10 Essential (primary) hypertension: Secondary | ICD-10-CM | POA: Diagnosis not present

## 2015-05-13 LAB — POCT URINALYSIS DIPSTICK
BILIRUBIN UA: NEGATIVE
Blood, UA: NEGATIVE
Glucose, UA: NEGATIVE
KETONES UA: NEGATIVE
LEUKOCYTES UA: NEGATIVE
Nitrite, UA: NEGATIVE
PH UA: 6.5
Protein, UA: NEGATIVE
Spec Grav, UA: 1.015
Urobilinogen, UA: NEGATIVE

## 2015-05-13 MED ORDER — FLUCONAZOLE 150 MG PO TABS: 150.0000 mg | ORAL_TABLET | Freq: Once | ORAL | Status: DC

## 2015-05-13 NOTE — Progress Notes (Signed)
Patient is here for vaginal odor.  Patient denies any pain at this time. Patient denies nay discharge, pain with urination or itching. Odor has been present for a month.

## 2015-05-13 NOTE — Progress Notes (Signed)
Patient ID: Laura Huerta, female   DOB: 07/24/1965, 50 y.o.   MRN: [id number]   Laura Huerta, is a 50 y.o. female  [id number]  [id number]  DOB - [birth year]  Chief Complaint  Patient presents with  . Vaginal Itching        Subjective:  Chief Complaint and HPI: Laura Huerta is a 50 y.o. female here today vaginal odor.  Present X 1 month. She denies vaginal discharge, dyspareunia, or abdominal pain.  She thinks she may have a yeast infection as she recently took a course of Amoxicillin.  This seems similar to when she had a yeast infection previously.    Hypertension and hyperlipidemia are stable and she is tolerating her medications.  She has not been sticking to a low or healthy carb diet.    ROS:   Constitutional:  No f/c, No night sweats, No unexplained weight loss. EENT:  No vision changes, No blurry vision, No hearing changes. No mouth, throat, or ear problems.  Respiratory: No cough, No SOB Cardiac: No CP, no palpitations GI:  No abd pain, No N/V/D. GU: No Urinary s/sx Musculoskeletal: No joint pain Neuro: No headache, no dizziness, no motor weakness.  Skin: No rash Endocrine:  No polydipsia. No polyuria.  Psych: Denies SI/HI  ALLERGIES: No Known Allergies  PAST MEDICAL HISTORY: Past Medical History  Diagnosis Date  . Acid reflux   . Bronchitis   . Hypertension   . Depression   . Anxiety   . Hyperlipidemia   . Abnormality of gait 11/18/2014    MEDICATIONS AT HOME: Prior to Admission medications   Medication Sig Start Date End Date Taking? Authorizing Provider  acyclovir (ZOVIRAX) 400 MG tablet Take 1 tablet (400 mg total) by mouth 5 (five) times daily. 03/12/15  Yes Arnoldo Morale, MD  albuterol (PROVENTIL HFA;VENTOLIN HFA) 108 (90 Base) MCG/ACT inhaler Inhale 2 puffs into the lungs every 6 (six) hours as needed for wheezing or shortness of breath. 02/04/15  Yes Willeen Niece, MD  amLODipine-benazepril (LOTREL) 10-20 MG capsule Take 1  capsule by mouth daily. 03/30/15  Yes Lance Bosch, NP  atorvastatin (LIPITOR) 10 MG tablet Take 1 tablet (10 mg total) by mouth daily. 03/30/15  Yes Lance Bosch, NP  buPROPion (WELLBUTRIN) 75 MG tablet Take 75 mg by mouth 2 (two) times daily. Reported on 03/12/2015   Yes Historical Provider, MD  hydrocortisone 2.5 % cream Apply topically 2 (two) times daily. 03/09/15  Yes Arnoldo Morale, MD  omeprazole (PRILOSEC) 20 MG capsule Take 1 capsule (20 mg total) by mouth daily. 03/30/15  Yes Lance Bosch, NP  polyethylene glycol powder (GLYCOLAX/MIRALAX) powder Take 17 g by mouth 2 (two) times daily as needed. 12/16/14  Yes Lance Bosch, NP  zolpidem (AMBIEN) 10 MG tablet Take 10 mg by mouth at bedtime as needed for sleep.   Yes Historical Provider, MD  fluconazole (DIFLUCAN) 150 MG tablet Take 1 tablet (150 mg total) by mouth once. 05/13/15   Argentina Donovan, PA-C  guaiFENesin (MUCINEX) 600 MG 12 hr tablet Take 1 tablet (600 mg total) by mouth 2 (two) times daily as needed. For cough and loosen mucous Patient not taking: Reported on 03/09/2015 02/25/15   Lance Bosch, NP  predniSONE (DELTASONE) 20 MG tablet Take 1 tablet (20 mg total) by mouth daily with breakfast. For 5 days Patient not taking: Reported on 03/09/2015 02/25/15   Lance Bosch, NP     Objective:  Jasmine December:  Filed Vitals:   05/13/15 1400  BP: 111/74  Pulse: 80  Temp: 97.9 F (36.6 C)  TempSrc: Oral  Resp: 18  Height: 5\' 3"  (1.6 m)  Weight: 159 lb (72.122 kg)  SpO2: 97%    General appearance : A&OX3. NAD. Non-toxic-appearing HEENT: Atraumatic and Normocephalic.  PERRLA. EOM intact.  TM clear B. Mouth-MMM, post pharynx WNL w/o erythema, No PND. Neck: supple, no JVD. No cervical lymphadenopathy. No thyromegaly Chest/Lungs:  Breathing-non-labored, Good air entry bilaterally, breath sounds normal without rales, rhonchi, or wheezing  CVS: S1 S2 regular, no murmurs, gallops, rubs  Abdomen: Bowel sounds present, Non tender and not  distended with no gaurding, rigidity or rebound. Genitalia: Vaginal mucosa appears healthy with normal rugation.  There is a mild amount of yellowish-white discharge present near the cervix.  WP and gen probe taken.  Bimanual exam reveals mild tenderness over the uterus.  Adnexa WNL and non-palpable.  Overall unremarkable. Extremities: B/L Lower Ext shows no edema, both legs are warm to touch with = pulse throughout Neurology:  CN II-XII grossly intact, Non focal.   Psych:  TP linear. J/I WNL. Normal speech. Appropriate eye contact and affect.  Skin:  No Rash  Data Review Lab Results  Component Value Date   HGBA1C 6.10 02/25/2015     Assessment & Plan   1. Vaginal discharge - Cervicovaginal ancillary only - Wet prep, genital - POCT urinalysis dipstick For now, due to recent antibiotics, I will go ahead and send a prescription for Diflucan that she can take.    2. Elevated A1C I have had a lengthy discussion and provided education about insulin resistance and the intake of too much sugar/refined carbohydrates.  I have advised the patient to work at a goal of eliminating sugary drinks, candy, desserts, sweets, refined sugars, processed foods, and white carbohydrates.  The patient expresses understanding.  Spent 45 mins face to face-more than 50% counseling on this.  She has agreed to a goal of replacing 1 Liter of ginger ale daily with Diet ginger ale and she has agreed to increase her water intake over the next month.  She will also start making other changes to her diet.    3. Htn-stable and controlled  Patient have been counseled extensively about nutrition and exercise  Return in about 4 weeks (around 06/10/2015) for 3 month f/up and check A1C.  The patient was given clear instructions to go to ER or return to medical center if symptoms don't improve, worsen or new problems develop. The patient verbalized understanding. The patient was told to call to get lab results if they haven't  heard anything in the next week.     Freeman Caldron, PA-C [facility] [place], [place]   05/13/2015, 2:54 PM

## 2015-05-13 NOTE — Patient Instructions (Signed)

## 2015-05-14 LAB — CERVICOVAGINAL ANCILLARY ONLY
CHLAMYDIA, DNA PROBE: NEGATIVE
NEISSERIA GONORRHEA: NEGATIVE
Wet Prep (BD Affirm): NEGATIVE

## 2015-07-07 ENCOUNTER — Telehealth: Payer: Self-pay | Admitting: Neurology

## 2015-07-07 ENCOUNTER — Telehealth: Payer: Self-pay | Admitting: Internal Medicine

## 2015-07-07 MED ORDER — POLYETHYLENE GLYCOL 3350 17 GM/SCOOP PO POWD: 17.0000 g | Freq: Two times a day (BID) | ORAL | Status: DC | PRN

## 2015-07-07 NOTE — Telephone Encounter (Signed)
Returned pt Laura Huerta. EEG was study was normal in Dec. The patient continues to fall. So, Dr. Jannifer Franklin may consider doing EMG and nerve conduction studies. She is also having "leg pain and sharp pains in her head." F/u appt scheduled next week to discuss further w/ MD. Pt verbalized understanding and appreciation for call.

## 2015-07-07 NOTE — Telephone Encounter (Signed)
Patient is requesting medication refill for polyethylene glycol powder (GLYCOLAX/MIRALAX) powder [id number] .Marland KitchenMarland KitchenMarland KitchenMarland Kitchenplease follow up

## 2015-07-07 NOTE — Telephone Encounter (Signed)
Medication refilled, patient needs appt for future refills

## 2015-07-07 NOTE — Telephone Encounter (Signed)
Patient is calling. She had an EEG on 01-12-15 in our office. She said she thought a NCV/EMG was the next step but never was done. Should this test be scheduled? Please call and advise.

## 2015-07-12 ENCOUNTER — Ambulatory Visit (INDEPENDENT_AMBULATORY_CARE_PROVIDER_SITE_OTHER): Payer: Medicaid Other | Admitting: Neurology

## 2015-07-12 ENCOUNTER — Encounter: Payer: Self-pay | Admitting: Neurology

## 2015-07-12 VITALS — BP 118/70 | HR 58 | Ht 63.0 in | Wt 160.0 lb

## 2015-07-12 DIAGNOSIS — R269 Unspecified abnormalities of gait and mobility: Secondary | ICD-10-CM | POA: Diagnosis not present

## 2015-07-12 DIAGNOSIS — R55 Syncope and collapse: Secondary | ICD-10-CM | POA: Diagnosis not present

## 2015-07-12 NOTE — Patient Instructions (Signed)
Fall Prevention in the Home  Falls can cause injuries and can affect people from all age groups. There are many simple things that you can do to make your home safe and to help prevent falls. WHAT CAN I DO ON THE OUTSIDE OF MY HOME?  Regularly repair the edges of walkways and driveways and fix any cracks.  Remove high doorway thresholds.  Trim any shrubbery on the main path into your home.  Use bright outdoor lighting.  Clear walkways of debris and clutter, including tools and rocks.  Regularly check that handrails are securely fastened and in good repair. Both sides of any steps should have handrails.  Install guardrails along the edges of any raised decks or porches.  Have leaves, snow, and ice cleared regularly.  Use sand or salt on walkways during winter months.  In the garage, clean up any spills right away, including grease or oil spills. WHAT CAN I DO IN THE BATHROOM?  Use night lights.  Install grab bars by the toilet and in the tub and shower. Do not use towel bars as grab bars.  Use non-skid mats or decals on the floor of the tub or shower.  If you need to sit down while you are in the shower, use a plastic, non-slip stool..  Keep the floor dry. Immediately clean up any water that spills on the floor.  Remove soap buildup in the tub or shower on a regular basis.  Attach bath mats securely with double-sided non-slip rug tape.  Remove throw rugs and other tripping hazards from the floor. WHAT CAN I DO IN THE BEDROOM?  Use night lights.  Make sure that a bedside light is easy to reach.  Do not use oversized bedding that drapes onto the floor.  Have a firm chair that has side arms to use for getting dressed.  Remove throw rugs and other tripping hazards from the floor. WHAT CAN I DO IN THE KITCHEN?   Clean up any spills right away.  Avoid walking on wet floors.  Place frequently used items in easy-to-reach places.  If you need to reach for something  above you, use a sturdy step stool that has a grab bar.  Keep electrical cables out of the way.  Do not use floor polish or wax that makes floors slippery. If you have to use wax, make sure that it is non-skid floor wax.  Remove throw rugs and other tripping hazards from the floor. WHAT CAN I DO IN THE STAIRWAYS?  Do not leave any items on the stairs.  Make sure that there are handrails on both sides of the stairs. Fix handrails that are broken or loose. Make sure that handrails are as long as the stairways.  Check any carpeting to make sure that it is firmly attached to the stairs. Fix any carpet that is loose or worn.  Avoid having throw rugs at the top or bottom of stairways, or secure the rugs with carpet tape to prevent them from moving.  Make sure that you have a light switch at the top of the stairs and the bottom of the stairs. If you do not have them, have them installed. WHAT ARE SOME OTHER FALL PREVENTION TIPS?  Wear closed-toe shoes that fit well and support your feet. Wear shoes that have rubber soles or low heels.  When you use a stepladder, make sure that it is completely opened and that the sides are firmly locked. Have someone hold the ladder while you   are using it. Do not climb a closed stepladder.  Add color or contrast paint or tape to grab bars and handrails in your home. Place contrasting color strips on the first and last steps.  Use mobility aids as needed, such as canes, walkers, scooters, and crutches.  Turn on lights if it is dark. Replace any light bulbs that burn out.  Set up furniture so that there are clear paths. Keep the furniture in the same spot.  Fix any uneven floor surfaces.  Choose a carpet design that does not hide the edge of steps of a stairway.  Be aware of any and all pets.  Review your medicines with your healthcare provider. Some medicines can cause dizziness or changes in blood pressure, which increase your risk of falling. Talk  with your health care provider about other ways that you can decrease your risk of falls. This may include working with a physical therapist or trainer to improve your strength, balance, and endurance.   This information is not intended to replace advice given to you by your health care provider. Make sure you discuss any questions you have with your health care provider.   Document Released: 01/13/2002 Document Revised: 06/09/2014 Document Reviewed: 02/27/2014 Elsevier Interactive Patient Education 2016 Elsevier Inc.  

## 2015-07-12 NOTE — Progress Notes (Signed)
Reason for visit: Falling  Laura Huerta is an 50 y.o. female  History of present illness:  Laura Huerta is a 50 year old right-handed black female with a history of episodic falling. The patient was last seen in October 2016 with multiple episodes of collapse. The patient indicated that the left leg primarily would give out from under her, and she may go down. The patient was having 5 or 6 such episodes a day at that time. The patient underwent MRI of the brain that showed very minimal white matter changes, blood work was unremarkable. Clinical examination at that time showed normal ability to ambulate. The patient did well for several months, but 10 days ago she began having further events of falling, with collapse of both legs. The patient last fell 2 days ago. The patient indicates that she will be walking and have collapse of both legs without warning, unassociated with dizziness, or syncope. She does not lose consciousness. The sleep, EEG evaluation was normal. The patient denies any palpitations of the heart, or any chest pain. The patient does have headaches off and on, she has had some headaches recently. She returns this office for an evaluation. She denies any numbness or weakness of the extremities.  Past Medical History  Diagnosis Date  . Acid reflux   . Bronchitis   . Hypertension   . Depression   . Anxiety   . Hyperlipidemia   . Abnormality of gait 11/18/2014    Past Surgical History  Procedure Laterality Date  . Cesarean section    . Tubal ligation    . Hemorrhoid banding  2015    Family History  Problem Relation Age of Onset  . Hypertension Mother   . Heart failure Mother     Died age 18  . COPD Mother   . Asthma Mother   . Diabetes Mother   . Depression Mother   . Hypertension Father   . Diabetes Father   . Breast cancer Maternal Aunt     Social history:  reports that she has been smoking Cigarettes.  She has a 10 pack-year smoking history. She  has never used smokeless tobacco. She reports that she drinks alcohol. She reports that she does not use illicit drugs.   No Known Allergies  Medications:  Prior to Admission medications   Medication Sig Start Date End Date Taking? Authorizing Provider  amLODipine-benazepril (LOTREL) 10-20 MG capsule Take 1 capsule by mouth daily. 03/30/15  Yes Lance Bosch, NP  atorvastatin (LIPITOR) 10 MG tablet Take 1 tablet (10 mg total) by mouth daily. 03/30/15  Yes Lance Bosch, NP  buPROPion (WELLBUTRIN) 75 MG tablet Take 75 mg by mouth 2 (two) times daily. Reported on 03/12/2015   Yes Historical Provider, MD  omeprazole (PRILOSEC) 20 MG capsule Take 1 capsule (20 mg total) by mouth daily. 03/30/15  Yes Lance Bosch, NP  polyethylene glycol powder (GLYCOLAX/MIRALAX) powder Take 17 g by mouth 2 (two) times daily as needed. Needs office visit 07/07/15  Yes Tresa Garter, MD  zolpidem (AMBIEN) 10 MG tablet Take 10 mg by mouth at bedtime as needed for sleep.   Yes Historical Provider, MD    ROS:  Out of a complete 14 system review of symptoms, the patient complains only of the following symptoms, and all other reviewed systems are negative.  Joint pain Leg weakness  Blood pressure 118/70, pulse 58, height 5\' 3"  (1.6 m), weight 160 lb (72.576 kg), last menstrual period 04/09/2011.  Blood pressure, right arm, sitting is 108/76. Blood pressure, right arm, standing is 123456 systolic.  Physical Exam  General: The patient is alert and cooperative at the time of the examination.  Skin: No significant peripheral edema is noted.   Neurologic Exam  Mental status: The patient is alert and oriented x 3 at the time of the examination. The patient has apparent normal recent and remote memory, with an apparently normal attention span and concentration ability.   Cranial nerves: Facial symmetry is present. Speech is normal, no aphasia or dysarthria is noted. Extraocular movements are full. Visual fields  are full.  Motor: The patient has good strength in all 4 extremities.  Sensory examination: Soft touch sensation is symmetric on the face, arms, and legs. No evidence of a stocking pattern pinprick sensory deficit is noted in the legs.  Coordination: The patient has good finger-nose-finger and heel-to-shin bilaterally.  Gait and station: The patient has a normal gait. Tandem gait is normal. Romberg is negative. No drift is seen.  Reflexes: Deep tendon reflexes are symmetric.   MRI brain 12/01/14:  IMPRESSION: 1. No acute intracranial abnormality or mass. 2. 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 Considerations include chronic small vessel ischemia, sequelae of trauma, hypercoagulable state, vasculitis, migraines, prior infection or less likely demyelination.  * MRI scan images were reviewed online. I agree with the written report.    Assessment/Plan:  1. Episodic collapse, fall  The patient is having intermittent episodes of sudden collapse of both legs, with subsequent falling. The etiology of this is not clear. The patient indicates that she has no palpitations of the heart, she does not lose consciousness. We will check a carotid Doppler study, and if this is unremarkable we may consider a cardiac monitoring study. The patient does not trip on her feet, her legs actually give way. She will follow-up in 4-5 months.  Jill Alexanders MD 07/12/2015 8:38 PM  [facility] [address] [place], [place] [address]  Phone [phone] Fax [phone]

## 2015-08-02 ENCOUNTER — Telehealth: Payer: Self-pay | Admitting: Neurology

## 2015-08-02 NOTE — Telephone Encounter (Signed)
Faxed records to [facility] for clinic review Case number [id number] US Carotid Duplex. Telephone [phone] fax [phone].

## 2015-08-04 NOTE — Telephone Encounter (Signed)
Case [id number] Approval [id number] 08/02/2015-09/01/2015. Doppler

## 2015-08-30 ENCOUNTER — Telehealth: Payer: Self-pay | Admitting: Neurology

## 2015-08-30 NOTE — Telephone Encounter (Signed)
Opened in error

## 2015-08-30 NOTE — Telephone Encounter (Signed)
Can you let me no when you schedule her so I can let insurance no. Thanks I will have to get a new PA.

## 2015-08-31 NOTE — Telephone Encounter (Signed)
Pt needs re-auth.  Re-sending per your request. :-)

## 2015-09-01 NOTE — Telephone Encounter (Signed)
[place] and Patient is ready to be scheduled for her Doppler new PA was done with [employer] [phone].   New Case # [id number] - approval [id number] 09/01/2015-10/01/2015. Thanks [employer]

## 2015-09-07 ENCOUNTER — Other Ambulatory Visit: Payer: Self-pay | Admitting: Internal Medicine

## 2015-09-08 ENCOUNTER — Ambulatory Visit (INDEPENDENT_AMBULATORY_CARE_PROVIDER_SITE_OTHER): Payer: Medicaid Other

## 2015-09-08 DIAGNOSIS — R55 Syncope and collapse: Secondary | ICD-10-CM | POA: Diagnosis not present

## 2015-09-08 DIAGNOSIS — R269 Unspecified abnormalities of gait and mobility: Secondary | ICD-10-CM

## 2015-09-23 ENCOUNTER — Telehealth: Payer: Self-pay | Admitting: Neurology

## 2015-09-23 DIAGNOSIS — R55 Syncope and collapse: Secondary | ICD-10-CM

## 2015-09-23 NOTE — Telephone Encounter (Signed)
I called patient. The carotid Doppler study is unremarkable, the patient indicates that she still having falls. I will set up a prolonged cardiac monitor to exclude a heart rhythm issue initiating the collapse episodes.

## 2015-10-13 ENCOUNTER — Ambulatory Visit (INDEPENDENT_AMBULATORY_CARE_PROVIDER_SITE_OTHER): Payer: Medicaid Other

## 2015-10-13 DIAGNOSIS — R55 Syncope and collapse: Secondary | ICD-10-CM

## 2015-11-09 ENCOUNTER — Other Ambulatory Visit: Payer: Self-pay | Admitting: Internal Medicine

## 2015-11-12 ENCOUNTER — Ambulatory Visit (INDEPENDENT_AMBULATORY_CARE_PROVIDER_SITE_OTHER): Payer: Medicaid Other | Admitting: Neurology

## 2015-11-12 ENCOUNTER — Encounter: Payer: Self-pay | Admitting: Neurology

## 2015-11-12 VITALS — BP 135/94 | HR 80 | Ht 63.0 in | Wt 157.0 lb

## 2015-11-12 DIAGNOSIS — R269 Unspecified abnormalities of gait and mobility: Secondary | ICD-10-CM

## 2015-11-12 NOTE — Progress Notes (Signed)
Reason for visit: Falls  Laura Huerta is an 50 y.o. female  History of present illness:  Ms. Laura Huerta is a 50 year old right-handed black female with a history of episodic falls. The etiology of the falls is not clear. The patient will walk normally, then suddenly her knees will collapse and she will fall down. She does not black out during the events. She is now sensing some mild dizziness right before the episodes occur. The patient has undergone MRI of the brain that does show some small vessel disease affecting the brainstem, EEG study was unremarkable, carotid Doppler study was normal. The patient is now wearing a prolonged cardiac monitor. She is having about 2 episodes of falling per week.   Past Medical History:  Diagnosis Date  . Abnormality of gait 11/18/2014  . Acid reflux   . Anxiety   . Bronchitis   . Depression   . Hyperlipidemia   . Hypertension     Past Surgical History:  Procedure Laterality Date  . CESAREAN SECTION    . HEMORRHOID BANDING  2015  . TUBAL LIGATION      Family History  Problem Relation Age of Onset  . Hypertension Mother   . Heart failure Mother     Died age 62  . COPD Mother   . Asthma Mother   . Diabetes Mother   . Depression Mother   . Hypertension Father   . Diabetes Father   . Breast cancer Maternal Aunt     Social history:  reports that she has been smoking Cigarettes.  She has a 10.00 pack-year smoking history. She has never used smokeless tobacco. She reports that she drinks alcohol. She reports that she does not use drugs.   No Known Allergies  Medications:  Prior to Admission medications   Medication Sig Start Date End Date Taking? Authorizing Provider  amLODipine-benazepril (LOTREL) 10-20 MG capsule Take 1 capsule by mouth daily. 03/30/15  Yes Lance Bosch, NP  atorvastatin (LIPITOR) 10 MG tablet Take 1 tablet (10 mg total) by mouth daily. 03/30/15  Yes Lance Bosch, NP  buPROPion (WELLBUTRIN) 75 MG tablet Take  75 mg by mouth 2 (two) times daily. Reported on 03/12/2015   Yes Historical Provider, MD  omeprazole (PRILOSEC) 40 MG capsule take 1 capsule by mouth once daily before meals 08/24/15  Yes Historical Provider, MD  polyethylene glycol powder (GLYCOLAX/MIRALAX) powder take 17GM (DISSOLVED IN WATER) by mouth twice a day if needed 11/09/15  Yes Olugbemiga E Doreene Burke, MD  zolpidem (AMBIEN) 10 MG tablet Take 10 mg by mouth at bedtime as needed for sleep.   Yes Historical Provider, MD    ROS:  Out of a complete 14 system review of symptoms, the patient complains only of the following symptoms, and all other reviewed systems are negative.  Blurred vision Wheezing, shortness of breath, chest tightness Insomnia Speech difficulty  Blood pressure (!) 135/94, pulse 80, height 5\' 3"  (1.6 m), weight 157 lb (71.2 kg), last menstrual period 04/09/2011.  Physical Exam  General: The patient is alert and cooperative at the time of the examination.  Skin: No significant peripheral edema is noted.   Neurologic Exam  Mental status: The patient is alert and oriented x 3 at the time of the examination. The patient has apparent normal recent and remote memory, with an apparently normal attention span and concentration ability.   Cranial nerves: Facial symmetry is present. Speech is normal, no aphasia or dysarthria is noted. Extraocular movements  are full. Visual fields are full.  Motor: The patient has good strength in all 4 extremities.  Sensory examination: Soft touch sensation is symmetric on the face, arms, and legs.  Coordination: The patient has good finger-nose-finger and heel-to-shin bilaterally.  Gait and station: The patient has a normal gait. Tandem gait is normal. Romberg is negative. No drift is seen. The patient did have a fall during the examination, both knees collapsed, no loss of consciousness, the patient fell backwards.  Reflexes: Deep tendon reflexes are  symmetric.   Assessment/Plan:  1. Episodic falling  The etiology of the falling is not clear. The patient is having some events of collapse at the knees. She may feel some dizziness prior to the fall. She denies any visual changes. The cardiac monitor study will be completed soon, if this is unremarkable, I may give an empiric trial on Keppra. The patient will follow-up in 4 or 5 months. It is possible that the falls may be psychogenic.   Jill Alexanders MD 11/12/2015 11:18 AM  [facility] [address] [place], [place] [address]  Phone [phone] Fax [phone]

## 2015-12-17 ENCOUNTER — Telehealth: Payer: Self-pay | Admitting: Internal Medicine

## 2015-12-17 NOTE — Telephone Encounter (Signed)
Pt. Came into facility requesting a refill on buPROPion (WELLBUTRIN) 75 MG tablet  Pt. Would like the Rx sent to [facility].Please f/u.

## 2015-12-20 MED ORDER — BUPROPION HCL 75 MG PO TABS: 75.0000 mg | ORAL_TABLET | Freq: Two times a day (BID) | ORAL | 0 refills | Status: DC

## 2015-12-20 NOTE — Telephone Encounter (Signed)
Bupropion refilled - patient needs office visit for refills.

## 2015-12-23 ENCOUNTER — Other Ambulatory Visit: Payer: Self-pay | Admitting: Internal Medicine

## 2015-12-23 DIAGNOSIS — I10 Essential (primary) hypertension: Secondary | ICD-10-CM

## 2015-12-23 NOTE — Telephone Encounter (Signed)
Pt. Called requesting a refill no her BP medication Please f/u.

## 2015-12-27 ENCOUNTER — Telehealth: Payer: Self-pay | Admitting: Neurology

## 2015-12-27 NOTE — Telephone Encounter (Signed)
I called patient. Cardiac monitor study does not show any etiology for her drop attacks. May give an empiric trial on Keppra she is amenable to it, she is to contact our office.   Cardiac event monitor 12/27/15:  Predominant rhythm is sinus rhythm Rare PVCs. I do not see any clear episodes of afib, though baseline artifact limits interpretation at times. A long RP tachycardia is noted and may represent sinus tachycardia but occurred 11/04/15 at 1:29 am.  Clinically correlation is advised.  No other sustained arrhythmias are noted

## 2016-01-03 ENCOUNTER — Ambulatory Visit: Payer: Medicaid Other | Attending: Family Medicine | Admitting: Family Medicine

## 2016-01-03 ENCOUNTER — Encounter: Payer: Self-pay | Admitting: Family Medicine

## 2016-01-03 VITALS — BP 108/71 | HR 76 | Temp 97.8°F | Ht 63.0 in | Wt 156.2 lb

## 2016-01-03 DIAGNOSIS — Z0000 Encounter for general adult medical examination without abnormal findings: Secondary | ICD-10-CM

## 2016-01-03 DIAGNOSIS — Z79899 Other long term (current) drug therapy: Secondary | ICD-10-CM | POA: Diagnosis not present

## 2016-01-03 DIAGNOSIS — I10 Essential (primary) hypertension: Secondary | ICD-10-CM | POA: Insufficient documentation

## 2016-01-03 DIAGNOSIS — Z0001 Encounter for general adult medical examination with abnormal findings: Secondary | ICD-10-CM | POA: Insufficient documentation

## 2016-01-03 DIAGNOSIS — G4700 Insomnia, unspecified: Secondary | ICD-10-CM | POA: Diagnosis not present

## 2016-01-03 DIAGNOSIS — R1030 Lower abdominal pain, unspecified: Secondary | ICD-10-CM | POA: Insufficient documentation

## 2016-01-03 DIAGNOSIS — F17200 Nicotine dependence, unspecified, uncomplicated: Secondary | ICD-10-CM

## 2016-01-03 DIAGNOSIS — E785 Hyperlipidemia, unspecified: Secondary | ICD-10-CM | POA: Diagnosis not present

## 2016-01-03 DIAGNOSIS — K219 Gastro-esophageal reflux disease without esophagitis: Secondary | ICD-10-CM | POA: Insufficient documentation

## 2016-01-03 LAB — CBC WITH DIFFERENTIAL/PLATELET
BASOS ABS: 0 {cells}/uL (ref 0–200)
Basophils Relative: 0 %
EOS ABS: 148 {cells}/uL (ref 15–500)
EOS PCT: 2 %
HCT: 44.2 % (ref 35.0–45.0)
Hemoglobin: 13.6 g/dL (ref 11.7–15.5)
LYMPHS PCT: 33 %
Lymphs Abs: 2442 cells/uL (ref 850–3900)
MCH: 23.5 pg — AB (ref 27.0–33.0)
MCHC: 30.8 g/dL — ABNORMAL LOW (ref 32.0–36.0)
MCV: 76.3 fL — AB (ref 80.0–100.0)
MONOS PCT: 5 %
MPV: 10.7 fL (ref 7.5–12.5)
Monocytes Absolute: 370 cells/uL (ref 200–950)
Neutro Abs: 4440 cells/uL (ref 1500–7800)
Neutrophils Relative %: 60 %
PLATELETS: 326 10*3/uL (ref 140–400)
RBC: 5.79 MIL/uL — ABNORMAL HIGH (ref 3.80–5.10)
RDW: 16.4 % — AB (ref 11.0–15.0)
WBC: 7.4 10*3/uL (ref 3.8–10.8)

## 2016-01-03 LAB — LIPID PANEL
CHOLESTEROL: 124 mg/dL (ref <200)
HDL: 60 mg/dL (ref >50)
LDL Cholesterol: 55 mg/dL (ref <100)
Total CHOL/HDL Ratio: 2.1 Ratio (ref <5.0)
Triglycerides: 44 mg/dL (ref <150)
VLDL: 9 mg/dL (ref <30)

## 2016-01-03 LAB — BASIC METABOLIC PANEL
BUN: 6 mg/dL — AB (ref 7–25)
CALCIUM: 9.4 mg/dL (ref 8.6–10.4)
CHLORIDE: 106 mmol/L (ref 98–110)
CO2: 28 mmol/L (ref 20–31)
CREATININE: 0.84 mg/dL (ref 0.50–1.05)
Glucose, Bld: 101 mg/dL — ABNORMAL HIGH (ref 65–99)
Potassium: 3.6 mmol/L (ref 3.5–5.3)
Sodium: 143 mmol/L (ref 135–146)

## 2016-01-03 MED ORDER — BUPROPION HCL 75 MG PO TABS: 75.0000 mg | ORAL_TABLET | Freq: Two times a day (BID) | ORAL | 0 refills | Status: DC

## 2016-01-03 MED ORDER — OMEPRAZOLE 40 MG PO CPDR: 40.0000 mg | DELAYED_RELEASE_CAPSULE | Freq: Two times a day (BID) | ORAL | 1 refills | Status: DC

## 2016-01-03 MED ORDER — POLYETHYLENE GLYCOL 3350 17 GM/SCOOP PO POWD: 17.0000 g | Freq: Two times a day (BID) | ORAL | 0 refills | Status: DC | PRN

## 2016-01-03 MED ORDER — ATORVASTATIN CALCIUM 10 MG PO TABS: 10.0000 mg | ORAL_TABLET | Freq: Every day | ORAL | 3 refills | Status: DC

## 2016-01-03 MED ORDER — LISINOPRIL 10 MG PO TABS: 10.0000 mg | ORAL_TABLET | Freq: Every day | ORAL | 0 refills | Status: DC

## 2016-01-03 MED ORDER — TRAZODONE HCL 50 MG PO TABS: 50.0000 mg | ORAL_TABLET | Freq: Every evening | ORAL | 3 refills | Status: DC | PRN

## 2016-01-03 NOTE — Progress Notes (Signed)
Patient here for f/u HTN.

## 2016-01-03 NOTE — Patient Instructions (Addendum)
Food Choices for Gastroesophageal Reflux Disease, Adult When you have gastroesophageal reflux disease (GERD), the foods you eat and your eating habits are very important. Choosing the right foods can help ease your discomfort. What guidelines do I need to follow?  Choose fruits, vegetables, whole grains, and low-fat dairy products.  Choose low-fat meat, fish, and poultry.  Limit fats such as oils, salad dressings, butter, nuts, and avocado.  Keep a food diary. This helps you identify foods that cause symptoms.  Avoid foods that cause symptoms. These may be different for everyone.  Eat small meals often instead of 3 large meals a day.  Eat your meals slowly, in a place where you are relaxed.  Limit fried foods.  Cook foods using methods other than frying.  Avoid drinking alcohol.  Avoid drinking large amounts of liquids with your meals.  Avoid bending over or lying down until 2-3 hours after eating. What foods are not recommended? These are some foods and drinks that may make your symptoms worse: Vegetables  Tomatoes. Tomato juice. Tomato and spaghetti sauce. Chili peppers. Onion and garlic. Horseradish. Fruits  Oranges, grapefruit, and lemon (fruit and juice). Meats  High-fat meats, fish, and poultry. This includes hot dogs, ribs, ham, sausage, salami, and bacon. Dairy  Whole milk and chocolate milk. Sour cream. Cream. Butter. Ice cream. Cream cheese. Drinks  Coffee and tea. Bubbly (carbonated) drinks or energy drinks. Condiments  Hot sauce. Barbecue sauce. Sweets/Desserts  Chocolate and cocoa. Donuts. Peppermint and spearmint. Fats and Oils  High-fat foods. This includes [place] fries and potato chips. Other  Vinegar. Strong spices. This includes black pepper, white pepper, red pepper, cayenne, curry powder, cloves, ginger, and chili powder. The items listed above may not be a complete list of foods and drinks to avoid. Contact your dietitian for more information.    This information is not intended to replace advice given to you by your health care provider. Make sure you discuss any questions you have with your health care provider. Document Released: 07/25/2011 Document Revised: 07/01/2015 Document Reviewed: 11/27/2012 Elsevier Interactive Patient Education  2017 [employer]. Steps to Quit Smoking Smoking tobacco can be bad for your health. It can also affect almost every organ in your body. Smoking puts you and people around you at risk for many serious long-lasting (chronic) diseases. Quitting smoking is hard, but it is one of the best things that you can do for your health. It is never too late to quit. What are the benefits of quitting smoking? When you quit smoking, you lower your risk for getting serious diseases and conditions. They can include:  Lung cancer or lung disease.  Heart disease.  Stroke.  Heart attack.  Not being able to have children (infertility).  Weak bones (osteoporosis) and broken bones (fractures). If you have coughing, wheezing, and shortness of breath, those symptoms may get better when you quit. You may also get sick less often. If you are pregnant, quitting smoking can help to lower your chances of having a baby of low birth weight. What can I do to help me quit smoking? Talk with your doctor about what can help you quit smoking. Some things you can do (strategies) include:  Quitting smoking totally, instead of slowly cutting back how much you smoke over a period of time.  Going to in-person counseling. You are more likely to quit if you go to many counseling sessions.  Using resources and support systems, such as:  Online chats with a Social worker.  Phone quitlines.  Printed Furniture conservator/restorer.  Support groups or group counseling.  Text messaging programs.  Mobile phone apps or applications.  Taking medicines. Some of these medicines may have nicotine in them. If you are pregnant or breastfeeding, do  not take any medicines to quit smoking unless your doctor says it is okay. Talk with your doctor about counseling or other things that can help you. Talk with your doctor about using more than one strategy at the same time, such as taking medicines while you are also going to in-person counseling. This can help make quitting easier. What things can I do to make it easier to quit? Quitting smoking might feel very hard at first, but there is a lot that you can do to make it easier. Take these steps:  Talk to your family and friends. Ask them to support and encourage you.  Call phone quitlines, reach out to support groups, or work with a Social worker.  Ask people who smoke to not smoke around you.  Avoid places that make you want (trigger) to smoke, such as:  Bars.  Parties.  Smoke-break areas at work.  Spend time with people who do not smoke.  Lower the stress in your life. Stress can make you want to smoke. Try these things to help your stress:  Getting regular exercise.  Deep-breathing exercises.  Yoga.  Meditating.  Doing a body scan. To do this, close your eyes, focus on one area of your body at a time from head to toe, and notice which parts of your body are tense. Try to relax the muscles in those areas.  Download or buy apps on your mobile phone or tablet that can help you stick to your quit plan. There are many free apps, such as QuitGuide from the [employer] Office manager for Disease Control and Prevention). You can find more support from smokefree.gov and other websites. This information is not intended to replace advice given to you by your health care provider. Make sure you discuss any questions you have with your health care provider. Document Released: 11/19/2008 Document Revised: 09/21/2015 Document Reviewed: 06/09/2014 Elsevier Interactive Patient Education  2017 Else[employer] Inc. Dyslipidemia Dyslipidemia is an imbalance of waxy, fat-like substances (lipids) in the blood. The body  needs lipids in small amounts. Dyslipidemia often involves a high level of cholesterol or triglycerides, which are types of lipids. Common forms of dyslipidemia include:  High levels of bad cholesterol (LDL cholesterol). LDL is the type of cholesterol that causes fatty deposits (plaques) to build up in the blood vessels that carry blood away from your heart (arteries).  Low levels of good cholesterol (HDL cholesterol). HDL cholesterol is the type of cholesterol that protects against heart disease. High levels of HDL remove the LDL buildup from arteries.  High levels of triglycerides. Triglycerides are a fatty substance in the blood that is linked to a buildup of plaques in the arteries. You can develop dyslipidemia because of the genes you are born with (primary dyslipidemia) or changes that occur during your life (secondary dyslipidemia), or as a side effect of certain medical treatments. What are the causes? Primary dyslipidemia is caused by changes (mutations) in genes that are passed down through families (inherited). These mutations cause several types of dyslipidemia. Mutations can result in disorders that make the body produce too much LDL cholesterol or triglycerides, or not enough HDL cholesterol. These disorders may lead to heart disease, arterial disease, or stroke at an early age. Causes of secondary dyslipidemia include  certain lifestyle choices and diseases that lead to dyslipidemia, such as:  Eating a diet that is high in animal fat.  Not getting enough activity or exercise (having a sedentary lifestyle).  Having diabetes, kidney disease, liver disease, or thyroid disease.  Drinking large amounts of alcohol.  Using certain types of drugs. What increases the risk? You may be at greater risk for dyslipidemia if you are an older man or if you are a woman who has gone through menopause. Other risk factors include:  Having a family history of dyslipidemia.  Taking certain  medicines, including birth control pills, steroids, some diuretics, beta-blockers, and some medicines forHIV.  Smoking cigarettes.  Eating a high-fat diet.  Drinking large amounts of alcohol.  Having certain medical conditions such as diabetes, polycystic ovary syndrome (PCOS), pregnancy, kidney disease, liver disease, or hypothyroidism.  Not exercising regularly.  Being overweight or obese with too much belly fat. What are the signs or symptoms? Dyslipidemia does not usually cause any symptoms. Very high lipid levels can cause fatty bumps under the skin (xanthomas) or a white or gray ring around the black center (pupil) of the eye. Very high triglyceride levels can cause inflammation of the pancreas (pancreatitis). How is this diagnosed? Your health care provider may diagnose dyslipidemia based on a routine blood test (fasting blood test). Because most people do not have symptoms of the condition, this blood testing (lipid profile) is done on adults age 41 and older and is repeated every 5 years. This test checks:  Total cholesterol. This is a measure of the total amount of cholesterol in your blood, including LDL cholesterol, HDL cholesterol, and triglycerides. A healthy number is below 200.  LDL cholesterol. The target number for LDL cholesterol is different for each person, depending on individual risk factors. For most people, a number below 100 is healthy. Ask your health care provider what your LDL cholesterol number should be.  HDL cholesterol. An HDL level of 60 or higher is best because it helps to protect against heart disease. A number below 4 for men or below 110 for women increases the risk for heart disease.  Triglycerides. A healthy triglyceride number is below 150. If your lipid profile is abnormal, your health care provider may do other blood tests to get more information about your condition. How is this treated? Treatment depends on the type of dyslipidemia that you  have and your other risk factors for heart disease and stroke. Your health care provider will have a target range for your lipid levels based on this information. For many people, treatment starts with lifestyle changes, such as diet and exercise. Your health care provider may recommend that you:  Get regular exercise.  Make changes to your diet.  Quit smoking if you smoke. If diet changes and exercise do not help you reach your goals, your health care provider may also prescribe medicine to lower lipids. The most commonly prescribed type of medicine lowers your LDL cholesterol (statin drug). If you have a high triglyceride level, your provider may prescribe another type of drug (fibrate) or an omega-3 fish oil supplement, or both. Follow these instructions at home:  Take over-the-counter and prescription medicines only as told by your health care provider. This includes supplements.  Get regular exercise. Start an aerobic exercise and strength training program as told by your health care provider. Ask your health care provider what activities are safe for you. Your health care provider may recommend:  30 minutes of aerobic activity  4-6 days a week. Brisk walking is an example of aerobic activity.  Strength training 2 days a week.  Eat a healthy diet as told by your health care provider. This can help you reach and maintain a healthy weight, lower your LDL cholesterol, and raise your HDL cholesterol. It may help to work with a diet and nutrition specialist (dietitian) to make a plan that is right for you. Your dietitian or health care provider may recommend:  Limiting your calories, if you are overweight.  Eating more fruits, vegetables, whole grains, fish, and lean meats.  Limiting saturated fat, trans fat, and cholesterol.  Follow instructions from your health care provider or dietitian about eating or drinking restrictions.  Limit alcohol intake to no more than one drink per day for  nonpregnant women and two drinks per day for men. One drink equals 12 oz of beer, 5 oz of wine, or 1 oz of hard liquor.  Do not use any products that contain nicotine or tobacco, such as cigarettes and e-cigarettes. If you need help quitting, ask your health care provider.  Keep all follow-up visits as told by your health care provider. This is important. Contact a health care provider if:  You are having trouble sticking to your exercise or diet plan.  You are struggling to quit smoking or control your use of alcohol. Summary  Dyslipidemia is an imbalance of waxy, fat-like substances (lipids) in the blood. The body needs lipids in small amounts. Dyslipidemia often involves a high level of cholesterol or triglycerides, which are types of lipids.  Treatment depends on the type of dyslipidemia that you have and your other risk factors for heart disease and stroke.  For many people, treatment starts with lifestyle changes, such as diet and exercise. Your health care provider may also prescribe medicine to lower lipids. This information is not intended to replace advice given to you by your health care provider. Make sure you discuss any questions you have with your health care provider. Document Released: 01/28/2013 Document Revised: 09/20/2015 Document Reviewed: 09/20/2015 Elsevier Interactive Patient Education  2017 [place]. Hypertension Hypertension is another name for high blood pressure. High blood pressure forces your heart to work harder to pump blood. A blood pressure reading has two numbers, which includes a higher number over a lower number (example: 110/72). Follow these instructions at home:  Have your blood pressure rechecked by your doctor.  Only take medicine as told by your doctor. Follow the directions carefully. The medicine does not work as well if you skip doses. Skipping doses also puts you at risk for problems.  Do not smoke.  Monitor your blood pressure at  home as told by your doctor. Contact a doctor if:  You think you are having a reaction to the medicine you are taking.  You have repeat headaches or feel dizzy.  You have puffiness (swelling) in your ankles.  You have trouble with your vision. Get help right away if:  You get a very bad headache and are confused.  You feel weak, numb, or faint.  You get chest or belly (abdominal) pain.  You throw up (vomit).  You cannot breathe very well. This information is not intended to replace advice given to you by your health care provider. Make sure you discuss any questions you have with your health care provider. Document Released: 07/12/2007 Document Revised: 07/01/2015 Document Reviewed: 11/15/2012 Elsevier Interactive Patient Education  2017 [employer].

## 2016-01-03 NOTE — Progress Notes (Signed)
Pt is in the office today for refills on her medications Pt wants info on quitting smoking she is back up to 2 packs/day

## 2016-01-03 NOTE — Progress Notes (Signed)
Subjective:  Patient ID: Laura Huerta, female    DOB: 01/12/1966  Age: 50 y.o. MRN: [id number]  CC: Refills   HPI Laura Huerta presents for complaints of stomach pain and is also requesting medication refills. Onset of pain began last week. Pain is located in the LLQ and hypogastric regions. She describes the pain as intermitment lasting for 3 to 4 days at a time. Rates pain 10/10 and is only relieved by resting. She reports taking over the counter gas medication and Pepcid with minimal relief. She reports belching. Denies any change in bowel habits or difficulty with urination. Abdominal pain is not worsened or lessened with food. She reports needing medication refills. Patient is a current smoker who  increased her smoking from 1 pack per day to 2 packs per day within the last 2 months. She expresses a desire to quit however she declines nicotine patches, gum, and other additional pharmaceutical interventions for smoking cessation at this visit.  Outpatient Medications Prior to Visit  Medication Sig Dispense Refill  . amLODipine-benazepril (LOTREL) 10-20 MG capsule take 1 capsule by mouth once daily 30 capsule 0  . atorvastatin (LIPITOR) 10 MG tablet Take 1 tablet (10 mg total) by mouth daily. 90 tablet 3  . buPROPion (WELLBUTRIN) 75 MG tablet Take 1 tablet (75 mg total) by mouth 2 (two) times daily. Reported on 03/12/2015 60 tablet 0  . omeprazole (PRILOSEC) 40 MG capsule Take 40 mg by mouth 2 (two) times daily.  0  . polyethylene glycol powder (GLYCOLAX/MIRALAX) powder take 17GM (DISSOLVED IN WATER) by mouth twice a day if needed 255 g 0  . zolpidem (AMBIEN) 10 MG tablet Take 10 mg by mouth at bedtime as needed for sleep.     No facility-administered medications prior to visit.     ROS Review of Systems  HENT: Negative for trouble swallowing.   Respiratory: Negative.   Cardiovascular: Negative.   Gastrointestinal: Positive for abdominal pain. Negative for constipation,  nausea and vomiting.  Genitourinary: Negative for difficulty urinating and dysuria.    Objective:  BP 108/71 (BP Location: Left Arm, Patient Position: Sitting, Cuff Size: Small)   Pulse 76   Temp 97.8 F (36.6 C) (Oral)   Ht 5\' 3"  (1.6 m)   Wt 156 lb 3.2 oz (70.9 kg)   LMP 04/09/2011   SpO2 96%   BMI 27.67 kg/m   BP/Weight 01/03/2016 [id number]  Systolic BP 123XX123 [id number]  Diastolic BP 71 94 70  Wt. (Lbs) 156.2 157 160  BMI 27.67 27.81 28.35      Physical Exam  Constitutional: She appears well-developed and well-nourished.  HENT:  Right Ear: Tympanic membrane, external ear and ear canal normal.  Left Ear: Tympanic membrane, external ear and ear canal normal.  Nose: Nose normal.  Mouth/Throat: Uvula is midline, oropharynx is clear and moist and mucous membranes are normal.  Cardiovascular: Normal rate, regular rhythm and normal heart sounds.   Pulmonary/Chest: Effort normal and breath sounds normal.  Abdominal: Soft. Bowel sounds are normal. She exhibits distension. She exhibits no abdominal bruit. There is tenderness in the left lower quadrant. There is no rigidity and no guarding.     Assessment & Plan:   1. Lower abdominal pain Miralax reordered.  Return within 1 month if symptoms do not improve.  2. Essential hypertension Patient reports not taking BP medication prior to visit, BP 108/71. Lotrel discontinued and Lisinopril started. - Basic Metabolic Panel - CBC with Differential  3. Gastroesophageal reflux  disease without esophagitis Increased PPI dosage. Return in 2 months for follow up.  4. Hyperlipidemia, unspecified hyperlipidemia type Atorvastatin reordered. - CBC with Differential - Lipid Panel  5. Health care maintenance Patient declined flu and tetanus vaccinations. - Hepatitis C Antibody  6. Tobacco Abuse Wellbutrin reordered. Patient declines nicotine patches, nicotine gum, and other additional pharmaceutical interventions for smoking  cessation at this visit. Educated on health risks associated with smoking and encouraged to quit.  7. Insomnia Trazodone reordered.   Meds ordered this encounter  Medications  . lisinopril (PRINIVIL,ZESTRIL) 10 MG tablet    Sig: Take 1 tablet (10 mg total) by mouth daily.    Dispense:  30 tablet    Refill:  0    Order Specific Question:   Supervising Provider    Answer:   Tresa Garter [id number]  . atorvastatin (LIPITOR) 10 MG tablet    Sig: Take 1 tablet (10 mg total) by mouth daily.    Dispense:  90 tablet    Refill:  3    Order Specific Question:   Supervising Provider    Answer:   Tresa Garter [id number]  . buPROPion (WELLBUTRIN) 75 MG tablet    Sig: Take 1 tablet (75 mg total) by mouth 2 (two) times daily. Reported on 03/12/2015    Dispense:  60 tablet    Refill:  0    Must have office visit for refills    Order Specific Question:   Supervising Provider    Answer:   Tresa Garter [id number]  . polyethylene glycol powder (GLYCOLAX/MIRALAX) powder    Sig: Take 17 g by mouth 2 (two) times daily as needed for mild constipation.    Dispense:  255 g    Refill:  0    Must have office visit for refills    Order Specific Question:   Supervising Provider    Answer:   Tresa Garter [id number]  . omeprazole (PRILOSEC) 40 MG capsule    Sig: Take 1 capsule (40 mg total) by mouth 2 (two) times daily.    Dispense:  60 capsule    Refill:  1    Order Specific Question:   Supervising Provider    Answer:   Tresa Garter [id number]  . traZODone (DESYREL) 50 MG tablet    Sig: Take 1 tablet (50 mg total) by mouth at bedtime as needed for sleep.    Dispense:  30 tablet    Refill:  3    Order Specific Question:   Supervising Provider    Answer:   Tresa Garter [id number]    Follow-up:  Return in within 1 month if LLQ abdominal symptoms do not improve. Return in 2 months for GERD follow-up.  Alfonse Spruce FNP

## 2016-01-04 LAB — HEPATITIS C ANTIBODY: HCV AB: NEGATIVE

## 2016-01-05 ENCOUNTER — Telehealth: Payer: Self-pay

## 2016-01-05 NOTE — Telephone Encounter (Addendum)
Patient fully hipaa verif per protocol. RN advised per Alfonse Spruce, FNP   Hepatitis C is negative.  Cholesterol levels are normal continue to take medication as prescribed. Continue low-fat diet. Quit smoking. Increase the amount of iron in your diet. Good sources of iron are leafy greens, meats, and beans. Return in within 1 month if LLQ abdominal symptoms do not improve. Return in 2 months for GERD follow-up.  Patient verbalized understanding.

## 2016-03-13 ENCOUNTER — Other Ambulatory Visit: Payer: Self-pay | Admitting: Family Medicine

## 2016-03-13 ENCOUNTER — Telehealth: Payer: Self-pay | Admitting: Family Medicine

## 2016-03-13 NOTE — Telephone Encounter (Signed)
Lisinopril refilled - due for follow up with [name]

## 2016-03-13 NOTE — Telephone Encounter (Signed)
Patient called the office to request medication refill for lisinopril (PRINIVIL,ZESTRIL) 10 MG tablet. Please send it to [facility] on [address].   Thank you.

## 2016-03-16 ENCOUNTER — Telehealth: Payer: Self-pay | Admitting: Family Medicine

## 2016-03-16 ENCOUNTER — Ambulatory Visit: Payer: Medicaid Other | Attending: Family Medicine | Admitting: Family Medicine

## 2016-03-16 ENCOUNTER — Ambulatory Visit (HOSPITAL_COMMUNITY)
Admission: RE | Admit: 2016-03-16 | Discharge: 2016-03-16 | Disposition: A | Discharge status: Home / Self Care | Payer: Medicaid Other | Source: Ambulatory Visit | Attending: Family Medicine | Admitting: Family Medicine

## 2016-03-16 VITALS — BP 148/94 | HR 80 | Temp 98.3°F | Resp 18 | Ht 64.0 in | Wt 155.2 lb

## 2016-03-16 DIAGNOSIS — J984 Other disorders of lung: Secondary | ICD-10-CM | POA: Diagnosis not present

## 2016-03-16 DIAGNOSIS — Z79899 Other long term (current) drug therapy: Secondary | ICD-10-CM | POA: Insufficient documentation

## 2016-03-16 DIAGNOSIS — R0781 Pleurodynia: Secondary | ICD-10-CM | POA: Diagnosis not present

## 2016-03-16 DIAGNOSIS — I517 Cardiomegaly: Secondary | ICD-10-CM | POA: Diagnosis not present

## 2016-03-16 DIAGNOSIS — R05 Cough: Secondary | ICD-10-CM

## 2016-03-16 DIAGNOSIS — J069 Acute upper respiratory infection, unspecified: Secondary | ICD-10-CM | POA: Diagnosis not present

## 2016-03-16 DIAGNOSIS — R059 Cough, unspecified: Secondary | ICD-10-CM

## 2016-03-16 DIAGNOSIS — R0981 Nasal congestion: Secondary | ICD-10-CM

## 2016-03-16 DIAGNOSIS — R52 Pain, unspecified: Secondary | ICD-10-CM

## 2016-03-16 MED ORDER — FLUTICASONE PROPIONATE 50 MCG/ACT NA SUSP: 2.0000 | Freq: Every day | NASAL | 0 refills | Status: DC

## 2016-03-16 MED ORDER — BENZONATATE 100 MG PO CAPS: 100.0000 mg | ORAL_CAPSULE | Freq: Three times a day (TID) | ORAL | 0 refills | Status: DC | PRN

## 2016-03-16 MED ORDER — PHENOL 1.4 % MT LIQD: 1.0000 | OROMUCOSAL | 0 refills | Status: DC | PRN

## 2016-03-16 MED ORDER — OSELTAMIVIR PHOSPHATE 75 MG PO CAPS: 75.0000 mg | ORAL_CAPSULE | Freq: Two times a day (BID) | ORAL | 0 refills | Status: DC

## 2016-03-16 NOTE — Patient Instructions (Addendum)
Upper Respiratory Infection, Adult Most upper respiratory infections (URIs) are caused by a virus. A URI affects the nose, throat, and upper air passages. The most common type of URI is often called "the common cold." Follow these instructions at home:  Take medicines only as told by your doctor.  Gargle warm saltwater or take cough drops to comfort your throat as told by your doctor.  Use a warm mist humidifier or inhale steam from a shower to increase air moisture. This may make it easier to breathe.  Drink enough fluid to keep your pee (urine) clear or pale yellow.  Eat soups and other clear broths.  Have a healthy diet.  Rest as needed.  Go back to work when your fever is gone or your doctor says it is okay.  You may need to stay home longer to avoid giving your URI to others.  You can also wear a face mask and wash your hands often to prevent spread of the virus.  Use your inhaler more if you have asthma.  Do not use any tobacco products, including cigarettes, chewing tobacco, or electronic cigarettes. If you need help quitting, ask your doctor. Contact a doctor if:  You are getting worse, not better.  Your symptoms are not helped by medicine.  You have chills.  You are getting more short of breath.  You have brown or red mucus.  You have yellow or brown discharge from your nose.  You have pain in your face, especially when you bend forward.  You have a fever.  You have puffy (swollen) neck glands.  You have pain while swallowing.  You have white areas in the back of your throat. Get help right away if:  You have very bad or constant:  Headache.  Ear pain.  Pain in your forehead, behind your eyes, and over your cheekbones (sinus pain).  Chest pain.  You have long-lasting (chronic) lung disease and any of the following:  Wheezing.  Long-lasting cough.  Coughing up blood.  A change in your usual mucus.  You have a stiff neck.  You have  changes in your:  Vision.  Hearing.  Thinking.  Mood. This information is not intended to replace advice given to you by your health care provider. Make sure you discuss any questions you have with your health care provider. Document Released: 07/12/2007 Document Revised: 09/26/2015 Document Reviewed: 04/30/2013 Elsevier Interactive Patient Education  2017 [employer] Inc.  

## 2016-03-16 NOTE — Progress Notes (Signed)
Patient is here cold SX  Patient started coughing runny nose and sneezing since yesterday  Patient started body ache and chest pain today  Patient been taking cough and chest relieve  Patient has taking her current meds today  Patient has not eaten today  Patient stated she has had a fever  Patient stated that's he has headaches that comes and goes

## 2016-03-16 NOTE — Progress Notes (Signed)
Subjective:  Patient ID: Laura Huerta, female    DOB: 18-Feb-1965  Age: 51 y.o. MRN: [id number]  CC: Establish Care   HPI Laura Huerta presents for complaint of cough, runny nose, and sore throat. She reports symptoms began yesterday and have worsened. She reports cough is dry. She denies any fevers. She reports body aches. She is a current smoker a pack and a half per day.   Outpatient Medications Prior to Visit  Medication Sig Dispense Refill  . atorvastatin (LIPITOR) 10 MG tablet Take 1 tablet (10 mg total) by mouth daily. 90 tablet 3  . buPROPion (WELLBUTRIN) 75 MG tablet Take 1 tablet (75 mg total) by mouth 2 (two) times daily. Reported on 03/12/2015 60 tablet 0  . lisinopril (PRINIVIL,ZESTRIL) 10 MG tablet take 1 tablet by mouth once daily 30 tablet 0  . polyethylene glycol powder (GLYCOLAX/MIRALAX) powder Take 17 g by mouth 2 (two) times daily as needed for mild constipation. 255 g 0  . traZODone (DESYREL) 50 MG tablet Take 1 tablet (50 mg total) by mouth at bedtime as needed for sleep. 30 tablet 3  . omeprazole (PRILOSEC) 40 MG capsule Take 1 capsule (40 mg total) by mouth 2 (two) times daily. (Patient not taking: Reported on 03/16/2016) 60 capsule 1   No facility-administered medications prior to visit.     ROS Review of Systems  Constitutional: Positive for fatigue.  HENT: Positive for postnasal drip, rhinorrhea and sore throat.   Eyes: Negative.   Respiratory: Positive for cough.   Cardiovascular: Negative.   Musculoskeletal: Positive for myalgias.        Objective:  BP (!) 148/94 (BP Location: Right Arm, Patient Position: Sitting, Cuff Size: Normal)   Pulse 80   Temp 98.3 F (36.8 C) (Oral)   Resp 18   Ht 5\' 4"  (1.626 m)   Wt 155 lb 3.2 oz (70.4 kg)   LMP 05/11/2011   SpO2 96%   BMI 26.64 kg/m   BP/Weight 03/16/2016 01/03/2016 [id number]  Systolic BP 123456 [id number]  Diastolic BP 94 71 94  Wt. (Lbs) 155.2 156.2 157  BMI 26.64 27.67 27.81      Physical Exam  HENT:  Right Ear: External ear normal.  Left Ear: External ear normal.  Nose: Mucosal edema and rhinorrhea present.  Mouth/Throat: Posterior oropharyngeal erythema present.  Eyes: Conjunctivae are normal. Pupils are equal, round, and reactive to light.  Cardiovascular: Normal rate, regular rhythm, normal heart sounds and intact distal pulses.   Pulmonary/Chest: Effort normal and breath sounds normal.  Abdominal: Soft. Bowel sounds are normal.  Lymphadenopathy:    She has no cervical adenopathy.     Assessment & Plan:   Problem List Items Addressed This Visit    None    Visit Diagnoses    URI, acute    -  Primary   Relevant Medications   oseltamivir (TAMIFLU) 75 MG capsule   Other Relevant Orders   Respiratory virus panel (Completed)   DG Chest 2 View (Completed)   Pleuritic chest pain       Relevant Orders   DG Chest 2 View (Completed)   Body aches       Relevant Medications   oseltamivir (TAMIFLU) 75 MG capsule   Nasal congestion       Relevant Medications   fluticasone (FLONASE) 50 MCG/ACT nasal spray   Cough       Relevant Medications   benzonatate (TESSALON) 100 MG capsule   Other Relevant Orders  DG Chest 2 View (Completed)      Meds ordered this encounter  Medications  . benzonatate (TESSALON) 100 MG capsule    Sig: Take 1 capsule (100 mg total) by mouth 3 (three) times daily as needed for cough.    Dispense:  30 capsule    Refill:  0    Order Specific Question:   Supervising Provider    Answer:   Tresa Garter [id number]  . fluticasone (FLONASE) 50 MCG/ACT nasal spray    Sig: Place 2 sprays into both nostrils daily. For 7 day.    Dispense:  16 g    Refill:  0    Order Specific Question:   Supervising Provider    Answer:   Tresa Garter [id number]  . oseltamivir (TAMIFLU) 75 MG capsule    Sig: Take 1 capsule (75 mg total) by mouth 2 (two) times daily.    Dispense:  10 capsule    Refill:  0    Order Specific  Question:   Supervising Provider    Answer:   Tresa Garter [id number]  . phenol (CHLORASEPTIC) 1.4 % LIQD    Sig: Use as directed 1 spray in the mouth or throat as needed for throat irritation / pain.    Refill:  0    Order Specific Question:   Supervising Provider    Answer:   Tresa Garter [id number]    Follow-up: Return if symptoms worsen or fail to improve.   Alfonse Spruce FNP

## 2016-03-16 NOTE — Telephone Encounter (Signed)
Pt calling stating she is experiencing cold symptoms such as a runny nose Pt also states she is having pain in her chest  Scheduled pt with PCP this afternoon at 1:30 and routing to triage nurse for evaluation of chest pain

## 2016-03-17 LAB — RESPIRATORY VIRUS PANEL
Adenovirus B: NOT DETECTED
INFLUENZA A H1: NOT DETECTED
INFLUENZA A H3: NOT DETECTED
INFLUENZA A: NOT DETECTED
INFLUENZA B 1: NOT DETECTED
METAPNEUMOVIRUS: NOT DETECTED
PARAINFLUENZA 3 A: NOT DETECTED
Parainfluenza 1: NOT DETECTED
Parainfluenza 2: NOT DETECTED
RESPIRATORY SYNCYTIAL VIRUS A: NOT DETECTED
RHINOVIRUS: NOT DETECTED
Respiratory Syncytial Virus B: NOT DETECTED

## 2016-03-20 ENCOUNTER — Ambulatory Visit (INDEPENDENT_AMBULATORY_CARE_PROVIDER_SITE_OTHER): Payer: Medicaid Other | Admitting: Neurology

## 2016-03-20 ENCOUNTER — Encounter: Payer: Self-pay | Admitting: Neurology

## 2016-03-20 VITALS — BP 129/82 | HR 75 | Ht 64.0 in | Wt 152.5 lb

## 2016-03-20 DIAGNOSIS — R269 Unspecified abnormalities of gait and mobility: Secondary | ICD-10-CM | POA: Diagnosis not present

## 2016-03-20 NOTE — Progress Notes (Signed)
Reason for visit: Falls  Laura Huerta is an 51 y.o. female  History of present illness:  Laura Huerta is a 51 year old right-handed black female with a history of multiple episodes of falling. The patient has undergone a workup that includes MRI of the brain that shows minimal white matter changes, she has had an EEG study that was normal, carotid Doppler study was unremarkable, and a 30 day cardiac monitor study was unremarkable. The patient did have falling spells while being monitored. She indicates that during the month of January she had no real problems with falling, but within the last 2 weeks the falls have started back again. She returns for an evaluation. She has never hurt herself during any of the falls. She always goes straight down, she does not topple. I She claims that if she stiffens her legs, she can walk better. The patient does not black out with the falls.  Past Medical History:  Diagnosis Date  . Abnormality of gait 11/18/2014  . Acid reflux   . Anxiety   . Bronchitis   . Depression   . Hyperlipidemia   . Hypertension     Past Surgical History:  Procedure Laterality Date  . CESAREAN SECTION    . HEMORRHOID BANDING  2015  . TUBAL LIGATION      Family History  Problem Relation Age of Onset  . Hypertension Mother   . Heart failure Mother     Died age 51  . COPD Mother   . Asthma Mother   . Diabetes Mother   . Depression Mother   . Hypertension Father   . Diabetes Father   . Breast cancer Maternal Aunt     Social history:  reports that she has been smoking Cigarettes.  She has a 20.00 pack-year smoking history. She has never used smokeless tobacco. She reports that she drinks alcohol. She reports that she does not use drugs.   No Known Allergies  Medications:  Prior to Admission medications   Medication Sig Start Date End Date Taking? Authorizing Provider  atorvastatin (LIPITOR) 10 MG tablet Take 1 tablet (10 mg total) by mouth daily.  01/03/16  Yes Alfonse Spruce, FNP  benzonatate (TESSALON) 100 MG capsule Take 1 capsule (100 mg total) by mouth 3 (three) times daily as needed for cough. 03/16/16  Yes Alfonse Spruce, FNP  buPROPion (WELLBUTRIN) 75 MG tablet Take 1 tablet (75 mg total) by mouth 2 (two) times daily. Reported on 03/12/2015 01/03/16  Yes Mandesia R Hairston, FNP  fluticasone (FLONASE) 50 MCG/ACT nasal spray Place 2 sprays into both nostrils daily. For 7 day. 03/16/16  Yes Alfonse Spruce, FNP  lisinopril (PRINIVIL,ZESTRIL) 10 MG tablet take 1 tablet by mouth once daily 03/13/16  Yes Alfonse Spruce, FNP  omeprazole (PRILOSEC) 40 MG capsule Take 1 capsule (40 mg total) by mouth 2 (two) times daily. 01/03/16  Yes Alfonse Spruce, FNP  oseltamivir (TAMIFLU) 75 MG capsule Take 1 capsule (75 mg total) by mouth 2 (two) times daily. 03/16/16  Yes Mandesia R Hairston, FNP  phenol (CHLORASEPTIC) 1.4 % LIQD Use as directed 1 spray in the mouth or throat as needed for throat irritation / pain. 03/16/16  Yes Alfonse Spruce, FNP  polyethylene glycol powder (GLYCOLAX/MIRALAX) powder Take 17 g by mouth 2 (two) times daily as needed for mild constipation. 01/03/16  Yes Alfonse Spruce, FNP  traZODone (DESYREL) 50 MG tablet Take 1 tablet (50 mg total) by mouth at  bedtime as needed for sleep. 01/03/16  Yes Alfonse Spruce, FNP    ROS:  Out of a complete 14 system review of symptoms, the patient complains only of the following symptoms, and all other reviewed systems are negative.  Ear pain, ringing in the ears Restless legs  Blood pressure 129/82, pulse 75, height 5\' 4"  (1.626 m), weight 152 lb 8 oz (69.2 kg), last menstrual period 05/11/2011.  Physical Exam  General: The patient is alert and cooperative at the time of the examination.  Skin: No significant peripheral edema is noted.   Neurologic Exam  Mental status: The patient is alert and oriented x 3 at the time of the examination. The patient  has apparent normal recent and remote memory, with an apparently normal attention span and concentration ability.   Cranial nerves: Facial symmetry is present. Speech is normal, no aphasia or dysarthria is noted. Extraocular movements are full. Visual fields are full.  Motor: The patient has good strength in all 4 extremities.  Sensory examination: Soft touch sensation is symmetric on the face, arms, and legs.  Coordination: The patient has good finger-nose-finger and heel-to-shin bilaterally.  Gait and station: The patient has a normal gait. Tandem gait is normal. Romberg is negative. No drift is seen. The patient demonstrated a fall during the examination, she squatted down, catching herself with her hands. Between the episodes, the walking is normal.  Reflexes: Deep tendon reflexes are symmetric.   Assessment/Plan:  1. Multiple falls  The clinical examination is normal, and the neurologic workup has been unremarkable. I suspect that the episodes of falling are psychogenic in nature. The lack of injuries, long duration of time with no falls, and then multiple falls daily suggests a nonorganic etiology. The patient has requested a second opinion through [facility]. I will arrange for this referral.  Jill Alexanders MD 03/20/2016 4:25 PM  [facility] [address] [place], [place] [address]  Phone [phone] Fax [phone]

## 2016-03-22 ENCOUNTER — Telehealth: Payer: Self-pay | Admitting: Family Medicine

## 2016-03-22 ENCOUNTER — Encounter: Payer: Self-pay | Admitting: Family Medicine

## 2016-03-22 NOTE — Telephone Encounter (Signed)
Message left for patient to call back for results.

## 2016-03-23 ENCOUNTER — Other Ambulatory Visit: Payer: Self-pay

## 2016-03-23 ENCOUNTER — Encounter: Payer: Self-pay | Admitting: Family Medicine

## 2016-03-23 ENCOUNTER — Ambulatory Visit: Payer: Medicaid Other | Attending: Family Medicine | Admitting: Family Medicine

## 2016-03-23 VITALS — BP 113/74 | HR 80 | Temp 98.3°F | Resp 18 | Ht 64.0 in | Wt 154.2 lb

## 2016-03-23 DIAGNOSIS — Z8679 Personal history of other diseases of the circulatory system: Secondary | ICD-10-CM

## 2016-03-23 DIAGNOSIS — H538 Other visual disturbances: Secondary | ICD-10-CM | POA: Insufficient documentation

## 2016-03-23 DIAGNOSIS — I10 Essential (primary) hypertension: Secondary | ICD-10-CM | POA: Insufficient documentation

## 2016-03-23 DIAGNOSIS — F329 Major depressive disorder, single episode, unspecified: Secondary | ICD-10-CM | POA: Diagnosis not present

## 2016-03-23 DIAGNOSIS — Z79899 Other long term (current) drug therapy: Secondary | ICD-10-CM | POA: Diagnosis not present

## 2016-03-23 DIAGNOSIS — Z1239 Encounter for other screening for malignant neoplasm of breast: Secondary | ICD-10-CM

## 2016-03-23 DIAGNOSIS — E785 Hyperlipidemia, unspecified: Secondary | ICD-10-CM | POA: Insufficient documentation

## 2016-03-23 DIAGNOSIS — Z1231 Encounter for screening mammogram for malignant neoplasm of breast: Secondary | ICD-10-CM

## 2016-03-23 DIAGNOSIS — G4700 Insomnia, unspecified: Secondary | ICD-10-CM | POA: Diagnosis not present

## 2016-03-23 DIAGNOSIS — I517 Cardiomegaly: Secondary | ICD-10-CM | POA: Diagnosis not present

## 2016-03-23 DIAGNOSIS — F32A Depression, unspecified: Secondary | ICD-10-CM | POA: Insufficient documentation

## 2016-03-23 DIAGNOSIS — F17210 Nicotine dependence, cigarettes, uncomplicated: Secondary | ICD-10-CM | POA: Insufficient documentation

## 2016-03-23 DIAGNOSIS — E782 Mixed hyperlipidemia: Secondary | ICD-10-CM

## 2016-03-23 DIAGNOSIS — Z7951 Long term (current) use of inhaled steroids: Secondary | ICD-10-CM | POA: Diagnosis not present

## 2016-03-23 DIAGNOSIS — F17200 Nicotine dependence, unspecified, uncomplicated: Secondary | ICD-10-CM

## 2016-03-23 LAB — LIPID PANEL
CHOL/HDL RATIO: 2.7 ratio (ref <5.0)
CHOLESTEROL: 120 mg/dL (ref <200)
HDL: 44 mg/dL — ABNORMAL LOW (ref >50)
LDL Cholesterol: 54 mg/dL (ref <100)
Triglycerides: 112 mg/dL (ref <150)
VLDL: 22 mg/dL (ref <30)

## 2016-03-23 LAB — HEPATIC FUNCTION PANEL
ALBUMIN: 4 g/dL (ref 3.6–5.1)
ALK PHOS: 71 U/L (ref 33–130)
ALT: 8 U/L (ref 6–29)
AST: 13 U/L (ref 10–35)
BILIRUBIN DIRECT: 0.1 mg/dL (ref <=0.2)
BILIRUBIN TOTAL: 0.3 mg/dL (ref 0.2–1.2)
Indirect Bilirubin: 0.2 mg/dL (ref 0.2–1.2)
Total Protein: 7 g/dL (ref 6.1–8.1)

## 2016-03-23 MED ORDER — LISINOPRIL 10 MG PO TABS: 10.0000 mg | ORAL_TABLET | Freq: Every day | ORAL | 2 refills | Status: DC

## 2016-03-23 MED ORDER — TRAZODONE HCL 50 MG PO TABS: 50.0000 mg | ORAL_TABLET | Freq: Every evening | ORAL | 2 refills | Status: DC | PRN

## 2016-03-23 MED ORDER — BUPROPION HCL ER (SR) 150 MG PO TB12: 150.0000 mg | ORAL_TABLET | Freq: Two times a day (BID) | ORAL | 2 refills | Status: DC

## 2016-03-23 MED ORDER — NICOTINE 21 MG/24HR TD PT24: 21.0000 mg | MEDICATED_PATCH | Freq: Every day | TRANSDERMAL | 1 refills | Status: DC

## 2016-03-23 NOTE — Progress Notes (Signed)
Subjective:  Patient ID: Laura Huerta, female    DOB: February 21, 1965  Age: 51 y.o. MRN: [id number]  CC: Establish Care   HPI Laura Huerta presents for  follow-up. She was informed of her lab and imaging results. She reports symptoms completely resolved since last visit. Recommended follow-up EKG and echocardiogram. She is a current every day smoker 1-1/2-2 packs per day. She reports readiness to quit smoking. She also complains of 3 months history of blurry vision. She denies any polydipsia or polyuria. She reports some improvement in insomnia and depression symptoms with current medication regimen. She denies any SI/HI.   Outpatient Medications Prior to Visit  Medication Sig Dispense Refill  . atorvastatin (LIPITOR) 10 MG tablet Take 1 tablet (10 mg total) by mouth daily. 90 tablet 3  . benzonatate (TESSALON) 100 MG capsule Take 1 capsule (100 mg total) by mouth 3 (three) times daily as needed for cough. 30 capsule 0  . fluticasone (FLONASE) 50 MCG/ACT nasal spray Place 2 sprays into both nostrils daily. For 7 day. 16 g 0  . omeprazole (PRILOSEC) 40 MG capsule Take 1 capsule (40 mg total) by mouth 2 (two) times daily. 60 capsule 1  . oseltamivir (TAMIFLU) 75 MG capsule Take 1 capsule (75 mg total) by mouth 2 (two) times daily. 10 capsule 0  . buPROPion (WELLBUTRIN) 75 MG tablet Take 1 tablet (75 mg total) by mouth 2 (two) times daily. Reported on 03/12/2015 60 tablet 0  . lisinopril (PRINIVIL,ZESTRIL) 10 MG tablet take 1 tablet by mouth once daily 30 tablet 0  . traZODone (DESYREL) 50 MG tablet Take 1 tablet (50 mg total) by mouth at bedtime as needed for sleep. 30 tablet 3  . phenol (CHLORASEPTIC) 1.4 % LIQD Use as directed 1 spray in the mouth or throat as needed for throat irritation / pain. (Patient not taking: Reported on 03/23/2016)  0  . polyethylene glycol powder (GLYCOLAX/MIRALAX) powder Take 17 g by mouth 2 (two) times daily as needed for mild constipation. (Patient not taking:  Reported on 03/23/2016) 255 g 0   No facility-administered medications prior to visit.     ROS Review of Systems  Constitutional: Negative.   Eyes: Negative.   Respiratory: Negative.   Cardiovascular: Negative.   Gastrointestinal: Negative.   Skin: Negative.     Objective:  BP 113/74 (BP Location: Left Arm, Patient Position: Sitting, Cuff Size: Normal)   Pulse 80   Temp 98.3 F (36.8 C) (Oral)   Resp 18   Ht 5\' 4"  (1.626 m)   Wt 154 lb 3.2 oz (69.9 kg)   LMP 05/11/2011   SpO2 96%   BMI 26.47 kg/m   BP/Weight 03/23/2016 123XX123 [id number]  Systolic BP 123456 Q000111Q 123456  Diastolic BP 74 82 94  Wt. (Lbs) 154.2 152.5 155.2  BMI 26.47 26.18 26.64   Physical Exam  Eyes: Conjunctivae are normal. Pupils are equal, round, and reactive to light.  Cardiovascular: Normal rate, regular rhythm, normal heart sounds and intact distal pulses.   Pulmonary/Chest: Effort normal and breath sounds normal.  Abdominal: Soft. Bowel sounds are normal.  Skin: Skin is warm and dry.  Nursing note and vitals reviewed.   Assessment & Plan:   Problem List Items Addressed This Visit      Cardiovascular and Mediastinum   Hypertension - Primary   Relevant Medications   lisinopril (PRINIVIL,ZESTRIL) 10 MG tablet     Other   Hyperlipidemia   Relevant Medications   lisinopril (PRINIVIL,ZESTRIL) 10 MG  tablet   Other Relevant Orders   Lipid Panel (Completed)   Hepatic Function Panel (Completed)   Depression   Relevant Medications   traZODone (DESYREL) 50 MG tablet   buPROPion (WELLBUTRIN SR) 150 MG 12 hr tablet    Other Visit Diagnoses    History of cardiomegaly       Relevant Orders   ECHOCARDIOGRAM COMPLETE   Breast cancer screening       Relevant Orders   MM SCREENING BREAST TOMO BILATERAL   Ready to quit smoking       Relevant Medications   nicotine (NICODERM CQ - DOSED IN MG/24 HOURS) 21 mg/24hr patch   Insomnia, unspecified type       Relevant Medications   traZODone (DESYREL) 50 MG  tablet   buPROPion (WELLBUTRIN SR) 150 MG 12 hr tablet   Blurry vision, bilateral       -Vision acuity screen.    Relevant Orders   Ambulatory referral to Ophthalmology      Meds ordered this encounter  Medications  . lisinopril (PRINIVIL,ZESTRIL) 10 MG tablet    Sig: Take 1 tablet (10 mg total) by mouth daily.    Dispense:  30 tablet    Refill:  2    Order Specific Question:   Supervising Provider    Answer:   Tresa Garter [id number]  . traZODone (DESYREL) 50 MG tablet    Sig: Take 1 tablet (50 mg total) by mouth at bedtime as needed for sleep.    Dispense:  30 tablet    Refill:  2    Order Specific Question:   Supervising Provider    Answer:   Tresa Garter [id number]  . buPROPion (WELLBUTRIN SR) 150 MG 12 hr tablet    Sig: Take 1 tablet (150 mg total) by mouth 2 (two) times daily.    Dispense:  60 tablet    Refill:  2    Order Specific Question:   Supervising Provider    Answer:   Tresa Garter [id number]  . nicotine (NICODERM CQ - DOSED IN MG/24 HOURS) 21 mg/24hr patch    Sig: Place 1 patch (21 mg total) onto the skin daily.    Dispense:  28 patch    Refill:  1    Order Specific Question:   Supervising Provider    Answer:   Tresa Garter [id number]    Follow-up: Return in about 3 months (around 06/20/2016) for HTN & Depression.   Alfonse Spruce FNP

## 2016-03-23 NOTE — Progress Notes (Signed)
Patient is here for F/UP GERD  Patient have eaten today  Patient has taking her meds today  Patient stated  That she falls everytime she walks like collapse  Patient denies pain today

## 2016-03-23 NOTE — Patient Instructions (Addendum)
Come back in 3 months for Hypertension.  Nicotine skin patches What is this medicine? NICOTINE ([place]) helps people stop smoking. The patches replace the nicotine found in cigarettes and help to decrease withdrawal effects. They are most effective when used in combination with a stop-smoking program. This medicine may be used for other purposes; ask your health care provider or pharmacist if you have questions. COMMON BRAND NAME(S): Habitrol, Nicoderm CQ, Nicotrol What should I tell my health care provider before I take this medicine? They need to know if you have any of these conditions: -diabetes -heart disease, angina, irregular heartbeat or previous heart attack -high blood pressure -lung disease, including asthma -overactive thyroid -pheochromocytoma -seizures or a history of seizures -skin problems, like eczema -stomach problems or ulcers -an unusual or allergic reaction to nicotine, adhesives, other medicines, foods, dyes, or preservatives -pregnant or trying to get pregnant -breast-feeding How should I use this medicine? This medicine is for use on the skin. Follow the directions that come with the patches. Find an area of skin on your upper arm, chest, or back that is clean, dry, greaseless, undamaged and hairless. Wash hands with plain soap and water. Do not use anything that contains aloe, lanolin or glycerin as these may prevent the patch from sticking. Dry thoroughly. Remove the patch from the sealed pouch. Do not try to cut or trim the patch. Using your palm, press the patch firmly in place for 10 seconds to make sure that there is good contact with your skin. After applying the patch, wash your hands. Change the patch every day, keeping to a regular schedule. When you apply a new patch, use a new area of skin. Wait at least 1 week before using the same area again. Talk to your pediatrician regarding the use of this medicine in children. Special care may be  needed. Overdosage: If you think you have taken too much of this medicine contact a poison control center or emergency room at once. NOTE: This medicine is only for you. Do not share this medicine with others. What if I miss a dose? If you forget to replace a patch, use it as soon as you can. Only use one patch at a time and do not leave on the skin for longer than directed. If a patch falls off, you can replace it, but keep to your schedule and remove the patch at the right time. What may interact with this medicine? -medicines for asthma -medicines for blood pressure -medicines for mental depression This list may not describe all possible interactions. Give your health care provider a list of all the medicines, herbs, non-prescription drugs, or dietary supplements you use. Also tell them if you smoke, drink alcohol, or use illegal drugs. Some items may interact with your medicine. What should I watch for while using this medicine? You should begin using the nicotine patch the day you stop smoking. It is okay if you do not succeed at your attempt to quit and have a cigarette. You can still continue your quit attempt and keep using the product as directed. Just throw away your cigarettes and get back to your quit plan. You can keep the patch in place during swimming, bathing, and showering. If your patch falls off during these activities, replace it. When you first apply the patch, your skin may itch or burn. This should go away soon. When you remove a patch, the skin may look red, but this should only last for a few  days. Call your doctor or health care professional if skin redness does not go away after 4 days, if your skin swells, or if you get a rash. If you are a diabetic and you quit smoking, the effects of insulin may be increased and you may need to reduce your insulin dose. Check with your doctor or health care professional about how you should adjust your insulin dose. If you are going to  have a magnetic resonance imaging (MRI) procedure, tell your MRI technician if you have this patch on your body. It must be removed before a MRI. What side effects may I notice from receiving this medicine? Side effects that you should report to your doctor or health care professional as soon as possible: -allergic reactions like skin rash, itching or hives, swelling of the face, lips, or tongue -breathing problems -changes in hearing -changes in vision -chest pain -cold sweats -confusion -fast, irregular heartbeat -feeling faint or lightheaded, falls -headache -increased saliva -skin redness that lasts more than 4 days -stomach pain -signs and symptoms of nicotine overdose like nausea; vomiting; dizziness; weakness; and rapid heartbeat Side effects that usually do not require medical attention (report to your doctor or health care professional if they continue or are bothersome): -diarrhea -dry mouth -hiccups -irritability -nervousness or restlessness -trouble sleeping or vivid dreams This list may not describe all possible side effects. Call your doctor for medical advice about side effects. You may report side effects to FDA at 1-800-FDA-1088. Where should I keep my medicine? Keep out of the reach of children. Store at room temperature between 20 and 25 degrees C (68 and 77 degrees F). Protect from heat and light. Store in International aid/development worker until ready to use. Throw away unused medicine after the expiration date. When you remove a patch, fold with sticky sides together; put in an empty opened pouch and throw away. NOTE: This sheet is a summary. It may not cover all possible information. If you have questions about this medicine, talk to your doctor, pharmacist, or health care provider.  2017 Elsevier/Gold Standard (2013-12-22 15:46:21)

## 2016-03-30 ENCOUNTER — Ambulatory Visit (HOSPITAL_COMMUNITY)
Admission: RE | Admit: 2016-03-30 | Discharge: 2016-03-30 | Disposition: A | Discharge status: Home / Self Care | Payer: Medicaid Other | Source: Ambulatory Visit | Attending: Family Medicine | Admitting: Family Medicine

## 2016-03-30 DIAGNOSIS — I517 Cardiomegaly: Secondary | ICD-10-CM | POA: Insufficient documentation

## 2016-03-30 DIAGNOSIS — Z8679 Personal history of other diseases of the circulatory system: Secondary | ICD-10-CM | POA: Diagnosis not present

## 2016-03-30 LAB — ECHOCARDIOGRAM COMPLETE
CHL CUP MV DEC (S): 151
CHL CUP RV SYS PRESS: 38 mmHg
E decel time: 151 msec
E/e' ratio: 14.29
FS: 34 % (ref 28–44)
IV/PV OW: 0.94
LA ID, A-P, ES: 37 mm
LA vol index: 22.3 mL/m2
LADIAMINDEX: 2.06 cm/m2
LAVOL: 39.9 mL
LAVOLA4C: 32.3 mL
LDCA: 2.54 cm2
LEFT ATRIUM END SYS DIAM: 37 mm
LV E/e'average: 14.29
LV TDI E'LATERAL: 7.07
LVEEMED: 14.29
LVELAT: 7.07 cm/s
LVOT VTI: 23.9 cm
LVOT peak grad rest: 6 mmHg
LVOT peak vel: 126 cm/s
LVOTD: 18 mm
LVOTSV: 61 mL
MV pk A vel: 109 m/s
MVPG: 4 mmHg
MVPKEVEL: 101 m/s
PW: 9.68 mm — AB (ref 0.6–1.1)
RV LATERAL S' VELOCITY: 11.6 cm/s
Reg peak vel: 295 cm/s
TAPSE: 17.3 mm
TDI e' medial: 6.42
TRMAXVEL: 295 cm/s

## 2016-03-30 NOTE — Progress Notes (Signed)
  Echocardiogram 2D Echocardiogram has been performed.  Laura Huerta 03/30/2016, 3:43 PM

## 2016-04-04 ENCOUNTER — Telehealth: Payer: Self-pay | Admitting: Neurology

## 2016-04-04 DIAGNOSIS — R269 Unspecified abnormalities of gait and mobility: Secondary | ICD-10-CM

## 2016-04-04 NOTE — Telephone Encounter (Signed)
I called patient. Laura Huerta has refused the referral for second opinion regarding the multiple falling episodes. The patient herself had requested a second opinion.  I called her regarding this issue, she still wishes to have a second opinion, I will refer her to [facility].

## 2016-04-04 NOTE — Telephone Encounter (Signed)
Patient is aware of liver function being normal. Patient is also aware of cholesterol levels improving and being advised to continue taking the atorvastatin as well as limiting the intake of fried foods, whole milk and red meats. Patient advised to implement some regular exercise and to FU in 3 months for a recheck. Medical Assistant left message on patient's home and cell voicemail. Voicemail states to give a call back to [place] with [facility] at [phone].

## 2016-04-04 NOTE — Telephone Encounter (Signed)
Laura Huerta called from [facility] Neurology review board has reviewed records and patient has been denied for scheduling. Dr. Jannifer Franklin if he would like to appeal Dr. Jannifer Franklin would need to e-mail chief medical officer at [email or web address].  Thanks Hinton Dyer

## 2016-04-04 NOTE — Telephone Encounter (Signed)
-----   Message from Alfonse Spruce, [place] sent at 03/30/2016  1:09 PM EST ----- Liver function normal Lipid levels have improved. Continue to take your atorvastatin. Limit your intake of fried foods, red meats, and whole milk. Begin exercising at least 3-5 times per week for 30 minutes.  Follow up in 3 months.

## 2016-04-05 ENCOUNTER — Emergency Department (HOSPITAL_COMMUNITY)
Admission: EM | Admit: 2016-04-05 | Discharge: 2016-04-05 | Disposition: A | Discharge status: Home / Self Care | Payer: Medicaid Other | Attending: Emergency Medicine | Admitting: Emergency Medicine

## 2016-04-05 ENCOUNTER — Telehealth: Payer: Self-pay | Admitting: Family Medicine

## 2016-04-05 ENCOUNTER — Encounter (HOSPITAL_COMMUNITY): Payer: Self-pay | Admitting: Emergency Medicine

## 2016-04-05 DIAGNOSIS — F17210 Nicotine dependence, cigarettes, uncomplicated: Secondary | ICD-10-CM | POA: Insufficient documentation

## 2016-04-05 DIAGNOSIS — I10 Essential (primary) hypertension: Secondary | ICD-10-CM | POA: Insufficient documentation

## 2016-04-05 DIAGNOSIS — R42 Dizziness and giddiness: Secondary | ICD-10-CM | POA: Diagnosis not present

## 2016-04-05 DIAGNOSIS — Z79899 Other long term (current) drug therapy: Secondary | ICD-10-CM | POA: Insufficient documentation

## 2016-04-05 LAB — CBC
HEMATOCRIT: 41.3 % (ref 36.0–46.0)
HEMOGLOBIN: 13.1 g/dL (ref 12.0–15.0)
MCH: 23.4 pg — AB (ref 26.0–34.0)
MCHC: 31.7 g/dL (ref 30.0–36.0)
MCV: 73.9 fL — ABNORMAL LOW (ref 78.0–100.0)
Platelets: 268 10*3/uL (ref 150–400)
RBC: 5.59 MIL/uL — ABNORMAL HIGH (ref 3.87–5.11)
RDW: 15.8 % — ABNORMAL HIGH (ref 11.5–15.5)
WBC: 7.5 10*3/uL (ref 4.0–10.5)

## 2016-04-05 LAB — COMPREHENSIVE METABOLIC PANEL
ALBUMIN: 3.9 g/dL (ref 3.5–5.0)
ALT: 10 U/L — ABNORMAL LOW (ref 14–54)
AST: 16 U/L (ref 15–41)
Alkaline Phosphatase: 69 U/L (ref 38–126)
Anion gap: 11 (ref 5–15)
BUN: 6 mg/dL (ref 6–20)
CO2: 25 mmol/L (ref 22–32)
Calcium: 9.7 mg/dL (ref 8.9–10.3)
Chloride: 104 mmol/L (ref 101–111)
Creatinine, Ser: 0.8 mg/dL (ref 0.44–1.00)
GFR calc Af Amer: >60 mL/min (ref >60)
GFR calc non Af Amer: >60 mL/min (ref >60)
GLUCOSE: 108 mg/dL — AB (ref 65–99)
POTASSIUM: 3.9 mmol/L (ref 3.5–5.1)
SODIUM: 140 mmol/L (ref 135–145)
Total Bilirubin: 0.3 mg/dL (ref 0.3–1.2)
Total Protein: 7 g/dL (ref 6.5–8.1)

## 2016-04-05 LAB — LIPASE, BLOOD: Lipase: 18 U/L (ref 11–51)

## 2016-04-05 MED ORDER — ONDANSETRON 4 MG PO TBDP: 4.0000 mg | ORAL_TABLET | Freq: Three times a day (TID) | ORAL | 0 refills | Status: DC | PRN

## 2016-04-05 MED ORDER — MECLIZINE HCL 25 MG PO TABS: 25.0000 mg | ORAL_TABLET | Freq: Three times a day (TID) | ORAL | 0 refills | Status: DC | PRN

## 2016-04-05 NOTE — Telephone Encounter (Signed)
Returned patient's call to f/u with her. She states she is already in the emergency room and they have gotten blood work. She states she went because she was scared, began to get dizzy and vomiting. Pt encouraged to f/u after visit.

## 2016-04-05 NOTE — Telephone Encounter (Signed)
Referral has been sent to [facility] telephone [phone] Fax [phone] . Patient apt June 28 th arrive at 2:00 pm . Patient will Dr. Kyra Searles. Called patient she is on there CX list for sooner apt.

## 2016-04-05 NOTE — ED Provider Notes (Deleted)
51 year old female had sudden onset of vertigo-spinning sensation with associated nausea and vomiting. Symptoms have resolved. On exam, she has no nystagmus, but dizziness is reproduced by passive head movement. Lungs are clear and heart has regular rate and rhythm. Neurologic exam is normal. This appears to be an episode of peripheral vertigo and will be treated with meclizine on an as needed basis.  I saw and evaluated the patient, reviewed the resident's note and I agree with the findings and plan.     Delora Fuel, MD [id number]

## 2016-04-05 NOTE — ED Triage Notes (Signed)
Pt sts dizziness starting while in the shower where the room was spinning and nausea

## 2016-04-05 NOTE — Telephone Encounter (Signed)
Patient has been experiencing light headiness and nausea today for over an hour,....  Please advised.

## 2016-04-05 NOTE — Telephone Encounter (Signed)
Okay thank you for routing me.

## 2016-04-05 NOTE — ED Provider Notes (Signed)
[facility] DEPT Provider Note   CSN: [id number] Arrival date & time: 04/05/16  1401     History   Chief Complaint Chief Complaint  Patient presents with  . Dizziness    HPI Laura Huerta is a 51 y.o. female. Patient presents after an episode of dizziness, nausea, and vomiting earlier today. She reports she got out of the shower when she got out of the shower she felt like the room was spinning. Several minutes later, she began having nausea and one episode of clear liquid emesis. She lied down in her bed and after about 40 minutes, the symptoms resolved. She's never had any symptoms like this previously. She does have a history of chronic gait instability which she states has not been an issue the last few days. She denies any recent head injury. She denies any abdominal pain. Her dizziness, nausea, and vomiting have all resolved. She's had no fever or chills. No tinnitus. She does endorse several months of changes to her vision and hearing which she is being followed by her PCP for. No changes to her vision or hearing today.  HPI  Past Medical History:  Diagnosis Date  . Abnormality of gait 11/18/2014  . Acid reflux   . Anxiety   . Bronchitis   . Depression   . Hyperlipidemia   . Hypertension     Patient Active Problem List   Diagnosis Date Noted  . Hypertension 03/23/2016  . Hyperlipidemia 03/23/2016  . Depression 03/23/2016  . Shingles 03/12/2015  . Left lower lobe pneumonia ([place]) 02/04/2015  . Abnormality of gait 11/18/2014  . Rectal pain 09/18/2013  . Internal hemorrhoids 06/05/2013  . Hemorrhage of rectum and anus 04/22/2013  . Personal history of colonic polyps 04/22/2013  . Abnormal EKG 10/10/2012  . CA IN SITU, BREAST 11/30/2006  . TOBACCO ABUSE 11/30/2006  . COCAINE ABUSE 11/30/2006  . GERD (gastroesophageal reflux disease) 11/30/2006  . TENOSYNOVITIS, HAND/WRIST [place] 11/30/2006    Past Surgical History:  Procedure Laterality Date  . CESAREAN  SECTION    . HEMORRHOID BANDING  2015  . TUBAL LIGATION      OB History    Gravida Para Term Preterm AB Living   7 2 2   5 2    SAB TAB Ectopic Multiple Live Births     5             Home Medications    Prior to Admission medications   Medication Sig Start Date End Date Taking? Authorizing Provider  atorvastatin (LIPITOR) 10 MG tablet Take 1 tablet (10 mg total) by mouth daily. 01/03/16   Alfonse Spruce, FNP  benzonatate (TESSALON) 100 MG capsule Take 1 capsule (100 mg total) by mouth 3 (three) times daily as needed for cough. 03/16/16   Alfonse Spruce, FNP  buPROPion (WELLBUTRIN SR) 150 MG 12 hr tablet Take 1 tablet (150 mg total) by mouth 2 (two) times daily. 03/23/16   Alfonse Spruce, FNP  fluticasone (FLONASE) 50 MCG/ACT nasal spray Place 2 sprays into both nostrils daily. For 7 day. 03/16/16   Alfonse Spruce, FNP  lisinopril (PRINIVIL,ZESTRIL) 10 MG tablet Take 1 tablet (10 mg total) by mouth daily. 03/23/16   Alfonse Spruce, FNP  meclizine (ANTIVERT) 25 MG tablet Take 1 tablet (25 mg total) by mouth 3 (three) times daily as needed for dizziness. 04/05/16   Clifton James, MD  nicotine (NICODERM CQ - DOSED IN MG/24 HOURS) 21 mg/24hr patch Place 1 patch (  21 mg total) onto the skin daily. 03/23/16   Alfonse Spruce, FNP  omeprazole (PRILOSEC) 40 MG capsule Take 1 capsule (40 mg total) by mouth 2 (two) times daily. 01/03/16   Alfonse Spruce, FNP  ondansetron (ZOFRAN ODT) 4 MG disintegrating tablet Take 1 tablet (4 mg total) by mouth every 8 (eight) hours as needed for nausea or vomiting. 04/05/16   Clifton James, MD  oseltamivir (TAMIFLU) 75 MG capsule Take 1 capsule (75 mg total) by mouth 2 (two) times daily. 03/16/16   Alfonse Spruce, FNP  phenol (CHLORASEPTIC) 1.4 % LIQD Use as directed 1 spray in the mouth or throat as needed for throat irritation / pain. Patient not taking: Reported on 03/23/2016 03/16/16   Alfonse Spruce, FNP  polyethylene  glycol powder (GLYCOLAX/MIRALAX) powder Take 17 g by mouth 2 (two) times daily as needed for mild constipation. Patient not taking: Reported on 03/23/2016 01/03/16   Alfonse Spruce, FNP  traZODone (DESYREL) 50 MG tablet Take 1 tablet (50 mg total) by mouth at bedtime as needed for sleep. 03/23/16   Alfonse Spruce, FNP    Family History Family History  Problem Relation Age of Onset  . Hypertension Mother   . Heart failure Mother     Died age 58  . COPD Mother   . Asthma Mother   . Diabetes Mother   . Depression Mother   . Hypertension Father   . Diabetes Father   . Breast cancer Maternal Aunt     Social History Social History  Substance Use Topics  . Smoking status: Current Every Day Smoker    Packs/day: 2.00    Years: 10.00    Types: Cigarettes  . Smokeless tobacco: Never Used  . Alcohol use 0.0 oz/week     Comment: 40-60 oz. per week     Allergies   Patient has no known allergies.   Review of Systems Review of Systems  Constitutional: Negative for chills and fever.  HENT: Negative for ear pain and sore throat.   Eyes: Negative for pain and visual disturbance.  Respiratory: Negative for cough and shortness of breath.   Cardiovascular: Negative for chest pain and palpitations.  Gastrointestinal: Positive for nausea and vomiting. Negative for abdominal pain, constipation and diarrhea.  Genitourinary: Negative for dysuria and hematuria.  Musculoskeletal: Negative for arthralgias and back pain.  Skin: Negative for color change and rash.  Neurological: Positive for dizziness. Negative for seizures and syncope.  All other systems reviewed and are negative.    Physical Exam Updated Vital Signs BP 157/91   Pulse 81   Temp 98 F (36.7 C) (Oral)   Resp 16   LMP 05/11/2011   SpO2 96%   Physical Exam  Constitutional: She is oriented to person, place, and time. She appears well-developed and well-nourished. No distress.  HENT:  Head: Normocephalic and  atraumatic.  Eyes: Conjunctivae are normal.  Neck: Neck supple.  Cardiovascular: Normal rate and regular rhythm.   No murmur heard. Pulmonary/Chest: Effort normal and breath sounds normal. No respiratory distress.  Abdominal: Soft. There is no tenderness.  Musculoskeletal: She exhibits no edema, tenderness or deformity.  Neurological: She is alert and oriented to person, place, and time. She displays normal reflexes. No cranial nerve deficit or sensory deficit. She exhibits normal muscle tone. Coordination normal.  5/5 strength in all extremity. Sensation to light touch is intact throughout. She has no cranial nerve deficit. Normal finger to nose and normal heel-to-shin  testing. She ambulates without difficulty. No nystagmus.  Skin: Skin is warm and dry.  Psychiatric: She has a normal mood and affect.  Nursing note and vitals reviewed.    ED Treatments / Results  Labs (all labs ordered are listed, but only abnormal results are displayed) Labs Reviewed  COMPREHENSIVE METABOLIC PANEL - Abnormal; Notable for the following:       Result Value   Glucose, Bld 108 (*)    ALT 10 (*)    All other components within normal limits  CBC - Abnormal; Notable for the following:    RBC 5.59 (*)    MCV 73.9 (*)    MCH 23.4 (*)    RDW 15.8 (*)    All other components within normal limits  LIPASE, BLOOD  URINALYSIS, ROUTINE W REFLEX MICROSCOPIC    EKG  EKG Interpretation None       Radiology No results found.  Procedures Procedures (including critical care time)  Medications Ordered in ED Medications - No data to display   Initial Impression / Assessment and Plan / ED Course  I have reviewed the triage vital signs and the nursing notes.  Pertinent labs & imaging results that were available during my care of the patient were reviewed by me and considered in my medical decision making (see chart for details).    Patient is a 51 year old female with history as above who presents  with episode of dizziness, nausea, and vomiting. Her symptoms have since resolved. Her dizziness was described as room spinning. Episode lasted about 40 minutes. She has a normal neurological exam here. Her symptoms sound consistent with peripheral vertigo. No concern for central process at this time. Plan is for Meclizine and Zofran prn. Instruction on Epley maneuver given. Pt is ambulating w/o difficulty here. Plan is for PCP follow-up. Return precautions discussed in detail.  Final Clinical Impressions(s) / ED Diagnoses   Final diagnoses:  Vertigo    New Prescriptions New Prescriptions   MECLIZINE (ANTIVERT) 25 MG TABLET    Take 1 tablet (25 mg total) by mouth 3 (three) times daily as needed for dizziness.   ONDANSETRON (ZOFRAN ODT) 4 MG DISINTEGRATING TABLET    Take 1 tablet (4 mg total) by mouth every 8 (eight) hours as needed for nausea or vomiting.     Clifton James, MD [id number]    Delora Fuel, MD [id number]

## 2016-04-10 ENCOUNTER — Ambulatory Visit: Payer: Medicaid Other | Attending: Family Medicine | Admitting: Family Medicine

## 2016-04-10 VITALS — BP 119/72 | HR 80 | Temp 97.6°F | Resp 18 | Ht 65.0 in | Wt 151.8 lb

## 2016-04-10 DIAGNOSIS — Z79899 Other long term (current) drug therapy: Secondary | ICD-10-CM | POA: Insufficient documentation

## 2016-04-10 DIAGNOSIS — H9313 Tinnitus, bilateral: Secondary | ICD-10-CM | POA: Diagnosis not present

## 2016-04-10 DIAGNOSIS — R42 Dizziness and giddiness: Secondary | ICD-10-CM | POA: Diagnosis not present

## 2016-04-10 MED ORDER — MECLIZINE HCL 25 MG PO TABS: 25.0000 mg | ORAL_TABLET | Freq: Three times a day (TID) | ORAL | 0 refills | Status: DC | PRN

## 2016-04-10 NOTE — Patient Instructions (Signed)

## 2016-04-10 NOTE — Progress Notes (Signed)
Subjective:  Patient ID: Laura Huerta, female    DOB: Oct 08, 1965  Age: 51 y.o. MRN: [id number]  CC: Tinnitus   HPI Nakeema Leite presents for   Tinnitus: Left ear tinnitus intermittent for 3 days. Denies any significant history of ear injury. Denies fevers or ear drainage. Reports decreased hearing in left ear in 2 years.   Dizziness: 2 year history of chronic headaches with dizziness. Denies any significant history of head or ear injury. Denies any N/V, diplopia, or gait problems. She reports upcoming neurologist appointment.   Outpatient Medications Prior to Visit  Medication Sig Dispense Refill  . atorvastatin (LIPITOR) 10 MG tablet Take 1 tablet (10 mg total) by mouth daily. 90 tablet 3  . benzonatate (TESSALON) 100 MG capsule Take 1 capsule (100 mg total) by mouth 3 (three) times daily as needed for cough. 30 capsule 0  . buPROPion (WELLBUTRIN SR) 150 MG 12 hr tablet Take 1 tablet (150 mg total) by mouth 2 (two) times daily. 60 tablet 2  . fluticasone (FLONASE) 50 MCG/ACT nasal spray Place 2 sprays into both nostrils daily. For 7 day. 16 g 0  . lisinopril (PRINIVIL,ZESTRIL) 10 MG tablet Take 1 tablet (10 mg total) by mouth daily. 30 tablet 2  . meclizine (ANTIVERT) 25 MG tablet Take 1 tablet (25 mg total) by mouth 3 (three) times daily as needed for dizziness. 30 tablet 0  . nicotine (NICODERM CQ - DOSED IN MG/24 HOURS) 21 mg/24hr patch Place 1 patch (21 mg total) onto the skin daily. 28 patch 1  . omeprazole (PRILOSEC) 40 MG capsule Take 1 capsule (40 mg total) by mouth 2 (two) times daily. 60 capsule 1  . ondansetron (ZOFRAN ODT) 4 MG disintegrating tablet Take 1 tablet (4 mg total) by mouth every 8 (eight) hours as needed for nausea or vomiting. 20 tablet 0  . oseltamivir (TAMIFLU) 75 MG capsule Take 1 capsule (75 mg total) by mouth 2 (two) times daily. 10 capsule 0  . phenol (CHLORASEPTIC) 1.4 % LIQD Use as directed 1 spray in the mouth or throat as needed for throat  irritation / pain. (Patient not taking: Reported on 03/23/2016)  0  . polyethylene glycol powder (GLYCOLAX/MIRALAX) powder Take 17 g by mouth 2 (two) times daily as needed for mild constipation. (Patient not taking: Reported on 03/23/2016) 255 g 0  . traZODone (DESYREL) 50 MG tablet Take 1 tablet (50 mg total) by mouth at bedtime as needed for sleep. 30 tablet 2   No facility-administered medications prior to visit.     ROS Review of Systems  Constitutional: Negative.   HENT: Positive for hearing loss and tinnitus.   Respiratory: Negative.   Cardiovascular: Negative.   Gastrointestinal: Negative.   Neurological: Positive for dizziness.    Objective:  BP 119/72 (BP Location: Left Arm, Patient Position: Sitting, Cuff Size: Normal)   Pulse 80   Temp 97.6 F (36.4 C) (Oral)   Resp 18   Ht 5\' 5"  (1.651 m)   Wt 151 lb 12.8 oz (68.9 kg)   LMP 05/11/2011   SpO2 97%   BMI 25.26 kg/m   BP/Weight 04/10/2016 04/05/2016 [id number]  Systolic BP 123456 [id number]  Diastolic BP 72 91 74  Wt. (Lbs) 151.8 - 154.2  BMI 25.26 - 26.47     Physical Exam  Constitutional: She appears well-developed and well-nourished.  HENT:  Head: Normocephalic and atraumatic.  Right Ear: Tympanic membrane, external ear and ear canal normal.  Left Ear: Tympanic membrane,  external ear and ear canal normal. Decreased hearing is noted.  Eyes: Conjunctivae are normal. Pupils are equal, round, and reactive to light.  Neck: No JVD present.  Cardiovascular: Normal rate, regular rhythm, normal heart sounds and intact distal pulses.   Pulmonary/Chest: Effort normal and breath sounds normal.  Abdominal: Soft. Bowel sounds are normal.  Skin: Skin is warm and dry.  Nursing note and vitals reviewed.   Assessment & Plan:   Problem List Items Addressed This Visit    None    Visit Diagnoses    Vertigo    -  Primary   Relevant Medications   meclizine (ANTIVERT) 25 MG tablet   Other Relevant Orders   Ambulatory referral to  Audiology   Tinnitus of both ears       Relevant Orders   Ambulatory referral to Audiology      Meds ordered this encounter  Medications  . meclizine (ANTIVERT) 25 MG tablet    Sig: Take 1 tablet (25 mg total) by mouth 3 (three) times daily as needed for dizziness or nausea.    Dispense:  30 tablet    Refill:  0    Order Specific Question:   Supervising Provider    Answer:   Tresa Garter [id number]    Follow-up: Return if symptoms worsen or fail to improve.   Alfonse Spruce FNP

## 2016-04-10 NOTE — Progress Notes (Signed)
Patient is here for physical  Patient has no other concerns  Patient denies pain today  Patient has eaten today & has taking her meds today

## 2016-04-21 ENCOUNTER — Other Ambulatory Visit: Payer: Self-pay | Admitting: Family Medicine

## 2016-05-17 ENCOUNTER — Ambulatory Visit
Admission: RE | Admit: 2016-05-17 | Discharge: 2016-05-17 | Disposition: A | Discharge status: Home / Self Care | Payer: Medicaid Other | Source: Ambulatory Visit | Attending: Family Medicine | Admitting: Family Medicine

## 2016-05-17 DIAGNOSIS — Z1239 Encounter for other screening for malignant neoplasm of breast: Secondary | ICD-10-CM

## 2016-06-05 ENCOUNTER — Other Ambulatory Visit: Payer: Self-pay | Admitting: Internal Medicine

## 2016-06-21 ENCOUNTER — Ambulatory Visit: Payer: Medicaid Other | Admitting: Family Medicine

## 2016-07-03 ENCOUNTER — Emergency Department (HOSPITAL_COMMUNITY)
Admission: EM | Admit: 2016-07-03 | Discharge: 2016-07-04 | Disposition: A | Discharge status: Other Acute Inpatient Hospital | Payer: Medicaid Other | Attending: Emergency Medicine | Admitting: Emergency Medicine

## 2016-07-03 ENCOUNTER — Encounter (HOSPITAL_COMMUNITY): Payer: Self-pay | Admitting: *Deleted

## 2016-07-03 DIAGNOSIS — T43212A Poisoning by selective serotonin and norepinephrine reuptake inhibitors, intentional self-harm, initial encounter: Secondary | ICD-10-CM | POA: Diagnosis not present

## 2016-07-03 DIAGNOSIS — I10 Essential (primary) hypertension: Secondary | ICD-10-CM | POA: Diagnosis not present

## 2016-07-03 DIAGNOSIS — R45851 Suicidal ideations: Secondary | ICD-10-CM

## 2016-07-03 DIAGNOSIS — T50902A Poisoning by unspecified drugs, medicaments and biological substances, intentional self-harm, initial encounter: Secondary | ICD-10-CM

## 2016-07-03 DIAGNOSIS — Z79899 Other long term (current) drug therapy: Secondary | ICD-10-CM | POA: Insufficient documentation

## 2016-07-03 DIAGNOSIS — F17210 Nicotine dependence, cigarettes, uncomplicated: Secondary | ICD-10-CM | POA: Diagnosis not present

## 2016-07-03 LAB — COMPREHENSIVE METABOLIC PANEL
ALBUMIN: 3.9 g/dL (ref 3.5–5.0)
ALK PHOS: 71 U/L (ref 38–126)
ALT: 8 U/L — AB (ref 14–54)
ANION GAP: 9 (ref 5–15)
AST: 18 U/L (ref 15–41)
BUN: 8 mg/dL (ref 6–20)
CALCIUM: 9.4 mg/dL (ref 8.9–10.3)
CHLORIDE: 107 mmol/L (ref 101–111)
CO2: 24 mmol/L (ref 22–32)
CREATININE: 0.88 mg/dL (ref 0.44–1.00)
GFR calc Af Amer: >60 mL/min (ref >60)
GFR calc non Af Amer: >60 mL/min (ref >60)
GLUCOSE: 129 mg/dL — AB (ref 65–99)
Potassium: 3.5 mmol/L (ref 3.5–5.1)
SODIUM: 140 mmol/L (ref 135–145)
Total Bilirubin: 0.4 mg/dL (ref 0.3–1.2)
Total Protein: 7 g/dL (ref 6.5–8.1)

## 2016-07-03 LAB — CBC
HEMATOCRIT: 41.2 % (ref 36.0–46.0)
HEMOGLOBIN: 13.2 g/dL (ref 12.0–15.0)
MCH: 23.7 pg — ABNORMAL LOW (ref 26.0–34.0)
MCHC: 32 g/dL (ref 30.0–36.0)
MCV: 73.8 fL — ABNORMAL LOW (ref 78.0–100.0)
Platelets: 264 10*3/uL (ref 150–400)
RBC: 5.58 MIL/uL — AB (ref 3.87–5.11)
RDW: 14.8 % (ref 11.5–15.5)
WBC: 10.2 10*3/uL (ref 4.0–10.5)

## 2016-07-03 LAB — SALICYLATE LEVEL: Salicylate Lvl: <7 mg/dL (ref 2.8–30.0)

## 2016-07-03 LAB — MAGNESIUM: MAGNESIUM: 2.1 mg/dL (ref 1.7–2.4)

## 2016-07-03 LAB — ETHANOL: Alcohol, Ethyl (B): <5 mg/dL (ref <5)

## 2016-07-03 LAB — ACETAMINOPHEN LEVEL

## 2016-07-03 MED ORDER — IBUPROFEN 400 MG PO TABS: 600.0000 mg | ORAL_TABLET | Freq: Three times a day (TID) | ORAL | Status: DC | PRN

## 2016-07-03 MED ORDER — ONDANSETRON HCL 4 MG PO TABS: 4.0000 mg | ORAL_TABLET | Freq: Three times a day (TID) | ORAL | Status: DC | PRN

## 2016-07-03 MED ORDER — ACETAMINOPHEN 325 MG PO TABS: 650.0000 mg | ORAL_TABLET | ORAL | Status: DC | PRN

## 2016-07-03 MED ORDER — NICOTINE 21 MG/24HR TD PT24
21.0000 mg | MEDICATED_PATCH | Freq: Every day | TRANSDERMAL | Status: DC
  Filled 2016-07-03: qty 1

## 2016-07-03 MED ORDER — LISINOPRIL 10 MG PO TABS
10.0000 mg | ORAL_TABLET | Freq: Every day | ORAL | Status: DC
  Administered 2016-07-04: 10 mg via ORAL
  Filled 2016-07-03: qty 1

## 2016-07-03 MED ORDER — BUPROPION HCL ER (SR) 150 MG PO TB12
150.0000 mg | ORAL_TABLET | Freq: Two times a day (BID) | ORAL | Status: DC
  Administered 2016-07-04: 150 mg via ORAL
  Filled 2016-07-03 (×5): qty 1

## 2016-07-03 MED ORDER — ZOLPIDEM TARTRATE 5 MG PO TABS
5.0000 mg | ORAL_TABLET | Freq: Every evening | ORAL | Status: DC | PRN
  Administered 2016-07-03: 5 mg via ORAL
  Filled 2016-07-03: qty 1

## 2016-07-03 MED ORDER — ATORVASTATIN CALCIUM 10 MG PO TABS
10.0000 mg | ORAL_TABLET | Freq: Every day | ORAL | Status: DC
  Administered 2016-07-04: 10 mg via ORAL
  Filled 2016-07-03: qty 1

## 2016-07-03 NOTE — ED Provider Notes (Signed)
[facility] DEPT Provider Note   CSN: [id number] Arrival date & time: 07/03/16  1655     History   Chief Complaint Chief Complaint  Patient presents with  . Suicidal  . Drug Overdose    HPI Laura Huerta is a 51 y.o. female.  HPI Pt reports attempt to overdose on Trazadone to kill herself. No symptoms at this time. Denies headache.  No chest pain.  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  She also reports increasing financial constraints at home.  Symptoms are moderate in severity.  No hallucinations  Past Medical History:  Diagnosis Date  . Abnormality of gait 11/18/2014  . Acid reflux   . Anxiety   . Bronchitis   . Depression   . Hyperlipidemia   . Hypertension     Patient Active Problem List   Diagnosis Date Noted  . Hypertension 03/23/2016  . Hyperlipidemia 03/23/2016  . Depression 03/23/2016  . Shingles 03/12/2015  . Left lower lobe pneumonia ([place]) 02/04/2015  . Abnormality of gait 11/18/2014  . Rectal pain 09/18/2013  . Internal hemorrhoids 06/05/2013  . Hemorrhage of rectum and anus 04/22/2013  . Personal history of colonic polyps 04/22/2013  . Abnormal EKG 10/10/2012  . CA IN SITU, BREAST 11/30/2006  . TOBACCO ABUSE 11/30/2006  . COCAINE ABUSE 11/30/2006  . GERD (gastroesophageal reflux disease) 11/30/2006  . TENOSYNOVITIS, HAND/WRIST [place] 11/30/2006    Past Surgical History:  Procedure Laterality Date  . BREAST EXCISIONAL BIOPSY Left 11/02/2003  . CESAREAN SECTION    . HEMORRHOID BANDING  2015  . TUBAL LIGATION      OB History    Gravida Para Term Preterm AB Living   7 2 2   5 2    SAB TAB Ectopic Multiple Live Births     5             Home Medications    Prior to Admission medications   Medication Sig Start Date End Date Taking? Authorizing Provider  atorvastatin (LIPITOR) 10 MG tablet Take 1 tablet (10 mg total) by mouth daily. 01/03/16  Yes  Hairston, Maylon Peppers, FNP  buPROPion (WELLBUTRIN SR) 150 MG 12 hr tablet Take 1 tablet (150 mg total) by mouth 2 (two) times daily. 03/23/16  Yes Hairston, Mandesia R, FNP  fluticasone (FLONASE) 50 MCG/ACT nasal spray Place 2 sprays into both nostrils daily. For 7 day. Patient taking differently: Place 2 sprays into both nostrils daily as needed for allergies. For 7 day. 03/16/16  Yes Hairston, Mandesia R, FNP  lisinopril (PRINIVIL,ZESTRIL) 10 MG tablet Take 1 tablet (10 mg total) by mouth daily. 03/23/16  Yes Hairston, Maylon Peppers, FNP  meclizine (ANTIVERT) 25 MG tablet Take 1 tablet (25 mg total) by mouth 3 (three) times daily as needed for dizziness or nausea. 04/10/16  Yes Hairston, Mandesia R, FNP  nicotine (NICODERM CQ - DOSED IN MG/24 HOURS) 21 mg/24hr patch Place 1 patch (21 mg total) onto the skin daily. 03/23/16  Yes Hairston, Maylon Peppers, FNP  omeprazole (PRILOSEC) 40 MG capsule Take 40 mg by mouth 2 (two) times daily. 05/03/16  Yes [provider]  polyethylene glycol powder (GLYCOLAX/MIRALAX) powder Take 17 g by mouth 2 (two) times daily as needed for mild constipation. Patient taking differently: Take 17 g by mouth 2 (two) times a week.  01/03/16  Yes Hairston, Maylon Peppers, FNP  traZODone (DESYREL) 50 MG tablet Take 1 tablet (50  mg total) by mouth at bedtime as needed for sleep. Patient taking differently: Take 50 mg by mouth at bedtime.  03/23/16  Yes Hairston, Maylon Peppers, FNP  benzonatate (TESSALON) 100 MG capsule Take 1 capsule (100 mg total) by mouth 3 (three) times daily as needed for cough. Patient not taking: Reported on 07/03/2016 03/16/16   Alfonse Spruce, FNP  meclizine (ANTIVERT) 25 MG tablet Take 1 tablet (25 mg total) by mouth 3 (three) times daily as needed for dizziness. Patient not taking: Reported on 07/03/2016 04/05/16   Clifton James, MD  omeprazole (PRILOSEC) 20 MG capsule Take 1 capsule (20 mg total) by mouth 2 (two) times daily. Patient not taking: Reported  on 07/03/2016 06/05/16   Alfonse Spruce, FNP  ondansetron (ZOFRAN ODT) 4 MG disintegrating tablet Take 1 tablet (4 mg total) by mouth every 8 (eight) hours as needed for nausea or vomiting. Patient not taking: Reported on 07/03/2016 04/05/16   Clifton James, MD  oseltamivir (TAMIFLU) 75 MG capsule Take 1 capsule (75 mg total) by mouth 2 (two) times daily. Patient not taking: Reported on 07/03/2016 03/16/16   Alfonse Spruce, FNP  phenol (CHLORASEPTIC) 1.4 % LIQD Use as directed 1 spray in the mouth or throat as needed for throat irritation / pain. Patient not taking: Reported on 07/03/2016 03/16/16   Alfonse Spruce, FNP    Family History Family History  Problem Relation Age of Onset  . Hypertension Mother   . Heart failure Mother        Died age 3  . COPD Mother   . Asthma Mother   . Diabetes Mother   . Depression Mother   . Hypertension Father   . Diabetes Father   . Breast cancer Maternal Aunt     Social History Social History  Substance Use Topics  . Smoking status: Current Every Day Smoker    Packs/day: 2.00    Years: 10.00    Types: Cigarettes  . Smokeless tobacco: Never Used  . Alcohol use 0.0 oz/week     Comment: 40-60 oz. per week     Allergies   Patient has no known allergies.   Review of Systems Review of Systems  All other systems reviewed and are negative.    Physical Exam Updated Vital Signs BP 133/79 (BP Location: Right Arm)   Pulse 78   Temp 97.5 F (36.4 C) (Oral)   Resp (!) 23   Ht 5\' 4"  (1.626 m)   Wt 72.6 kg (160 lb)   LMP 05/11/2011   SpO2 98%   BMI 27.46 kg/m   Physical Exam  Constitutional: She is oriented to person, place, and time. She appears well-developed and well-nourished. No distress.  HENT:  Head: Normocephalic and atraumatic.  Eyes: EOM are normal.  Neck: Normal range of motion.  Cardiovascular: Normal rate, regular rhythm and normal heart sounds.   Pulmonary/Chest: Effort normal and breath sounds  normal.  Abdominal: Soft. She exhibits no distension. There is no tenderness.  Musculoskeletal: Normal range of motion.  Neurological: She is alert and oriented to person, place, and time.  Skin: Skin is warm and dry.  Psychiatric:  Depressed, flat affect. suicidal  Nursing note and vitals reviewed.    ED Treatments / Results  Labs (all labs ordered are listed, but only abnormal results are displayed) Labs Reviewed  COMPREHENSIVE METABOLIC PANEL - Abnormal; Notable for the following:       Result Value   Glucose, Bld 129 (*)  ALT 8 (*)    All other components within normal limits  ACETAMINOPHEN LEVEL - Abnormal; Notable for the following:    Acetaminophen (Tylenol), Serum <10 (*)    All other components within normal limits  CBC - Abnormal; Notable for the following:    RBC 5.58 (*)    MCV 73.8 (*)    MCH 23.7 (*)    All other components within normal limits  ETHANOL  SALICYLATE LEVEL  MAGNESIUM  RAPID URINE DRUG SCREEN, HOSP PERFORMED  CBG MONITORING, ED    EKG  EKG Interpretation  Date/Time:  Monday Jul 03 2016 17:21:38 EDT Ventricular Rate:  79 PR Interval:  148 QRS Duration: 80 QT Interval:  430 QTC Calculation: 493 R Axis:   53 Text Interpretation:  Normal sinus rhythm Possible Inferior infarct , age undetermined Anterior infarct , age undetermined Abnormal ECG No significant change was found Confirmed by Jola Schmidt [phone]) on 07/03/2016 6:35:57 PM       Radiology No results found.  Procedures Procedures (including critical care time)  Medications Ordered in ED Medications - No data to display   Initial Impression / Assessment and Plan / ED Course  I have reviewed the triage vital signs and the nursing notes.  Pertinent labs & imaging results that were available during my care of the patient were reviewed by me and considered in my medical decision making (see chart for details).     Patient observed in the emergency department.  No signs of  toxicity.  Medically clear.  No seizures.  No bradycardia.  Left leg is normal.  No prolonged QT  Final Clinical Impressions(s) / ED Diagnoses   Final diagnoses:  None    New Prescriptions New Prescriptions   No medications on file     Jola Schmidt, MD 07/03/16 [id number]

## 2016-07-03 NOTE — ED Notes (Signed)
ED Provider at bedside. 

## 2016-07-03 NOTE — ED Notes (Addendum)
Poison Control stated to watch pt for 6 hours from arrival.  Possible problems and solutions: (1) Low K and Mag - replace (2) Seizures - tx with ativan or valium (3) Bradycardia/hypotension - tx with fluids (4) Elevated QTC - monitor

## 2016-07-03 NOTE — BH Assessment (Addendum)
Tele Assessment Note   Laura Huerta is an 51 y.o. female voluntarily reported to ED due to suicidal attempt.  Pt attempted suicide by taking an excessive amount of Trazadone.  Pt denies HI. Pt sts she is depressed about not being able to get a job and not being able to read.  Pt denies AVH, however, did express a plan on how she might harm herself.  Pt denies having a therapist or psychiatrist.    Pt sts she lives with her son.  Pt sts she can return home once she is discharged. Pt denies current abuse of SA. Pt denies legal issus or upcoming court appointments.  Pt denies having any anger or violent history.  Pt presented in hospital scrubs with an unremarkable appearance.  Pt had fair eye contact with coherent speech.  Pt was alert and cooperative.  Pt mood was sad and irritable. Her affect was congruent with her mood. Pt thought process was coherent; however, her judgment was impaired.  Pt was 4 x's oriented to people, place, time, and situation.  Pt meets criteria and is recommended for Inpatient  Treatment Services per Patriciaann Clan, NP.     Diagnosis: Major Depressive Disorder  Past Medical History:  Past Medical History:  Diagnosis Date  . Abnormality of gait 11/18/2014  . Acid reflux   . Anxiety   . Bronchitis   . Depression   . Hyperlipidemia   . Hypertension     Past Surgical History:  Procedure Laterality Date  . BREAST EXCISIONAL BIOPSY Left 11/02/2003  . CESAREAN SECTION    . HEMORRHOID BANDING  2015  . TUBAL LIGATION      Family History:  Family History  Problem Relation Age of Onset  . Hypertension Mother   . Heart failure Mother        Died age 40  . COPD Mother   . Asthma Mother   . Diabetes Mother   . Depression Mother   . Hypertension Father   . Diabetes Father   . Breast cancer Maternal Aunt     Social History:  reports that she has been smoking Cigarettes.  She has a 20.00 pack-year smoking history. She has never used smokeless tobacco. She  reports that she drinks alcohol. She reports that she does not use drugs.  Additional Social History:  Alcohol / Drug Use Pain Medications: See MAR Prescriptions: See MAR Over the Counter: See MAR History of alcohol / drug use?: No history of alcohol / drug abuse  CIWA: CIWA-Ar BP: 128/79 Pulse Rate: 92 COWS:    PATIENT STRENGTHS: (choose at least two) Ability for insight Active sense of humor Average or above average intelligence Communication skills  Allergies: No Known Allergies  Home Medications:  (Not in a hospital admission)  OB/GYN Status:  Patient's last menstrual period was 05/11/2011.  General Assessment Data Location of Assessment: [facility] ED TTS Assessment: In system Is this a Tele or Face-to-Face Assessment?: Tele Assessment Is this an Initial Assessment or a Re-assessment for this encounter?: Initial Assessment Marital status: Single Is patient pregnant?: No Pregnancy Status: No Living Arrangements: Children Can pt return to current living arrangement?: Yes Admission Status: Voluntary Is patient capable of signing voluntary admission?: Yes Referral Source: Self/Family/Friend Insurance type: Medicaid     Crisis Care Plan Living Arrangements: Children Legal Guardian: Other: (SElf) Name of Psychiatrist: None reported Name of Therapist: None reported  Education Status Is patient currently in school?: No Current Grade: n Highest grade of school patient  has completed: 7th  Risk to self with the past 6 months Has patient been a risk to self within the past 6 months prior to admission? : Yes Suicidal Intent: No-Not Currently/Within Last 6 Months Has patient had any suicidal intent within the past 6 months prior to admission? : Yes Is patient at risk for suicide?: Yes Suicidal Plan?: No-Not Currently/Within Last 6 Months Has patient had any suicidal plan within the past 6 months prior to admission? : Yes Access to Means: No What has been your use of  drugs/alcohol within the last 12 months?: Pt sts alcohol, however, denies abusing alcohol Previous Attempts/Gestures: Yes How many times?: 3 Other Self Harm Risks: Pt denies Triggers for Past Attempts: Other (Comment) (Drugs) Intentional Self Injurious Behavior: None Family Suicide History: Yes (Pt sts her mother and brther attempted) Recent stressful life event(s): Other (Comment) (Pt sts she can't get a job or can read to get a job) Persecutory voices/beliefs?: Yes Depression: Yes Depression Symptoms: Feeling worthless/self pity, Tearfulness, Insomnia, Guilt, Feeling angry/irritable Substance abuse history and/or treatment for substance abuse?: Yes Suicide prevention information given to non-admitted patients: Not applicable  Risk to Others within the past 6 months Homicidal Ideation: No Does patient have any lifetime risk of violence toward others beyond the six months prior to admission? : No Thoughts of Harm to Others: No Current Homicidal Intent: No Current Homicidal Plan: No Access to Homicidal Means: No Identified Victim: NA History of harm to others?: No Assessment of Violence: None Noted Violent Behavior Description: None reported Does patient have access to weapons?: No Criminal Charges Pending?: No Does patient have a court date: No Is patient on probation?: No  Psychosis Hallucinations: None noted Delusions: None noted  Mental Status Report Appearance/Hygiene: Disheveled, In scrubs Eye Contact: Fair Motor Activity: Freedom of movement Speech: Logical/coherent Level of Consciousness: Alert Mood: Sad, Irritable Affect: Sad, Irritable Anxiety Level: Severe Thought Processes: Coherent Judgement: Impaired Orientation: Person, Place, Time, Situation Obsessive Compulsive Thoughts/Behaviors: None  Cognitive Functioning Concentration: Fair Memory: Recent Intact, Remote Intact, Recent Impaired IQ: Average Insight: Poor Impulse Control: Poor Appetite:  Good Weight Loss: 0 Weight Gain: 0 Sleep: No Change Total Hours of Sleep: 12 Vegetative Symptoms: None  ADLScreening [facility] Assessment Services) Patient's cognitive ability adequate to safely complete daily activities?: No Patient able to express need for assistance with ADLs?: Yes Independently performs ADLs?: Yes (appropriate for developmental age)  Prior Inpatient Therapy Prior Inpatient Therapy: Yes Prior Therapy Dates: Pt can't recall Prior Therapy Facilty/Provider(s): Pt can't recall Reason for Treatment: Drugs  Prior Outpatient Therapy Prior Outpatient Therapy: No Prior Therapy Dates: Pt cannot recall Prior Therapy Facilty/Provider(s): Pt cannot recall Reason for Treatment: Drugs Does patient have an ACCT team?: No Does patient have Intensive In-House Services?  : No Does patient have [facility] services? : No Does patient have P4CC services?: No  ADL Screening (condition at time of admission) Patient's cognitive ability adequate to safely complete daily activities?: No Patient able to express need for assistance with ADLs?: Yes Independently performs ADLs?: Yes (appropriate for developmental age)       Abuse/Neglect Assessment (Assessment to be complete while patient is alone) Physical Abuse: Denies Verbal Abuse: Denies Sexual Abuse: Denies Exploitation of patient/patient's resources: Denies Values / Beliefs Cultural Requests During Hospitalization: None Spiritual Requests During Hospitalization: None Consults Spiritual Care Consult Needed: No Social Work Consult Needed: No Regulatory affairs officer (For Healthcare) Does Patient Have a Medical Advance Directive?: No    Additional Information 1:1 In Past 12  Months?: Yes CIRT Risk: No Elopement Risk: No Does patient have medical clearance?: No     Disposition:  Disposition Initial Assessment Completed for this Encounter: Yes Disposition of Patient: Inpatient treatment program (Per Patriciaann Clan, NP) Type of  inpatient treatment program: Adult  [place] 07/03/2016 11:03 PM

## 2016-07-03 NOTE — ED Notes (Signed)
TTS in room at this time; pt talking to counselor.

## 2016-07-03 NOTE — ED Notes (Addendum)
Poison control called.  Pt placed in purple scrubs and wanded.

## 2016-07-03 NOTE — ED Notes (Signed)
Spoke with Ebony Hail from poison control for pt update of lab results and further recomendations. Per Ebony Hail pt will need the continued EKG monitoring until 2200 for any changes but she is cleared otherwise at this point.

## 2016-07-03 NOTE — ED Triage Notes (Signed)
Pt states she took 10 50 mg trazadone pills to kill herself at 1600.

## 2016-07-03 NOTE — ED Notes (Signed)
[facility] recommends inpatient treatment.

## 2016-07-04 ENCOUNTER — Encounter (HOSPITAL_COMMUNITY): Payer: Self-pay

## 2016-07-04 ENCOUNTER — Inpatient Hospital Stay (HOSPITAL_COMMUNITY)
Admission: AD | Admit: 2016-07-04 | Discharge: 2016-07-06 | DRG: 885 | Disposition: A | Discharge status: Home / Self Care | Payer: Medicaid Other | Source: Intra-hospital | Attending: Psychiatry | Admitting: Psychiatry

## 2016-07-04 DIAGNOSIS — T43212A Poisoning by selective serotonin and norepinephrine reuptake inhibitors, intentional self-harm, initial encounter: Secondary | ICD-10-CM | POA: Diagnosis present

## 2016-07-04 DIAGNOSIS — R45851 Suicidal ideations: Secondary | ICD-10-CM | POA: Diagnosis not present

## 2016-07-04 DIAGNOSIS — Z79899 Other long term (current) drug therapy: Secondary | ICD-10-CM

## 2016-07-04 DIAGNOSIS — F332 Major depressive disorder, recurrent severe without psychotic features: Principal | ICD-10-CM | POA: Diagnosis present

## 2016-07-04 DIAGNOSIS — R0981 Nasal congestion: Secondary | ICD-10-CM

## 2016-07-04 DIAGNOSIS — F17210 Nicotine dependence, cigarettes, uncomplicated: Secondary | ICD-10-CM | POA: Diagnosis not present

## 2016-07-04 DIAGNOSIS — Z818 Family history of other mental and behavioral disorders: Secondary | ICD-10-CM | POA: Diagnosis not present

## 2016-07-04 DIAGNOSIS — I10 Essential (primary) hypertension: Secondary | ICD-10-CM

## 2016-07-04 DIAGNOSIS — Y9289 Other specified places as the place of occurrence of the external cause: Secondary | ICD-10-CM | POA: Diagnosis not present

## 2016-07-04 DIAGNOSIS — F17200 Nicotine dependence, unspecified, uncomplicated: Secondary | ICD-10-CM

## 2016-07-04 MED ORDER — FLUOXETINE HCL 10 MG PO CAPS
10.0000 mg | ORAL_CAPSULE | Freq: Every day | ORAL | Status: DC
  Administered 2016-07-05: 10 mg via ORAL
  Filled 2016-07-04 (×3): qty 1

## 2016-07-04 MED ORDER — NICOTINE 21 MG/24HR TD PT24
21.0000 mg | MEDICATED_PATCH | Freq: Every day | TRANSDERMAL | Status: DC
  Administered 2016-07-05 – 2016-07-06 (×2): 21 mg via TRANSDERMAL
  Filled 2016-07-04 (×3): qty 1

## 2016-07-04 MED ORDER — MIRTAZAPINE 15 MG PO TBDP
15.0000 mg | ORAL_TABLET | Freq: Every evening | ORAL | Status: DC | PRN
  Administered 2016-07-04 (×2): 15 mg via ORAL
  Filled 2016-07-04 (×2): qty 1
  Filled 2016-07-04: qty 2
  Filled 2016-07-04 (×4): qty 1

## 2016-07-04 MED ORDER — LISINOPRIL 10 MG PO TABS
10.0000 mg | ORAL_TABLET | Freq: Every day | ORAL | Status: DC
  Administered 2016-07-05 – 2016-07-06 (×2): 10 mg via ORAL
  Filled 2016-07-04 (×3): qty 1

## 2016-07-04 MED ORDER — ACETAMINOPHEN 325 MG PO TABS: 650.0000 mg | ORAL_TABLET | Freq: Four times a day (QID) | ORAL | Status: DC | PRN

## 2016-07-04 MED ORDER — PANTOPRAZOLE SODIUM 40 MG PO TBEC
40.0000 mg | DELAYED_RELEASE_TABLET | Freq: Every day | ORAL | Status: DC
  Administered 2016-07-05 – 2016-07-06 (×2): 40 mg via ORAL
  Filled 2016-07-04 (×3): qty 1

## 2016-07-04 MED ORDER — POLYETHYLENE GLYCOL 3350 17 G PO PACK: 17.0000 g | PACK | Freq: Two times a day (BID) | ORAL | Status: DC | PRN

## 2016-07-04 MED ORDER — MAGNESIUM HYDROXIDE 400 MG/5ML PO SUSP: 30.0000 mL | Freq: Every day | ORAL | Status: DC | PRN

## 2016-07-04 MED ORDER — FLUTICASONE PROPIONATE 50 MCG/ACT NA SUSP
2.0000 | Freq: Every day | NASAL | Status: DC
  Filled 2016-07-04: qty 16

## 2016-07-04 MED ORDER — ALUM & MAG HYDROXIDE-SIMETH 200-200-20 MG/5ML PO SUSP: 30.0000 mL | ORAL | Status: DC | PRN

## 2016-07-04 MED ORDER — BUPROPION HCL ER (SR) 150 MG PO TB12
150.0000 mg | ORAL_TABLET | Freq: Two times a day (BID) | ORAL | Status: DC
  Administered 2016-07-04 – 2016-07-05 (×2): 150 mg via ORAL
  Filled 2016-07-04 (×7): qty 1

## 2016-07-04 MED ORDER — ATORVASTATIN CALCIUM 10 MG PO TABS
10.0000 mg | ORAL_TABLET | Freq: Every day | ORAL | Status: DC
  Administered 2016-07-05 – 2016-07-06 (×2): 10 mg via ORAL
  Filled 2016-07-04 (×3): qty 1

## 2016-07-04 NOTE — ED Notes (Addendum)
Pt's belongings placed in cabinet.  Valuables to security.  Pt also has a pill bottle labeled as zolpidem containing 4 whole pills and 3 half pills.  Will consult charge and Jacqlyn Larsen on what to do with the pills.

## 2016-07-04 NOTE — Progress Notes (Signed)
Pt accepted to [facility]. Bed 407-1.  Dr. Shea Evans accepting. Patient may arrive at 8:00 PM. AP Nurse, Vicente Males, notified.   Laura Huerta. Judi Cong, MSW, [facility] Work Disposition [phone]

## 2016-07-04 NOTE — ED Provider Notes (Signed)
Pt accepted at [facility] by Dr. Parke Poisson.  She is reexamined and remains stable for transfer.   Isla Pence, MD 07/04/16 Sharilyn Sites

## 2016-07-04 NOTE — ED Notes (Signed)
Two wanded family members visiting at bedside

## 2016-07-04 NOTE — ED Notes (Signed)
Patient given two coloring books from her belongings.

## 2016-07-04 NOTE — ED Notes (Signed)
Patient was given a snack and Drink, and A Regular Diet was Ordered for [employer]

## 2016-07-04 NOTE — Tx Team (Signed)
Initial Treatment Plan 07/04/2016 11:26 PM [place] Doreene Burke [id number]    PATIENT STRESSORS: Financial difficulties Occupational concerns   PATIENT STRENGTHS: Ability for insight Active sense of humor Average or above average intelligence Capable of independent living Communication skills Physical Health Supportive family/friends   PATIENT IDENTIFIED PROBLEMS: " I can't read and I can't get a job"  " I've had increased depression"  " I took 10 sleeping pills"                 DISCHARGE CRITERIA:  Ability to meet basic life and health needs Adequate post-discharge living arrangements Reduction of life-threatening or endangering symptoms to within safe limits  PRELIMINARY DISCHARGE PLAN: Outpatient therapy Return to previous living arrangement  PATIENT/FAMILY INVOLVEMENT: This treatment plan has been presented to and reviewed with the patient, Laura Huerta, and/or family member, .  The patient and family have been given the opportunity to ask questions and make suggestions.  Franciso Bend, RN 07/04/2016, 11:26 PM

## 2016-07-04 NOTE — ED Notes (Signed)
Report called to Garrett at [facility] [phone]). Pt can be received at 2000 per facility. Dr. Parke Poisson is accepting.

## 2016-07-04 NOTE — Progress Notes (Signed)
Patient ID: Laura Huerta, female   DOB: [birth year], 51 y.o.   MRN: [id number]  Patient is a 51 year old female patient that came in voluntary. Reports that she took 10 sleeping pills after becoming upset about "life in general". States that a stressor is that she cannot read and therefore cannot find a job. Was reluctant to say it was a suicide attempt. Currently denies any SI at present. Denies any HI. Reports having 6 c-sections and hemorrhoid surgery. Also reports HTN, Elevated cholesterol, depression, anxiety, and GERD. Reports living with son and grand son. States that she has never been inpatient here before. Pleasant and cooperative with process.

## 2016-07-05 ENCOUNTER — Encounter (HOSPITAL_COMMUNITY): Payer: Self-pay | Admitting: Psychiatry

## 2016-07-05 DIAGNOSIS — F17210 Nicotine dependence, cigarettes, uncomplicated: Secondary | ICD-10-CM

## 2016-07-05 DIAGNOSIS — F332 Major depressive disorder, recurrent severe without psychotic features: Principal | ICD-10-CM

## 2016-07-05 DIAGNOSIS — R45851 Suicidal ideations: Secondary | ICD-10-CM

## 2016-07-05 DIAGNOSIS — Z818 Family history of other mental and behavioral disorders: Secondary | ICD-10-CM

## 2016-07-05 MED ORDER — MIRTAZAPINE 15 MG PO TBDP
15.0000 mg | ORAL_TABLET | Freq: Every evening | ORAL | Status: DC | PRN
  Administered 2016-07-05: 15 mg via ORAL
  Filled 2016-07-05: qty 1
  Filled 2016-07-05 (×2): qty 14
  Filled 2016-07-05 (×3): qty 1

## 2016-07-05 MED ORDER — DOCUSATE SODIUM 100 MG PO CAPS
100.0000 mg | ORAL_CAPSULE | Freq: Two times a day (BID) | ORAL | Status: DC
  Administered 2016-07-05 – 2016-07-06 (×2): 100 mg via ORAL
  Filled 2016-07-05 (×5): qty 1

## 2016-07-05 MED ORDER — FLUOXETINE HCL 20 MG PO CAPS
20.0000 mg | ORAL_CAPSULE | Freq: Every day | ORAL | Status: DC
  Administered 2016-07-06: 20 mg via ORAL
  Filled 2016-07-05: qty 1
  Filled 2016-07-05: qty 7
  Filled 2016-07-05: qty 1

## 2016-07-05 MED ORDER — MIRTAZAPINE 15 MG PO TBDP
15.0000 mg | ORAL_TABLET | Freq: Every day | ORAL | Status: DC
  Administered 2016-07-05: 15 mg via ORAL
  Filled 2016-07-05 (×2): qty 1

## 2016-07-05 NOTE — BHH Group Notes (Signed)
[place] LCSW Group Therapy 07/05/2016 1:15 PM  Type of Therapy: Group Therapy- Emotion Regulation  Participation Level: Reserved  Participation Quality:  Appropriate  Affect: Appropriate  Cognitive: Alert and Oriented   Insight:  Developing/Improving  Engagement in Therapy: Developing/Improving and Engaged   Modes of Intervention: Clarification, Confrontation, Discussion, Education, Exploration, Limit-setting, Orientation, Problem-solving, Rapport Building, Art therapist, Socialization and Support  Summary of Progress/Problems: The topic for group today was emotional regulation. This group focused on both positive and negative emotion identification and allowed group members to process ways to identify feelings, regulate negative emotions, and find healthy ways to manage internal/external emotions. Group members were asked to reflect on a time when their reaction to an emotion led to a negative outcome and explored how alternative responses using emotion regulation would have benefited them. Group members were also asked to discuss a time when emotion regulation was utilized when a negative emotion was experienced.   Adriana Reams, LCSW 07/05/2016 4:06 PM

## 2016-07-05 NOTE — BHH Suicide Risk Assessment (Signed)
[place] INPATIENT:  Family/Significant Other Suicide Prevention Education  Suicide Prevention Education:  Patient Refusal for Family/Significant Other Suicide Prevention Education: The patient Laura Huerta has refused to provide written consent for family/significant other to be provided Family/Significant Other Suicide Prevention Education during admission and/or prior to discharge.  Physician notified.  Gladstone Lighter 07/05/2016, 12:58 PM

## 2016-07-05 NOTE — Progress Notes (Signed)
Adult Psychoeducational Group Note  Date:  07/05/2016 Time:  9:26 PM  Group Topic/Focus:  Wrap-Up Group:   The focus of this group is to help patients review their daily goal of treatment and discuss progress on daily workbooks.  Participation Level:  Active  Participation Quality:  Appropriate and Attentive  Affect:  Appropriate  Cognitive:  Appropriate  Insight: Appropriate  Engagement in Group:  Engaged  Modes of Intervention:  Discussion  Additional Comments:  Pt stated her goal was to get out and talk with people today. Pt stated she has talked to the doctors and nurses about discharge. Pt states she has thought about what got her into the hospital and her suicidal thoughts and how suicide doesn't just affect her, but also others around her. Pt was thankful that she got to see her children today.  [name] 07/05/2016, 9:26 PM

## 2016-07-05 NOTE — Progress Notes (Signed)
DAR NOTE: Pt present with flat affect and depressed mood in the unit. Pt has been isolating herself and has been in bed most of the time. Pt denies physical pain, took all her meds as scheduled. As per self inventory, pt had a good night sleep, good appetite, normal energy, and good concentration. Pt rate depression at 0, hopeless ness at 0, and anxiety at 0. Pt's safety ensured with 15 minute and environmental checks. Pt currently denies SI/HI and A/V hallucinations. Pt verbally agrees to seek staff if SI/HI or A/VH occurs and to consult with staff before acting on these thoughts. Will continue POC.

## 2016-07-05 NOTE — BHH Suicide Risk Assessment (Signed)
[facility] Admission Suicide Risk Assessment   Nursing information obtained from:  Patient, Review of record Demographic factors:  Low socioeconomic status, Unemployed Current Mental Status:  Self-harm behaviors Loss Factors:  Financial problems / change in socioeconomic status Historical Factors:  Impulsivity Risk Reduction Factors:  Responsible for children under 53 years of age, Living with another person, especially a relative, Positive social support  Total Time spent with patient: 30 minutes Principal Problem: MDD (major depressive disorder), recurrent severe, without psychosis ([place]) Diagnosis:   Patient Active Problem List   Diagnosis Date Noted  . MDD (major depressive disorder), recurrent severe, without psychosis ([place]) [F33.2] 07/04/2016  . Hypertension [I10] 03/23/2016  . Hyperlipidemia [E78.5] 03/23/2016  . Depression [F32.9] 03/23/2016  . Shingles [B02.9] 03/12/2015  . Left lower lobe pneumonia ([place]) [J18.1] 02/04/2015  . Abnormality of gait [R26.9] 11/18/2014  . Rectal pain [K62.89] 09/18/2013  . Internal hemorrhoids [K64.8] 06/05/2013  . Hemorrhage of rectum and anus [K62.5] 04/22/2013  . Personal history of colonic polyps [Z86.010] 04/22/2013  . Abnormal EKG [R94.31] 10/10/2012  . CA IN SITU, BREAST [D05.90] 11/30/2006  . TOBACCO ABUSE [F17.200] 11/30/2006  . COCAINE ABUSE [F14.10] 11/30/2006  . GERD (gastroesophageal reflux disease) [K21.9] 11/30/2006  . TENOSYNOVITIS, HAND/WRIST NEC [id number], M65.839] 11/30/2006   Subjective Data: Patient with depression , stressors of not being able to find a job, S/P suicide attempt by OD .  Continued Clinical Symptoms:  Alcohol Use Disorder Identification Test Final Score (AUDIT): 3 The "Alcohol Use Disorders Identification Test", Guidelines for Use in Primary Care, Second Edition.  World Pharmacologist [facility]). Score between 0-7:  no or low risk or alcohol related problems. Score between 8-15:  moderate risk of alcohol related  problems. Score between 16-19:  high risk of alcohol related problems. Score 20 or above:  warrants further diagnostic evaluation for alcohol dependence and treatment.   CLINICAL FACTORS:   Depression:   Insomnia   Musculoskeletal: Strength & Muscle Tone: within normal limits Gait & Station: normal Patient leans: N/A  Psychiatric Specialty Exam: Physical Exam  Review of Systems  Psychiatric/Behavioral: Positive for depression. The patient is nervous/anxious.   All other systems reviewed and are negative.   Blood pressure (!) 130/95, pulse 71, temperature 97.8 F (36.6 C), temperature source Oral, resp. rate 16, height 5\' 5"  (1.651 m), weight 67.1 kg (148 lb), last menstrual period 05/11/2011.Body mass index is 24.63 kg/m.  General Appearance: Casual  Eye Contact:  Fair  Speech:  Normal Rate  Volume:  Normal  Mood:  Anxious and Depressed  Affect:  Congruent  Thought Process:  Goal Directed and Descriptions of Associations: Intact  Orientation:  Full (Time, Place, and Person)  Thought Content:  Rumination  Suicidal Thoughts:  S/P OD , contracts for safety on the unit  Homicidal Thoughts:  No  Memory:  Immediate;   Fair Recent;   Fair Remote;   Fair  Judgement:  Fair  Insight:  Fair  Psychomotor Activity:  Normal  Concentration:  Concentration: Fair and Attention Span: Fair  Recall:  [employer] of Knowledge:  Fair  Language:  Fair  Akathisia:  No  Handed:  Right  AIMS (if indicated):     Assets:  Communication Skills Desire for Improvement  ADL's:  Intact  Cognition:  WNL  Sleep:  Number of Hours: 6.25      COGNITIVE FEATURES THAT CONTRIBUTE TO RISK:  Closed-mindedness, Polarized thinking and Thought constriction (tunnel vision)    SUICIDE RISK:   Severe:  Frequent, intense, and enduring suicidal ideation, specific plan, no subjective intent, but some objective markers of intent (i.e., choice of lethal method), the method is accessible, some limited  preparatory behavior, evidence of impaired self-control, severe dysphoria/symptomatology, multiple risk factors present, and few if any protective factors, particularly a lack of social support.  PLAN OF CARE: Case discussed please see HP per NP Mr.Conrad.  I certify that inpatient services furnished can reasonably be expected to improve the patient's condition.   Shaquon Gropp, MD 07/05/2016, 3:40 PM

## 2016-07-05 NOTE — H&P (Signed)
Psychiatric Admission Assessment Adult  Patient Identification: Laura Huerta MRN:  [id number] Date of Evaluation:  07/05/2016 Chief Complaint:  mdd Principal Diagnosis: MDD (major depressive disorder), recurrent severe, without psychosis ([place]) Diagnosis:   Patient Active Problem List   Diagnosis Date Noted  . MDD (major depressive disorder), recurrent severe, without psychosis ([place]) [F33.2] 07/04/2016    Priority: High  . Hypertension [I10] 03/23/2016  . Hyperlipidemia [E78.5] 03/23/2016  . Depression [F32.9] 03/23/2016  . Shingles [B02.9] 03/12/2015  . Left lower lobe pneumonia ([place]) [J18.1] 02/04/2015  . Abnormality of gait [R26.9] 11/18/2014  . Rectal pain [K62.89] 09/18/2013  . Internal hemorrhoids [K64.8] 06/05/2013  . Hemorrhage of rectum and anus [K62.5] 04/22/2013  . Personal history of colonic polyps [Z86.010] 04/22/2013  . Abnormal EKG [R94.31] 10/10/2012  . CA IN SITU, BREAST [D05.90] 11/30/2006  . TOBACCO ABUSE [F17.200] 11/30/2006  . COCAINE ABUSE [F14.10] 11/30/2006  . GERD (gastroesophageal reflux disease) [K21.9] 11/30/2006  . TENOSYNOVITIS, HAND/WRIST NEC [id number], M65.839] 11/30/2006   Subjective: "I've been so down. A lot of it is not being able to provide for my family and being rejected after several job interviews. They are less interested when they find out I can't read. I finished only the 7th grade."  Objective: Pt seen and chart reviewed. Pt is alert/oriented x4, calm, cooperative, and appropriate to situation. Pt denies homicidal ideation and psychosis and does not appear to be responding to internal stimuli. Pt is ruminative about feeling guilty she is not providing financial assistance to her family and son, although states her son receives indirect SSI via his father. Pt is somewhat guarded and vague and states her primary stressor is not being able to read, although this is a problem persisting 52 years and is unlikely to have caused the sudden onset  of depression/anxiety. Will continue to encourage pt to be transparent and less guarded.   History of Present Illness: I have reviewed and concur with HPI elements below, modified as follows: "Laura Huerta is an 51 y.o. female voluntarily reported to ED due to suicidal attempt.  Pt attempted suicide by taking an excessive amount of Trazadone.  Pt denies HI. Pt sts she is depressed about not being able to get a job and not being able to read.  Pt denies AVH, however, did express a plan on how she might harm herself.  Pt denies having a therapist or psychiatrist.    Pt sts she lives with her son.  Pt sts she can return home once she is discharged. Pt denies current abuse of SA. Pt denies legal issus or upcoming court appointments.  Pt denies having any anger or violent history."  Interval history 07/05/16: Pt presented to unit without incident and has been cooperative with staff and participating in the milieu. See narrative above.    Associated Signs/Symptoms: Depression Symptoms:  depressed mood, anhedonia, insomnia, psychomotor agitation, psychomotor retardation, feelings of worthlessness/guilt, difficulty concentrating, recurrent thoughts of death, suicidal thoughts with specific plan, (Hypo) Manic Symptoms:  Impulsivity, Anxiety Symptoms:  Excessive Worry, Psychotic Symptoms:  denies PTSD Symptoms: NA Total Time spent with patient: 45 minutes  Past Psychiatric History: depression  Is the patient at risk to self? Yes.    Has the patient been a risk to self in the past 6 months? Yes.    Has the patient been a risk to self within the distant past? Yes.    Is the patient a risk to others? No.  Has the patient been a risk to  others in the past 6 months? No.  Has the patient been a risk to others within the distant past? No.   Prior Inpatient Therapy:   Prior Outpatient Therapy:    Alcohol Screening: 1. How often do you have a drink containing alcohol?: 2 to 4 times a  month 2. How many drinks containing alcohol do you have on a typical day when you are drinking?: 3 or 4 3. How often do you have six or more drinks on one occasion?: Never Preliminary Score: 1 9. Have you or someone else been injured as a result of your drinking?: No 10. Has a relative or friend or a doctor or another health worker been concerned about your drinking or suggested you cut down?: No Alcohol Use Disorder Identification Test Final Score (AUDIT): 3 Brief Intervention: AUDIT score less than 7 or less-screening does not suggest unhealthy drinking-brief intervention not indicated Substance Abuse History in the last 12 months:  No. Consequences of Substance Abuse: NA Previous Psychotropic Medications: Yes  Psychological Evaluations: Yes  Past Medical History:  Past Medical History:  Diagnosis Date  . Abnormality of gait 11/18/2014  . Acid reflux   . Anxiety   . Bronchitis   . Depression   . Hyperlipidemia   . Hypertension     Past Surgical History:  Procedure Laterality Date  . BREAST EXCISIONAL BIOPSY Left 11/02/2003  . CESAREAN SECTION    . HEMORRHOID BANDING  2015  . TUBAL LIGATION     Family History:  Family History  Problem Relation Age of Onset  . Hypertension Mother   . Heart failure Mother        Died age 60  . COPD Mother   . Asthma Mother   . Diabetes Mother   . Depression Mother   . Hypertension Father   . Diabetes Father   . Breast cancer Maternal Aunt    Family Psychiatric  History: depression Tobacco Screening: Have you used any form of tobacco in the last 30 days? (Cigarettes, Smokeless Tobacco, Cigars, and/or Pipes): Yes Tobacco use, Select all that apply: 5 or more cigarettes per day Are you interested in Tobacco Cessation Medications?: Yes, will notify MD for an order Counseled patient on smoking cessation including recognizing danger situations, developing coping skills and basic information about quitting provided: Yes Social History:   History  Alcohol Use  . 0.0 oz/week    Comment: 40-60 oz. per week just weekends     History  Drug Use No    Additional Social History: Marital status: Single Does patient have children?: Yes How many children?: 1 How is patient's relationship with their children?: good relationship with son                         Allergies:  No Known Allergies Lab Results:  Results for orders placed or performed during the hospital encounter of 07/03/16 (from the past 48 hour(s))  Comprehensive metabolic panel     Status: Abnormal   Collection Time: 07/03/16  5:32 PM  Result Value Ref Range   Sodium 140 135 - 145 mmol/L   Potassium 3.5 3.5 - 5.1 mmol/L   Chloride 107 101 - 111 mmol/L   CO2 24 22 - 32 mmol/L   Glucose, Bld 129 (H) 65 - 99 mg/dL   BUN 8 6 - 20 mg/dL   Creatinine, Ser 0.88 0.44 - 1.00 mg/dL   Calcium 9.4 8.9 - 10.3 mg/dL   Total  Protein 7.0 6.5 - 8.1 g/dL   Albumin 3.9 3.5 - 5.0 g/dL   AST 18 15 - 41 U/L   ALT 8 (L) 14 - 54 U/L   Alkaline Phosphatase 71 38 - 126 U/L   Total Bilirubin 0.4 0.3 - 1.2 mg/dL   GFR calc non Af Amer >60 >60 mL/min   GFR calc Af Amer >60 >60 mL/min    Comment: (NOTE) The eGFR has been calculated using the CKD EPI equation. This calculation has not been validated in all clinical situations. eGFR's persistently <60 mL/min signify possible Chronic Kidney Disease.    Anion gap 9 5 - 15  Ethanol     Status: None   Collection Time: 07/03/16  5:32 PM  Result Value Ref Range   Alcohol, Ethyl (B) <5 <5 mg/dL    Comment:        LOWEST DETECTABLE LIMIT FOR SERUM ALCOHOL IS 5 mg/dL FOR MEDICAL PURPOSES ONLY   Salicylate level     Status: None   Collection Time: 07/03/16  5:32 PM  Result Value Ref Range   Salicylate Lvl <8.5 2.8 - 30.0 mg/dL  Acetaminophen level     Status: Abnormal   Collection Time: 07/03/16  5:32 PM  Result Value Ref Range   Acetaminophen (Tylenol), Serum <10 (L) 10 - 30 ug/mL    Comment:        THERAPEUTIC  CONCENTRATIONS VARY SIGNIFICANTLY. A RANGE OF 10-30 ug/mL MAY BE AN EFFECTIVE CONCENTRATION FOR MANY PATIENTS. HOWEVER, SOME ARE BEST TREATED AT CONCENTRATIONS OUTSIDE THIS RANGE. ACETAMINOPHEN CONCENTRATIONS >150 ug/mL AT 4 HOURS AFTER INGESTION AND >50 ug/mL AT 12 HOURS AFTER INGESTION ARE OFTEN ASSOCIATED WITH TOXIC REACTIONS.   cbc     Status: Abnormal   Collection Time: 07/03/16  5:32 PM  Result Value Ref Range   WBC 10.2 4.0 - 10.5 K/uL   RBC 5.58 (H) 3.87 - 5.11 MIL/uL   Hemoglobin 13.2 12.0 - 15.0 g/dL   HCT 41.2 36.0 - 46.0 %   MCV 73.8 (L) 78.0 - 100.0 fL   MCH 23.7 (L) 26.0 - 34.0 pg   MCHC 32.0 30.0 - 36.0 g/dL   RDW 14.8 11.5 - 15.5 %   Platelets 264 150 - 400 K/uL  Magnesium     Status: None   Collection Time: 07/03/16  7:00 PM  Result Value Ref Range   Magnesium 2.1 1.7 - 2.4 mg/dL    Blood Alcohol level:  Lab Results  Component Value Date   ETH <5 63/14/9702    Metabolic Disorder Labs:  Lab Results  Component Value Date   HGBA1C 6.10 02/25/2015   No results found for: PROLACTIN Lab Results  Component Value Date   CHOL 120 03/23/2016   TRIG 112 03/23/2016   HDL 44 (L) 03/23/2016   CHOLHDL 2.7 03/23/2016   VLDL 22 03/23/2016   LDLCALC 54 03/23/2016   LDLCALC 55 01/03/2016    Current Medications: Current Facility-Administered Medications  Medication Dose Route Frequency Provider Last Rate Last Dose  . acetaminophen (TYLENOL) tablet 650 mg  650 mg Oral Q6H PRN Laverle Hobby, PA-C      . alum & mag hydroxide-simeth (MAALOX/MYLANTA) 200-200-20 MG/5ML suspension 30 mL  30 mL Oral Q4H PRN Laverle Hobby, PA-C      . atorvastatin (LIPITOR) tablet 10 mg  10 mg Oral Daily Patriciaann Clan E, PA-C   10 mg at 07/05/16 0831  . buPROPion (WELLBUTRIN SR) 12 hr tablet 150 mg  150 mg Oral BID Laverle Hobby, PA-C   150 mg at 07/05/16 0831  . FLUoxetine (PROZAC) capsule 10 mg  10 mg Oral Daily Laverle Hobby, PA-C   10 mg at 07/05/16 0831  .  fluticasone (FLONASE) 50 MCG/ACT nasal spray 2 spray  2 spray Each Nare Daily Laverle Hobby, PA-C      . lisinopril (PRINIVIL,ZESTRIL) tablet 10 mg  10 mg Oral Daily Patriciaann Clan E, PA-C   10 mg at 07/05/16 0831  . magnesium hydroxide (MILK OF MAGNESIA) suspension 30 mL  30 mL Oral Daily PRN Patriciaann Clan E, PA-C      . mirtazapine (REMERON SOL-TAB) disintegrating tablet 15 mg  15 mg Oral QHS,MR X 1 Laverle Hobby, PA-C   15 mg at 07/04/16 2258  . nicotine (NICODERM CQ - dosed in mg/24 hours) patch 21 mg  21 mg Transdermal Daily Patriciaann Clan E, PA-C   21 mg at 07/05/16 5726  . pantoprazole (PROTONIX) EC tablet 40 mg  40 mg Oral Daily Laverle Hobby, PA-C   40 mg at 07/05/16 0831  . polyethylene glycol (MIRALAX / GLYCOLAX) packet 17 g  17 g Oral BID PRN Laverle Hobby, PA-C       PTA Medications: Prescriptions Prior to Admission  Medication Sig Dispense Refill Last Dose  . atorvastatin (LIPITOR) 10 MG tablet Take 1 tablet (10 mg total) by mouth daily. 90 tablet 3 07/02/2016 at 1200  . benzonatate (TESSALON) 100 MG capsule Take 1 capsule (100 mg total) by mouth 3 (three) times daily as needed for cough. (Patient not taking: Reported on 07/03/2016) 30 capsule 0 Not Taking at Unknown time  . buPROPion (WELLBUTRIN SR) 150 MG 12 hr tablet Take 1 tablet (150 mg total) by mouth 2 (two) times daily. 60 tablet 2 07/02/2016 at pm  . fluticasone (FLONASE) 50 MCG/ACT nasal spray Place 2 sprays into both nostrils daily. For 7 day. (Patient taking differently: Place 2 sprays into both nostrils daily as needed for allergies. For 7 day.) 16 g 0 PRN at PRN  . lisinopril (PRINIVIL,ZESTRIL) 10 MG tablet Take 1 tablet (10 mg total) by mouth daily. 30 tablet 2 07/02/2016 at am  . meclizine (ANTIVERT) 25 MG tablet Take 1 tablet (25 mg total) by mouth 3 (three) times daily as needed for dizziness. (Patient not taking: Reported on 07/03/2016) 30 tablet 0 Not Taking at Unknown time  . meclizine (ANTIVERT) 25 MG  tablet Take 1 tablet (25 mg total) by mouth 3 (three) times daily as needed for dizziness or nausea. 30 tablet 0 PRN at PRN  . nicotine (NICODERM CQ - DOSED IN MG/24 HOURS) 21 mg/24hr patch Place 1 patch (21 mg total) onto the skin daily. 28 patch 1 Past Month at Unknown time  . omeprazole (PRILOSEC) 20 MG capsule Take 1 capsule (20 mg total) by mouth 2 (two) times daily. (Patient not taking: Reported on 07/03/2016) 60 capsule 1 Not Taking at Unknown time  . omeprazole (PRILOSEC) 40 MG capsule Take 40 mg by mouth 2 (two) times daily.  0 07/02/2016 at pm  . ondansetron (ZOFRAN ODT) 4 MG disintegrating tablet Take 1 tablet (4 mg total) by mouth every 8 (eight) hours as needed for nausea or vomiting. (Patient not taking: Reported on 07/03/2016) 20 tablet 0 Not Taking at Unknown time  . oseltamivir (TAMIFLU) 75 MG capsule Take 1 capsule (75 mg total) by mouth 2 (two) times daily. (Patient not taking: Reported on 07/03/2016) 10  capsule 0 Not Taking at Unknown time  . phenol (CHLORASEPTIC) 1.4 % LIQD Use as directed 1 spray in the mouth or throat as needed for throat irritation / pain. (Patient not taking: Reported on 07/03/2016)  0 Not Taking at Unknown time  . polyethylene glycol powder (GLYCOLAX/MIRALAX) powder Take 17 g by mouth 2 (two) times daily as needed for mild constipation. (Patient taking differently: Take 17 g by mouth 2 (two) times a week. ) 255 g 0 Past Week at Unknown time  . traZODone (DESYREL) 50 MG tablet Take 1 tablet (50 mg total) by mouth at bedtime as needed for sleep. (Patient taking differently: Take 50 mg by mouth at bedtime. ) 30 tablet 2 07/02/2016 at pm    Musculoskeletal: Strength & Muscle Tone: within normal limits Gait & Station: normal Patient leans: N/A  Psychiatric Specialty Exam: Physical Exam  Review of Systems  Psychiatric/Behavioral: Positive for depression and suicidal ideas. The patient is nervous/anxious and has insomnia.   All other systems reviewed and are  negative.   Blood pressure (!) 130/95, pulse 71, temperature 97.8 F (36.6 C), temperature source Oral, resp. rate 16, height _0  (1.651 m), weight 67.1 kg (148 lb), last menstrual period 05/11/2011.Body mass index is 24.63 kg/m.  General Appearance: Casual and Fairly Groomed  Eye Contact:  Fair  Speech:  Clear and Coherent and Normal Rate  Volume:  Normal  Mood:  Anxious and Depressed  Affect:  Appropriate, Constricted and Depressed  Thought Process:  Coherent, Goal Directed, Linear and Descriptions of Associations: Intact  Orientation:  Full (Time, Place, and Person)  Thought Content:  Ruminative about not having a job, not being able to read  Suicidal Thoughts:  Yes.  with intent/plan  Homicidal Thoughts:  No  Memory:  Immediate;   Fair Recent;   Fair Remote;   Fair  Judgement:  Fair  Insight:  Fair  Psychomotor Activity:  Normal  Concentration:  Concentration: Fair and Attention Span: Fair  Recall:  [employer] of Knowledge:  Fair  Language:  Fair  Akathisia:  No  Handed:    AIMS (if indicated):     Assets:  Communication Skills Desire for Improvement Resilience Social Support  ADL's:  Intact  Cognition:  WNL  Sleep:  Number of Hours: 6.25    Treatment Plan Summary: MDD (major depressive disorder), recurrent severe, without psychosis ([place]) unstable, managed as below:  Observation Level/Precautions:  15 minute checks  Laboratory:  Labs resulted, reviewed, and stable at this time.   Psychotherapy:  Group therapy, individual therapy, psychoeducation  Medications:  See MAR above  Consultations: None    Discharge Concerns: None    Estimated LOS: 5-7 days  Other:  N/A   Medications: -Discontinue wellbutrin (redundant and perhaps too activating) -Increase prozac from 10 to 53m po daily for depression -Continue remeron 168mpo qhs for insomnia and depressive symptoms -Continue home meds for HTN, HLD such as lisinopril 1084mnd lipitor 72m37m daily.    Labs/tests/other: Reviewed CBC, CMP, UA, UDS, EKG and unremarkable aside from MCV of 73.8; will check iron and TIBC for anemia; will check TSH; Qtc was 493, will monitor closely   Physician Treatment Plan for Primary Diagnosis: MDD (major depressive disorder), recurrent severe, without psychosis ([facility])[place] Term Goal(s): Improvement in symptoms so as ready for discharge  Short Term Goals: Ability to identify changes in lifestyle to reduce recurrence of condition will improve, Ability to verbalize feelings will improve, Ability to disclose and  discuss suicidal ideas, Ability to demonstrate self-control will improve, Ability to identify and develop effective coping behaviors will improve, Ability to maintain clinical measurements within normal limits will improve and Compliance with prescribed medications will improve  Physician Treatment Plan for Secondary Diagnosis: Principal Problem:   MDD (major depressive disorder), recurrent severe, without psychosis ([place])  Long Term Goal(s): Improvement in symptoms so as ready for discharge  Short Term Goals: Ability to identify changes in lifestyle to reduce recurrence of condition will improve, Ability to verbalize feelings will improve, Ability to disclose and discuss suicidal ideas, Ability to demonstrate self-control will improve, Ability to identify and develop effective coping behaviors will improve, Ability to maintain clinical measurements within normal limits will improve and Compliance with prescribed medications will improve  I certify that inpatient services furnished can reasonably be expected to improve the patient's condition.    Benjamine Mola, FNP 5/30/201810:33 AM

## 2016-07-05 NOTE — Progress Notes (Signed)
Recreation Therapy Notes  Date: 07/05/16 Time: 0930 Location: [address] Dayroom  Group Topic: Stress Management  Goal Area(s) Addresses:  Patient will verbalize importance of using healthy stress management.  Patient will identify positive emotions associated with healthy stress management.   Intervention: Stress Management  Activity :  Body Scan Meditation.  LRT introduced the stress management technique of meditation.  LRT played a meditation to allow patients to take inventory of the sensations they are feeling in their body.  Patients were to follow along as the meditation was played to fully engage in the technique.  Education:  Stress Management, Discharge Planning.   Education Outcome: Acknowledges edcuation/In group clarification offered/Needs additional education  Clinical Observations/Feedback: Pt did not attend group.   Victorino Sparrow, LRT/CTRS         Victorino Sparrow A 07/05/2016 11:37 AM

## 2016-07-05 NOTE — Social Work (Signed)
Referred to [facility], is [place] resident.  Anne Cunningham, LCSW Lead Clinical Social Worker Phone:  [phone]  

## 2016-07-05 NOTE — BHH Counselor (Signed)
Adult Comprehensive Assessment  Patient ID: Laura Huerta, female   DOB: 21-Mar-1965, 51 y.o.   MRN: [id number]  Information Source: Information source: Patient  Current Stressors:  Educational / Learning stressors: 7th grade education; Pt reports being nearly illiterate, making it difficult for her to get a job Employment / Job issues: Pt has been unemployed for 26yrs; discouraged because she has not been able to get a job Family Relationships: None reported Museum/gallery curator / Lack of resources (include bankruptcy): Only income is her son's SSI check Housing / Lack of housing: None reported Physical health (include injuries & life threatening diseases): None reported Social relationships: None reported Substance abuse: Pt reports drinking on the weekend; hx of crack cocaine abuse- clean for 9yrs Bereavement / Loss: 76yo son was killed 47yrs ago  Living/Environment/Situation:  Living Arrangements: Children Living conditions (as described by patient or guardian): stable environment How long has patient lived in current situation?: entire What is atmosphere in current home: Comfortable, Supportive  Family History:  Marital status: Single Does patient have children?: Yes How many children?: 1 How is patient's relationship with their children?: good relationship with son  Childhood History:  Description of patient's relationship with caregiver when they were a child: good relationship with parents Patient's description of current relationship with people who raised him/her: mother is deceased; good relationship with father Does patient have siblings?: Yes Number of Siblings: 2 Description of patient's current relationship with siblings: close to siblings Did patient suffer any verbal/emotional/physical/sexual abuse as a child?: No Did patient suffer from severe childhood neglect?: No Has patient ever been sexually abused/assaulted/raped as an adolescent or adult?: No Was the patient ever  a victim of a crime or a disaster?: Yes Patient description of being a victim of a crime or disaster: son was killed 71yrs ago Witnessed domestic violence?: No Has patient been effected by domestic violence as an adult?: No  Education:  Highest grade of school patient has completed: 7th Currently a Ship broker?: No Learning disability?: No  Employment/Work Situation:   Employment situation: On disability Why is patient on disability: gets son's SSI check How long has patient been on disability: unknown Patient's job has been impacted by current illness: No What is the longest time patient has a held a job?: 10yrs Where was the patient employed at that time?: [employer] Has patient ever been in the [employer]?: No Did You Receive Any Psychiatric Treatment/Services While in the [employer]?: No Are There Guns or Other Weapons in [place]?: No  Financial Resources:   Museum/gallery curator resources: [employer], [employer], Food stamps Does patient have a Programmer, applications or guardian?: No  Alcohol/Substance Abuse:   What has been your use of drugs/alcohol within the last 12 months?: drinks one 40oz of beer per weekend; hx of crack cocaine use- clean for 73yrs If attempted suicide, did drugs/alcohol play a role in this?: No Alcohol/Substance Abuse Treatment Hx: Past detox, Past Tx, Inpatient If yes, describe treatment: detox at [facility]  Has alcohol/substance abuse ever caused legal problems?: No  Social Support System:   Pensions consultant Support System: Good Describe Community Support System: family is supportive, son's father, friend Type of faith/religion: "God" How does patient's faith help to cope with current illness?: "helps me"  Leisure/Recreation:   Leisure and Hobbies: watct television  Strengths/Needs:   What things does the patient do well?: clean house, cook In what areas does patient struggle / problems for patient: reading, trying to get a job  Discharge  Plan:   Does patient have access to transportation?: Yes Will patient be returning to same living situation after discharge?: Yes Currently receiving community mental health services: No If no, would patient like referral for services when discharged?: Yes (What [place] Does patient have financial barriers related to discharge medications?: No  Summary/Recommendations:     Patient is a 51 year old female with a diagnosis of Major Depressive Disorder. Pt presented to the hospital after an intentional overdose. Pt reports primary trigger(s) for admission include difficulty finding a job due to difficulty reading/completing applications. Patient will benefit from crisis stabilization, medication evaluation, group therapy and psycho education in addition to case management for discharge planning. At discharge it is recommended that Pt remain compliant with established discharge plan and continued treatment.   [place] Lighter. 07/05/2016

## 2016-07-06 LAB — TSH: TSH: 1.934 u[IU]/mL (ref 0.350–4.500)

## 2016-07-06 MED ORDER — POLYETHYLENE GLYCOL 3350 17 GM/SCOOP PO POWD: 17.0000 g | Freq: Two times a day (BID) | ORAL | 0 refills | Status: DC | PRN

## 2016-07-06 MED ORDER — DOCUSATE SODIUM 100 MG PO CAPS: 100.0000 mg | ORAL_CAPSULE | Freq: Two times a day (BID) | ORAL | 0 refills | Status: DC

## 2016-07-06 MED ORDER — MIRTAZAPINE 15 MG PO TBDP: ORAL_TABLET | ORAL | 0 refills | Status: DC

## 2016-07-06 MED ORDER — LISINOPRIL 10 MG PO TABS: 10.0000 mg | ORAL_TABLET | Freq: Every day | ORAL | 0 refills | Status: DC

## 2016-07-06 MED ORDER — ATORVASTATIN CALCIUM 10 MG PO TABS: 10.0000 mg | ORAL_TABLET | Freq: Every day | ORAL | 0 refills | Status: DC

## 2016-07-06 MED ORDER — FLUOXETINE HCL 20 MG PO CAPS: 20.0000 mg | ORAL_CAPSULE | Freq: Every day | ORAL | 0 refills | Status: DC

## 2016-07-06 MED ORDER — OMEPRAZOLE 40 MG PO CPDR: 40.0000 mg | DELAYED_RELEASE_CAPSULE | Freq: Two times a day (BID) | ORAL | 0 refills | Status: DC

## 2016-07-06 MED ORDER — NICOTINE 21 MG/24HR TD PT24: 21.0000 mg | MEDICATED_PATCH | Freq: Every day | TRANSDERMAL | 1 refills | Status: DC

## 2016-07-06 MED ORDER — FLUTICASONE PROPIONATE 50 MCG/ACT NA SUSP: 2.0000 | Freq: Every day | NASAL | Status: DC | PRN

## 2016-07-06 NOTE — Discharge Summary (Signed)
Physician Discharge Summary Note  Patient:  Laura Huerta is an 51 y.o., female MRN:  [id number] DOB:  [birth year] Patient phone:  [phone] (home)  Patient address:   [place] [address],   Total Time spent with patient: Greater than 30 minutes  Date of Admission:  07/04/2016 Date of Discharge: 07-06-16  Reason for Admission: Suicide attempt by overdose.  Principal Problem: MDD (major depressive disorder), recurrent severe, without psychosis [facility]  Discharge Diagnoses: Patient Active Problem List   Diagnosis Date Noted  . MDD (major depressive disorder), recurrent severe, without psychosis ([place]) [F33.2] 07/04/2016  . Hypertension [I10] 03/23/2016  . Hyperlipidemia [E78.5] 03/23/2016  . Depression [F32.9] 03/23/2016  . Shingles [B02.9] 03/12/2015  . Left lower lobe pneumonia ([place]) [J18.1] 02/04/2015  . Abnormality of gait [R26.9] 11/18/2014  . Rectal pain [K62.89] 09/18/2013  . Internal hemorrhoids [K64.8] 06/05/2013  . Hemorrhage of rectum and anus [K62.5] 04/22/2013  . Personal history of colonic polyps [Z86.010] 04/22/2013  . Abnormal EKG [R94.31] 10/10/2012  . CA IN SITU, BREAST [D05.90] 11/30/2006  . TOBACCO ABUSE [F17.200] 11/30/2006  . COCAINE ABUSE [F14.10] 11/30/2006  . GERD (gastroesophageal reflux disease) [K21.9] 11/30/2006  . TENOSYNOVITIS, HAND/WRIST NEC [id number], M65.839] 11/30/2006   Past Psychiatric History: MDD  Past Medical History:  Past Medical History:  Diagnosis Date  . Abnormality of gait 11/18/2014  . Acid reflux   . Anxiety   . Bronchitis   . Depression   . Hyperlipidemia   . Hypertension     Past Surgical History:  Procedure Laterality Date  . BREAST EXCISIONAL BIOPSY Left 11/02/2003  . CESAREAN SECTION    . HEMORRHOID BANDING  2015  . TUBAL LIGATION     Family History:  Family History  Problem Relation Age of Onset  . Hypertension Mother   . Heart failure Mother        Died age 65  . COPD Mother   .  Asthma Mother   . Diabetes Mother   . Depression Mother   . Hypertension Father   . Diabetes Father   . Breast cancer Maternal Aunt    Family Psychiatric  History: See H&P  Social History:  History  Alcohol Use  . 0.0 oz/week    Comment: 40-60 oz. per week just weekends     History  Drug Use No    Social History   Social History  . Marital status: Single    Spouse name: N/A  . Number of children: 1  . Years of education: 7   Occupational History  . 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 Smoking status: Current Every Day Smoker    Packs/day: 2.00    Years: 10.00    Types: Cigarettes  . Smokeless tobacco: Never Used  . Alcohol use 0.0 oz/week     Comment: 40-60 oz. per week just weekends  . Drug use: No  . Sexual activity: Yes    Birth control/ protection: Surgical   Other Topics Concern  . None   Social History Narrative   Lives with son.     Patient drinks about 3 glasses of tea daily.   Patient is right handed.    Hospital Course: (Per admission assessment):  "I've been so down. A lot of it is not being able to provide for my family and being rejected after several job interviews. They are less interested when they find out I can't read. I finished only the 7th grade".  Laura Huerta was admitted to the [facility] adult unit with complaints of worsening symptoms of depression & crisis management due to suicide attempt by overdose. He cited joblessness,  financial related stressors & illitracy as the triggers. He was in need of mood stabilization treatments.   During the course of his hospitalization, Laura Huerta was medicated & discharged on, Fluoxetine 20 mg for depression, Nicotine patch 21 mg for smoking cessation & Mirtazapine 15 mg for insomnia/depression. He was enrolled & participated in the group counseling sessions being offered & held on this unit. He was counseled & learned coping skills that should help him cope better & maintain mood stability  after discharge. He was resumed on all his pertinent home medications for the other previously existing medical issues that he presented. He tolerated his treatment regimen without any adverse effects reported.   While his treatment was on going, Laura Huerta's improvement was monitored by observation & his daily reports of symptom reduction noted.  His emotional & mental status were monitored by daily self-inventory reports completed by him & the clinical staff. He was evaluated daily by the treatment team for mood stability & the need for continued recovery after discharge. He was offered further treatment option upon discharge & will follow up with the outpatient psychiatric services as listed below.     Upon discharge, Laura Huerta was both mentally & medically stable. He is currently denying suicidal, homicidal ideation, auditory, visual/tactile hallucinations, delusional thoughts & or paranoia. He left [facility] with all personal belongings in no apparent distress. Transportation per his arrangement.  Physical Findings: AIMS: Facial and Oral Movements Muscles of Facial Expression: None, normal Lips and Perioral Area: None, normal Jaw: None, normal Tongue: None, normal,Extremity Movements Upper (arms, wrists, hands, fingers): None, normal Lower (legs, knees, ankles, toes): None, normal, Trunk Movements Neck, shoulders, hips: None, normal, Overall Severity Severity of abnormal movements (highest score from questions above): None, normal Incapacitation due to abnormal movements: None, normal Patient's awareness of abnormal movements (rate only patient's report): No Awareness, Dental Status Current problems with teeth and/or dentures?: No Does patient usually wear dentures?: No  CIWA:    COWS:     Musculoskeletal: Strength & Muscle Tone: within normal limits Gait & Station: normal Patient leans: N/A  Psychiatric Specialty Exam: Physical Exam  Constitutional: She is oriented to person, place, and  time. She appears well-developed.  HENT:  Head: Normocephalic.  Eyes: Pupils are equal, round, and reactive to light.  Cardiovascular: Normal rate.   Respiratory: Effort normal.  GI: Soft.  Genitourinary:  Genitourinary Comments: Deferred  Musculoskeletal: Normal range of motion.  Neurological: She is alert and oriented to person, place, and time.  Skin: Skin is warm.    Review of Systems  Constitutional: Negative.   HENT: Negative.   Eyes: Negative.   Cardiovascular: Negative.   Gastrointestinal: Negative.   Genitourinary: Negative.   Musculoskeletal: Negative.   Skin: Negative.   Neurological: Negative.   Endo/Heme/Allergies: Negative.   Psychiatric/Behavioral: Positive for depression (Stable). Negative for hallucinations, memory loss, substance abuse and suicidal ideas. The patient has insomnia (Stable). The patient is not nervous/anxious.     Blood pressure (!) 156/96, pulse 71, temperature 97.4 F (36.3 C), temperature source Oral, resp. rate 16, height 5\' 5"  (1.651 m), weight 67.1 kg (148 lb), last menstrual period 05/11/2011.Body mass index is 24.63 kg/m.  See Md's SRA   Have you used any form of tobacco in the last 30 days? (Cigarettes, Smokeless Tobacco, Cigars, and/or Pipes): Yes  Has this  patient used any form of tobacco in the last 30 days? (Cigarettes, Smokeless Tobacco, Cigars, and/or Pipes): Yes, provided with a nicotine patch prescription 21 mg upon discharge for smoking cessation.  Blood Alcohol level:  Lab Results  Component Value Date   ETH <5 83/38/2505   Metabolic Disorder Labs:  Lab Results  Component Value Date   HGBA1C 6.10 02/25/2015   No results found for: PROLACTIN Lab Results  Component Value Date   CHOL 120 03/23/2016   TRIG 112 03/23/2016   HDL 44 (L) 03/23/2016   CHOLHDL 2.7 03/23/2016   VLDL 22 03/23/2016   LDLCALC 54 03/23/2016   LDLCALC 55 01/03/2016   See Psychiatric Specialty Exam and Suicide Risk Assessment completed by  Attending Physician prior to discharge.  Discharge destination:  Home  Is patient on multiple antipsychotic therapies at discharge:  No   Has Patient had three or more failed trials of antipsychotic monotherapy by history:  No  Recommended Plan for Multiple Antipsychotic Therapies: NA   Allergies as of 07/06/2016   No Known Allergies     Medication List    STOP taking these medications   benzonatate 100 MG capsule Commonly known as:  TESSALON   buPROPion 150 MG 12 hr tablet Commonly known as:  WELLBUTRIN SR   meclizine 25 MG tablet Commonly known as:  ANTIVERT   ondansetron 4 MG disintegrating tablet Commonly known as:  ZOFRAN ODT   oseltamivir 75 MG capsule Commonly known as:  TAMIFLU   phenol 1.4 % Liqd Commonly known as:  CHLORASEPTIC   traZODone 50 MG tablet Commonly known as:  DESYREL     TAKE these medications     Indication  atorvastatin 10 MG tablet Commonly known as:  LIPITOR Take 1 tablet (10 mg total) by mouth daily. For high Cholesterol What changed:  additional instructions  Indication:  Inherited Homozygous Hypercholesterolemia, High Amount of Fats in the Blood   docusate sodium 100 MG capsule Commonly known as:  COLACE Take 1 capsule (100 mg total) by mouth 2 (two) times daily. (May purchase from the over the counter): For constipation  Indication:  Constipation   FLUoxetine 20 MG capsule Commonly known as:  PROZAC Take 1 capsule (20 mg total) by mouth daily. For depression Start taking on:  07/07/2016  Indication:  Major Depressive Disorder   fluticasone 50 MCG/ACT nasal spray Commonly known as:  FLONASE Place 2 sprays into both nostrils daily as needed for allergies. For allergies. What changed:  when to take this  reasons to take this  additional instructions  Indication:  Allergic Rhinitis, Signs and Symptoms of Nose Diseases   lisinopril 10 MG tablet Commonly known as:  PRINIVIL,ZESTRIL Take 1 tablet (10 mg total) by mouth  daily. For high blood pressure What changed:  additional instructions  Indication:  High Blood Pressure Disorder   mirtazapine 15 MG disintegrating tablet Commonly known as:  REMERON SOL-TAB Take 1 tablet (15 mg) at bed time as needed: For sleep  Indication:  Major Depressive Disorder   nicotine 21 mg/24hr patch Commonly known as:  NICODERM CQ - dosed in mg/24 hours Place 1 patch (21 mg total) onto the skin daily. For smoking cessation What changed:  additional instructions  Indication:  Nicotine Addiction   omeprazole 40 MG capsule Commonly known as:  PRILOSEC Take 1 capsule (40 mg total) by mouth 2 (two) times daily. For acid reflux What changed:  additional instructions  Another medication with the same name was removed. Continue  taking this medication, and follow the directions you see here.  Indication:  Gastroesophageal Reflux Disease   polyethylene glycol powder powder Commonly known as:  GLYCOLAX/MIRALAX Take 17 g by mouth 2 (two) times daily as needed for mild constipation. What changed:  when to take this  reasons to take this  Indication:  Constipation      Follow-up Information    [facility].   Specialty:  Family Medicine Contact information: [address] [place] [address] [phone]          Follow-up recommendations: Activity:  As tolerated Diet: As recommended by your primary care doctor. Keep all scheduled follow-up appointments as recommended.  Comments: Patient is instructed prior to discharge to: Take all medications as prescribed by his/her mental healthcare provider. Report any adverse effects and or reactions from the medicines to his/her outpatient provider promptly. Patient has been instructed & cautioned: To not engage in alcohol and or illegal drug use while on prescription medicines. In the event of worsening symptoms, patient is instructed to call the crisis hotline, 911 and  or go to the nearest ED for appropriate evaluation and treatment of symptoms. To follow-up with his/her primary care provider for your other medical issues, concerns and or health care needs.   Signed: Encarnacion Slates, NP, PMHNP, FNP-BC 07/06/2016, 9:34 AM

## 2016-07-06 NOTE — Progress Notes (Signed)
Pt reports she is new to the unit earlier today.  She has been observed out in the milieu and in the dayroom watching TV.  She denies SI/HI/AVH at this time, and is glad that she came to the hospital to get help.  She explained to Probation officer that her affect sometimes make her look angry, but that is just how she looks.  She wanted Probation officer to know that she "is a nice person" and would be cooperative.  Pt was able to smile and joke with Probation officer.  Pt was given a sleep aid at bedtime.  She makes her needs known to staff.  Support and encouragement offered.  Discharge plans are in process.  Safety maintained with q15 minute checks.

## 2016-07-06 NOTE — Tx Team (Signed)
Interdisciplinary Treatment and Diagnostic Plan Update 07/06/2016 Time of Session: 9:30am  Laura Huerta  MRN: [id number]  Principal Diagnosis: MDD (major depressive disorder), recurrent severe, without psychosis ([place])  Secondary Diagnoses: Principal Problem:   MDD (major depressive disorder), recurrent severe, without psychosis ([place])   Current Medications:  Current Facility-Administered Medications  Medication Dose Route Frequency Provider Last Rate Last Dose  . acetaminophen (TYLENOL) tablet 650 mg  650 mg Oral Q6H PRN Laverle Hobby, PA-C      . alum & mag hydroxide-simeth (MAALOX/MYLANTA) 200-200-20 MG/5ML suspension 30 mL  30 mL Oral Q4H PRN Laverle Hobby, PA-C      . atorvastatin (LIPITOR) tablet 10 mg  10 mg Oral Daily Patriciaann Clan E, PA-C   10 mg at 07/06/16 2683  . docusate sodium (COLACE) capsule 100 mg  100 mg Oral BID Ursula Alert, MD   100 mg at 07/06/16 0839  . FLUoxetine (PROZAC) capsule 20 mg  20 mg Oral Daily Eappen, Ria Clock, MD   20 mg at 07/06/16 0839  . fluticasone (FLONASE) 50 MCG/ACT nasal spray 2 spray  2 spray Each Nare Daily Laverle Hobby, PA-C      . lisinopril (PRINIVIL,ZESTRIL) tablet 10 mg  10 mg Oral Daily Patriciaann Clan E, PA-C   10 mg at 07/06/16 4196  . magnesium hydroxide (MILK OF MAGNESIA) suspension 30 mL  30 mL Oral Daily PRN Patriciaann Clan E, PA-C      . mirtazapine (REMERON SOL-TAB) disintegrating tablet 15 mg  15 mg Oral QHS,MR X 1 Laverle Hobby, PA-C   15 mg at 07/05/16 2255  . nicotine (NICODERM CQ - dosed in mg/24 hours) patch 21 mg  21 mg Transdermal Daily Patriciaann Clan E, PA-C   21 mg at 07/06/16 2229  . pantoprazole (PROTONIX) EC tablet 40 mg  40 mg Oral Daily Laverle Hobby, PA-C   40 mg at 07/06/16 7989  . polyethylene glycol (MIRALAX / GLYCOLAX) packet 17 g  17 g Oral BID PRN Laverle Hobby, PA-C        PTA Medications: Prescriptions Prior to Admission  Medication Sig Dispense Refill Last Dose  . benzonatate  (TESSALON) 100 MG capsule Take 1 capsule (100 mg total) by mouth 3 (three) times daily as needed for cough. (Patient not taking: Reported on 07/03/2016) 30 capsule 0 Not Taking at Unknown time  . buPROPion (WELLBUTRIN SR) 150 MG 12 hr tablet Take 1 tablet (150 mg total) by mouth 2 (two) times daily. 60 tablet 2 07/02/2016 at pm  . meclizine (ANTIVERT) 25 MG tablet Take 1 tablet (25 mg total) by mouth 3 (three) times daily as needed for dizziness. (Patient not taking: Reported on 07/03/2016) 30 tablet 0 Not Taking at Unknown time  . meclizine (ANTIVERT) 25 MG tablet Take 1 tablet (25 mg total) by mouth 3 (three) times daily as needed for dizziness or nausea. 30 tablet 0 PRN at PRN  . omeprazole (PRILOSEC) 20 MG capsule Take 1 capsule (20 mg total) by mouth 2 (two) times daily. (Patient not taking: Reported on 07/03/2016) 60 capsule 1 Not Taking at Unknown time  . ondansetron (ZOFRAN ODT) 4 MG disintegrating tablet Take 1 tablet (4 mg total) by mouth every 8 (eight) hours as needed for nausea or vomiting. (Patient not taking: Reported on 07/03/2016) 20 tablet 0 Not Taking at Unknown time  . oseltamivir (TAMIFLU) 75 MG capsule Take 1 capsule (75 mg total) by mouth 2 (two) times daily. (Patient not taking: Reported  on 07/03/2016) 10 capsule 0 Not Taking at Unknown time  . phenol (CHLORASEPTIC) 1.4 % LIQD Use as directed 1 spray in the mouth or throat as needed for throat irritation / pain. (Patient not taking: Reported on 07/03/2016)  0 Not Taking at Unknown time  . traZODone (DESYREL) 50 MG tablet Take 1 tablet (50 mg total) by mouth at bedtime as needed for sleep. (Patient taking differently: Take 50 mg by mouth at bedtime. ) 30 tablet 2 07/02/2016 at pm  . [DISCONTINUED] atorvastatin (LIPITOR) 10 MG tablet Take 1 tablet (10 mg total) by mouth daily. 90 tablet 3 07/02/2016 at 1200  . [DISCONTINUED] fluticasone (FLONASE) 50 MCG/ACT nasal spray Place 2 sprays into both nostrils daily. For 7 day. (Patient taking  differently: Place 2 sprays into both nostrils daily as needed for allergies. For 7 day.) 16 g 0 PRN at PRN  . [DISCONTINUED] lisinopril (PRINIVIL,ZESTRIL) 10 MG tablet Take 1 tablet (10 mg total) by mouth daily. 30 tablet 2 07/02/2016 at am  . [DISCONTINUED] nicotine (NICODERM CQ - DOSED IN MG/24 HOURS) 21 mg/24hr patch Place 1 patch (21 mg total) onto the skin daily. 28 patch 1 Past Month at Unknown time  . [DISCONTINUED] omeprazole (PRILOSEC) 40 MG capsule Take 40 mg by mouth 2 (two) times daily.  0 07/02/2016 at pm  . [DISCONTINUED] polyethylene glycol powder (GLYCOLAX/MIRALAX) powder Take 17 g by mouth 2 (two) times daily as needed for mild constipation. (Patient taking differently: Take 17 g by mouth 2 (two) times a week. ) 255 g 0 Past Week at Unknown time    Treatment Modalities: Medication Management, Group therapy, Case management,  1 to 1 session with clinician, Psychoeducation, Recreational therapy.  Patient Stressors: Financial difficulties Occupational concerns Patient Strengths: Ability for insight Active sense of humor Average or above average intelligence Capable of independent living Communication skills Physical Health Supportive family/friends  Physician Treatment Plan for Primary Diagnosis: MDD (major depressive disorder), recurrent severe, without psychosis ([place]) Long Term Goal(s): Improvement in symptoms so as ready for discharge Short Term Goals: Ability to identify changes in lifestyle to reduce recurrence of condition will improve Ability to verbalize feelings will improve Ability to disclose and discuss suicidal ideas Ability to demonstrate self-control will improve Ability to identify and develop effective coping behaviors will improve Ability to maintain clinical measurements within normal limits will improve Compliance with prescribed medications will improve Ability to identify changes in lifestyle to reduce recurrence of condition will improve Ability to  verbalize feelings will improve Ability to disclose and discuss suicidal ideas Ability to demonstrate self-control will improve Ability to identify and develop effective coping behaviors will improve Ability to maintain clinical measurements within normal limits will improve Compliance with prescribed medications will improve  Medication Management: Evaluate patient's response, side effects, and tolerance of medication regimen.  Therapeutic Interventions: 1 to 1 sessions, Unit Group sessions and Medication administration.  Evaluation of Outcomes: Adequate for Discharge  Physician Treatment Plan for Secondary Diagnosis: Principal Problem:   MDD (major depressive disorder), recurrent severe, without psychosis ([place])  Long Term Goal(s): Improvement in symptoms so as ready for discharge  Short Term Goals: Ability to identify changes in lifestyle to reduce recurrence of condition will improve Ability to verbalize feelings will improve Ability to disclose and discuss suicidal ideas Ability to demonstrate self-control will improve Ability to identify and develop effective coping behaviors will improve Ability to maintain clinical measurements within normal limits will improve Compliance with prescribed medications will improve Ability to  identify changes in lifestyle to reduce recurrence of condition will improve Ability to verbalize feelings will improve Ability to disclose and discuss suicidal ideas Ability to demonstrate self-control will improve Ability to identify and develop effective coping behaviors will improve Ability to maintain clinical measurements within normal limits will improve Compliance with prescribed medications will improve  Medication Management: Evaluate patient's response, side effects, and tolerance of medication regimen.  Therapeutic Interventions: 1 to 1 sessions, Unit Group sessions and Medication administration.  Evaluation of Outcomes: Adequate for  Discharge  RN Treatment Plan for Primary Diagnosis: MDD (major depressive disorder), recurrent severe, without psychosis ([place]) Long Term Goal(s): Knowledge of disease and therapeutic regimen to maintain health will improve  Short Term Goals: Compliance with prescribed medications will improve  Medication Management: RN will administer medications as ordered by provider, will assess and evaluate patient's response and provide education to patient for prescribed medication. RN will report any adverse and/or side effects to prescribing provider.  Therapeutic Interventions: 1 on 1 counseling sessions, Psychoeducation, Medication administration, Evaluate responses to treatment, Monitor vital signs and CBGs as ordered, Perform/monitor CIWA, COWS, AIMS and Fall Risk screenings as ordered, Perform wound care treatments as ordered.  Evaluation of Outcomes: Adequate for Discharge  LCSW Treatment Plan for Primary Diagnosis: MDD (major depressive disorder), recurrent severe, without psychosis ([place]) Long Term Goal(s): Safe transition to appropriate next level of care at discharge, Engage patient in therapeutic group addressing interpersonal concerns. Short Term Goals: Engage patient in aftercare planning with referrals and resources, Increase emotional regulation, Identify triggers associated with mental health/substance abuse issues and Increase skills for wellness and recovery  Therapeutic Interventions: Assess for all discharge needs, 1 to 1 time with Social worker, Explore available resources and support systems, Assess for adequacy in community support network, Educate family and significant other(s) on suicide prevention, Complete Psychosocial Assessment, Interpersonal group therapy.  Evaluation of Outcomes: Adequate for Discharge  Progress in Treatment: Attending groups: Yes  Participating in groups: Yes Taking medication as prescribed: Yes, MD continues to assess for medication changes as  needed Toleration medication: Yes, no side effects reported at this time Family/Significant other contact made: No, pt declined consent. Patient understands diagnosis: Developing insight  Discussing patient identified problems/goals with staff: Yes Medical problems stabilized or resolved: Yes Denies suicidal/homicidal ideation: Yes Issues/concerns per patient self-inventory: None Other: N/A  New problem(s) identified: None identified at this time.   New Short Term/Long Term Goal(s): None identified at this time.   Discharge Plan or Barriers: Pt will return home with her son and follow-up with an outpatient provider.  Reason for Continuation of Hospitalization:   None identified at this time.  Estimated Length of Stay: 0 days  Attendees: Patient: 07/06/2016 9:43 AM  Physician: Dr. Daron Offer 07/06/2016 9:43 AM  Nursing: Opal Sidles, RN 07/06/2016 9:43 AM  RN Care Manager: 07/06/2016 9:43 AM  Social Worker: Matthew Saras, [place] 07/06/2016 9:43 AM  Recreational Therapist:  07/06/2016 9:43 AM  Other: Lindell Spar, NP 07/06/2016 9:43 AM  Other:  07/06/2016 9:43 AM  Other: 07/06/2016 9:43 AM  Scribe for Treatment Team: Georga Kaufmann, MSW,LCSWA 07/06/2016 9:43 AM

## 2016-07-06 NOTE — Progress Notes (Signed)
  [facility] Adult Case Management Discharge Plan :  Will you be returning to the same living situation after discharge:  Yes,  pt returning home. At discharge, do you have transportation home?: Yes,  pt has access to transportation. Do you have the ability to pay for your medications: Yes,  prescriptions and samples provided.  Release of information consent forms completed and in the chart;  Patient's signature needed at discharge.  Patient to Follow up at: Follow-up Information    [facility]   Specialty:  Family Medicine Why:  Assessment appointment 07/07/16 @ 11am for therapy. Medication management appointment 07/11/16 @ 5:15pm. Contact information: [address] [place] [address] [phone]           Next level of care provider has access to [place] and Suicide Prevention discussed: Yes,  with pt.  Have you used any form of tobacco in the last 30 days? (Cigarettes, Smokeless Tobacco, Cigars, and/or Pipes): Yes  Has patient been referred to the Quitline?: Patient refused referral  Patient has been referred for addiction treatment: Yes  Georga Kaufmann, MSW, LCSWA 07/06/2016, 10:49 AM

## 2016-07-06 NOTE — Progress Notes (Signed)
Pt discharged home with her son. Pt was ambulatory,stable and appreciative at that time. All papers and prescriptions were given and valuables returned. Verbal understanding expressed. Denies SI/HI and A/VH. Pt given opportunity to express concerns and ask questions.
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
## 2016-07-11 ENCOUNTER — Ambulatory Visit: Payer: Medicaid Other | Admitting: Family Medicine

## 2016-07-21 ENCOUNTER — Other Ambulatory Visit: Payer: Self-pay | Admitting: Internal Medicine

## 2016-08-03 ENCOUNTER — Ambulatory Visit: Payer: Medicaid Other | Attending: Family Medicine | Admitting: Family Medicine

## 2016-08-03 ENCOUNTER — Encounter: Payer: Self-pay | Admitting: Family Medicine

## 2016-08-03 VITALS — BP 136/86 | HR 74 | Temp 98.1°F | Resp 18 | Ht 64.0 in | Wt 150.2 lb

## 2016-08-03 DIAGNOSIS — K5900 Constipation, unspecified: Secondary | ICD-10-CM | POA: Diagnosis not present

## 2016-08-03 DIAGNOSIS — F17200 Nicotine dependence, unspecified, uncomplicated: Secondary | ICD-10-CM

## 2016-08-03 DIAGNOSIS — E785 Hyperlipidemia, unspecified: Secondary | ICD-10-CM | POA: Insufficient documentation

## 2016-08-03 DIAGNOSIS — I10 Essential (primary) hypertension: Secondary | ICD-10-CM | POA: Insufficient documentation

## 2016-08-03 DIAGNOSIS — F332 Major depressive disorder, recurrent severe without psychotic features: Secondary | ICD-10-CM | POA: Diagnosis not present

## 2016-08-03 DIAGNOSIS — Z79899 Other long term (current) drug therapy: Secondary | ICD-10-CM | POA: Diagnosis not present

## 2016-08-03 DIAGNOSIS — K219 Gastro-esophageal reflux disease without esophagitis: Secondary | ICD-10-CM | POA: Insufficient documentation

## 2016-08-03 MED ORDER — LISINOPRIL 10 MG PO TABS: 10.0000 mg | ORAL_TABLET | Freq: Every day | ORAL | 2 refills | Status: DC

## 2016-08-03 MED ORDER — POLYETHYLENE GLYCOL 3350 17 GM/SCOOP PO POWD: 17.0000 g | Freq: Every day | ORAL | 0 refills | Status: DC | PRN

## 2016-08-03 MED ORDER — NICOTINE POLACRILEX 4 MG MT GUM: 4.0000 mg | CHEWING_GUM | OROMUCOSAL | 11 refills | Status: DC | PRN

## 2016-08-03 MED ORDER — MIRTAZAPINE 15 MG PO TBDP: ORAL_TABLET | ORAL | 0 refills | Status: DC

## 2016-08-03 MED ORDER — OMEPRAZOLE 40 MG PO CPDR: 40.0000 mg | DELAYED_RELEASE_CAPSULE | Freq: Every day | ORAL | 2 refills | Status: DC

## 2016-08-03 NOTE — Progress Notes (Signed)
Subjective:  Patient ID: Laura Huerta, female    DOB: 16-Aug-1965  Age: 51 y.o. MRN: [id number]  CC: Hypertension   HPI Laura Huerta presents for follow up and medication refills. PMH of HTN, GERD, and Depression. Recent history of IVC in May 2018 due to suicide attempt.  She denies current suicidal and homicidal plan or intent.  Possible organic causes contributing are: none.  Risk factors: previous episode of depression and financial constraints. Previous treatment includes Prozac and Remeron and individual therapy and hospitalization. She complains of the following side effects from the treatment: none. She reports still taking Wellbutrin with other treatments. She is requesting dissolving Remeron tablets and reports its more effective.   Reports following up with psychiatric services. History of hypertension. She is not exercising and is not adherent to low salt diet.  She does at check BP at home. Cardiac symptoms none. Patient denies chest pain, chest pressure/discomfort, claudication, lower extremity edema, palpitations and syncope.  Cardiovascular risk factors: dyslipidemia, hypertension, sedentary lifestyle and smoking/ tobacco exposure. Use of agents associated with hypertension: none. History of target organ damage: left ventricular hypertrophy.    Outpatient Medications Prior to Visit  Medication Sig Dispense Refill  . atorvastatin (LIPITOR) 10 MG tablet Take 1 tablet (10 mg total) by mouth daily. For high Cholesterol 1 tablet 0  . docusate sodium (COLACE) 100 MG capsule Take 1 capsule (100 mg total) by mouth 2 (two) times daily. (May purchase from the over the counter): For constipation 1 capsule 0  . FLUoxetine (PROZAC) 20 MG capsule Take 1 capsule (20 mg total) by mouth daily. For depression 30 capsule 0  . fluticasone (FLONASE) 50 MCG/ACT nasal spray Place 2 sprays into both nostrils daily as needed for allergies. For allergies.    . nicotine (NICODERM CQ - DOSED IN  MG/24 HOURS) 21 mg/24hr patch Place 1 patch (21 mg total) onto the skin daily. For smoking cessation 28 patch 1  . lisinopril (PRINIVIL,ZESTRIL) 10 MG tablet Take 1 tablet (10 mg total) by mouth daily. For high blood pressure 1 tablet 0  . mirtazapine (REMERON SOL-TAB) 15 MG disintegrating tablet Take 1 tablet (15 mg) at bed time as needed: For sleep 30 tablet 0  . omeprazole (PRILOSEC) 40 MG capsule Take 1 capsule (40 mg total) by mouth 2 (two) times daily. For acid reflux  0  . omeprazole (PRILOSEC) 40 MG capsule take 1 capsule by mouth twice a day 60 capsule 0  . polyethylene glycol powder (GLYCOLAX/MIRALAX) powder Take 17 g by mouth 2 (two) times daily as needed for mild constipation. 255 g 0   No facility-administered medications prior to visit.     ROS Review of Systems  Constitutional: Negative.   Respiratory: Negative.   Cardiovascular: Negative.   Gastrointestinal: Negative.   Psychiatric/Behavioral: Negative for self-injury, sleep disturbance and suicidal ideas.       History of depression    Objective:  BP 136/86 (BP Location: Left Arm, Patient Position: Sitting, Cuff Size: Normal)   Pulse 74   Temp 98.1 F (36.7 C) (Oral)   Resp 18   Ht 5\' 4"  (1.626 m)   Wt 150 lb 3.2 oz (68.1 kg)   LMP 05/11/2011   SpO2 95%   BMI 25.78 kg/m   BP/Weight 08/03/2016 07/06/2016 4/68/0321  Systolic BP 224 825 003  Diastolic BP 86 96 76  Wt. (Lbs) 150.2 - 148  BMI 25.78 - 24.63    Physical Exam  Constitutional: She appears well-developed and  well-nourished.  HENT:  Head: Normocephalic and atraumatic.  Right Ear: External ear normal.  Left Ear: External ear normal.  Nose: Nose normal.  Mouth/Throat: Oropharynx is clear and moist.  Eyes: Conjunctivae are normal. Pupils are equal, round, and reactive to light.  Neck: No JVD present.  Cardiovascular: Normal rate, regular rhythm, normal heart sounds and intact distal pulses.   Pulmonary/Chest: Effort normal and breath sounds normal.   Abdominal: Soft. Bowel sounds are normal.  Skin: Skin is warm and dry.  Psychiatric: She expresses impulsivity. She expresses no homicidal and no suicidal ideation. She expresses no suicidal plans and no homicidal plans.  Nursing note and vitals reviewed.  Assessment & Plan:   Problem List Items Addressed This Visit      Cardiovascular and Mediastinum   Hypertension - Primary   Relevant Medications   lisinopril (PRINIVIL,ZESTRIL) 10 MG tablet     Digestive   GERD (gastroesophageal reflux disease)   Relevant Medications   omeprazole (PRILOSEC) 40 MG capsule     Other   MDD (major depressive disorder), recurrent severe, without psychosis (HCC)   Relevant Medications   mirtazapine (REMERON SOL-TAB) 15 MG disintegrating tablet    Other Visit Diagnoses    Constipation, unspecified constipation type       Relevant Medications   polyethylene glycol powder (GLYCOLAX/MIRALAX) powder   Ready to quit smoking           Pt.told to discontinue Wellbutrin use.           Relevant Medications          nicotine polacrilex (NICORETTE) 4 MG gum      Meds ordered this encounter  Medications  . mirtazapine (REMERON SOL-TAB) 15 MG disintegrating tablet    Sig: Take 1 tablet (15 mg) at bed time as needed: For sleep    Dispense:  30 tablet    Refill:  0    Order Specific Question:   Supervising Provider    Answer:   Tresa Garter [id number]  . polyethylene glycol powder (GLYCOLAX/MIRALAX) powder    Sig: Take 17 g by mouth daily as needed for mild constipation or moderate constipation.    Dispense:  255 g    Refill:  0    Order Specific Question:   Supervising Provider    Answer:   Tresa Garter [id number]  . omeprazole (PRILOSEC) 40 MG capsule    Sig: Take 1 capsule (40 mg total) by mouth daily.    Dispense:  30 capsule    Refill:  2    Order Specific Question:   Supervising Provider    Answer:   Tresa Garter [id number]  . lisinopril (PRINIVIL,ZESTRIL) 10 MG tablet      Sig: Take 1 tablet (10 mg total) by mouth daily. For high blood pressure    Dispense:  30 tablet    Refill:  2    Order Specific Question:   Supervising Provider    Answer:   Tresa Garter [id number]  . DISCONTD: nicotine polacrilex (NICORETTE) 4 MG gum    Sig: Take 1 each (4 mg total) by mouth as needed for smoking cessation.    Dispense:  100 tablet    Refill:  11    Patient prefers cinnamon flavor if available    Order Specific Question:   Supervising Provider    Answer:   Tresa Garter [id number]  . nicotine polacrilex (NICORETTE) 4 MG gum    Sig: Take 1 each (4  mg total) by mouth as needed for smoking cessation.    Dispense:  100 tablet    Refill:  11    Patient prefers cinnamon flavor if available    Order Specific Question:   Supervising Provider    Answer:   Tresa Garter [[id number]]    Follow-up: Return in about 2 months (around 10/03/2016) for HTN/HLD/Dep.Alfonse Spruce FNP

## 2016-08-03 NOTE — Progress Notes (Signed)
Patient is here for HTN & med refills

## 2016-08-03 NOTE — Patient Instructions (Signed)
Constipation, Adult °Constipation is when a person: °· Poops (has a bowel movement) fewer times in a week than normal. °· Has a hard time pooping. °· Has poop that is dry, hard, or bigger than normal. ° °Follow these instructions at home: °Eating and drinking ° °· Eat foods that have a lot of fiber, such as: °? Fresh fruits and vegetables. °? Whole grains. °? Beans. °· Eat less of foods that are high in fat, low in fiber, or overly processed, such as: °? French fries. °? Hamburgers. °? Cookies. °? Candy. °? Soda. °· Drink enough fluid to keep your pee (urine) clear or pale yellow. °General instructions °· Exercise regularly or as told by your doctor. °· Go to the restroom when you feel like you need to poop. Do not hold it in. °· Take over-the-counter and prescription medicines only as told by your doctor. These include any fiber supplements. °· Do pelvic floor retraining exercises, such as: °? Doing deep breathing while relaxing your lower belly (abdomen). °? Relaxing your pelvic floor while pooping. °· Watch your condition for any changes. °· Keep all follow-up visits as told by your doctor. This is important. °Contact a doctor if: °· You have pain that gets worse. °· You have a fever. °· You have not pooped for 4 days. °· You throw up (vomit). °· You are not hungry. °· You lose weight. °· You are bleeding from the anus. °· You have thin, pencil-like poop (stool). °Get help right away if: °· You have a fever, and your symptoms suddenly get worse. °· You leak poop or have blood in your poop. °· Your belly feels hard or bigger than normal (is bloated). °· You have very bad belly pain. °· You feel dizzy or you faint. °This information is not intended to replace advice given to you by your health care provider. Make sure you discuss any questions you have with your health care provider. °Document Released: 07/12/2007 Document Revised: 08/13/2015 Document Reviewed: 07/14/2015 °Elsevier Interactive Patient Education ©  2017 [employer]vier Inc[employer] ° °

## 2016-09-23 ENCOUNTER — Other Ambulatory Visit: Payer: Self-pay | Admitting: Family Medicine

## 2016-09-23 DIAGNOSIS — G4700 Insomnia, unspecified: Secondary | ICD-10-CM

## 2016-10-26 ENCOUNTER — Other Ambulatory Visit: Payer: Self-pay | Admitting: Family Medicine

## 2016-10-26 DIAGNOSIS — F332 Major depressive disorder, recurrent severe without psychotic features: Secondary | ICD-10-CM

## 2016-10-30 NOTE — Telephone Encounter (Signed)
CMA call regarding medication refill is been sent her pharmacy   Patient did not answer but left a VM & if have any questions just to call back

## 2016-12-05 ENCOUNTER — Other Ambulatory Visit: Payer: Self-pay | Admitting: Family Medicine

## 2016-12-05 ENCOUNTER — Other Ambulatory Visit: Payer: Self-pay | Admitting: Internal Medicine

## 2016-12-05 DIAGNOSIS — I10 Essential (primary) hypertension: Secondary | ICD-10-CM

## 2016-12-07 ENCOUNTER — Encounter (HOSPITAL_COMMUNITY): Payer: Self-pay | Admitting: Emergency Medicine

## 2016-12-07 ENCOUNTER — Emergency Department (HOSPITAL_COMMUNITY)
Admission: EM | Admit: 2016-12-07 | Discharge: 2016-12-07 | Disposition: A | Discharge status: Home / Self Care | Payer: Medicaid Other | Attending: Emergency Medicine | Admitting: Emergency Medicine

## 2016-12-07 DIAGNOSIS — R51 Headache: Secondary | ICD-10-CM | POA: Diagnosis not present

## 2016-12-07 DIAGNOSIS — Z5321 Procedure and treatment not carried out due to patient leaving prior to being seen by health care provider: Secondary | ICD-10-CM | POA: Diagnosis not present

## 2016-12-07 DIAGNOSIS — Z760 Encounter for issue of repeat prescription: Secondary | ICD-10-CM | POA: Diagnosis not present

## 2016-12-07 LAB — CBC
HCT: 39.4 % (ref 36.0–46.0)
Hemoglobin: 12.6 g/dL (ref 12.0–15.0)
MCH: 23.6 pg — ABNORMAL LOW (ref 26.0–34.0)
MCHC: 32 g/dL (ref 30.0–36.0)
MCV: 73.8 fL — ABNORMAL LOW (ref 78.0–100.0)
PLATELETS: 264 10*3/uL (ref 150–400)
RBC: 5.34 MIL/uL — AB (ref 3.87–5.11)
RDW: 14.4 % (ref 11.5–15.5)
WBC: 7.4 10*3/uL (ref 4.0–10.5)

## 2016-12-07 LAB — BASIC METABOLIC PANEL
ANION GAP: 10 (ref 5–15)
BUN: <5 mg/dL — ABNORMAL LOW (ref 6–20)
CALCIUM: 9.6 mg/dL (ref 8.9–10.3)
CO2: 24 mmol/L (ref 22–32)
Chloride: 103 mmol/L (ref 101–111)
Creatinine, Ser: 0.85 mg/dL (ref 0.44–1.00)
Glucose, Bld: 96 mg/dL (ref 65–99)
POTASSIUM: 3.4 mmol/L — AB (ref 3.5–5.1)
SODIUM: 137 mmol/L (ref 135–145)

## 2016-12-07 NOTE — ED Triage Notes (Signed)
Pt reports increased BP and headache since running out of her medicines on Monday. States she's out of all her medications, most concerned about her lisinopril. Reports BP at home 175/106.

## 2016-12-13 ENCOUNTER — Other Ambulatory Visit: Payer: Self-pay

## 2016-12-13 ENCOUNTER — Ambulatory Visit: Payer: Medicaid Other | Attending: Family Medicine | Admitting: Family Medicine

## 2016-12-13 ENCOUNTER — Encounter: Payer: Self-pay | Admitting: Family Medicine

## 2016-12-13 VITALS — BP 151/97 | HR 78 | Temp 97.9°F | Resp 18 | Ht 63.0 in | Wt 163.2 lb

## 2016-12-13 DIAGNOSIS — R0789 Other chest pain: Secondary | ICD-10-CM | POA: Diagnosis not present

## 2016-12-13 DIAGNOSIS — R9431 Abnormal electrocardiogram [ECG] [EKG]: Secondary | ICD-10-CM | POA: Insufficient documentation

## 2016-12-13 DIAGNOSIS — K5900 Constipation, unspecified: Secondary | ICD-10-CM | POA: Insufficient documentation

## 2016-12-13 DIAGNOSIS — Z0000 Encounter for general adult medical examination without abnormal findings: Secondary | ICD-10-CM | POA: Diagnosis not present

## 2016-12-13 DIAGNOSIS — Z79899 Other long term (current) drug therapy: Secondary | ICD-10-CM | POA: Diagnosis not present

## 2016-12-13 DIAGNOSIS — I10 Essential (primary) hypertension: Secondary | ICD-10-CM | POA: Insufficient documentation

## 2016-12-13 DIAGNOSIS — Z09 Encounter for follow-up examination after completed treatment for conditions other than malignant neoplasm: Secondary | ICD-10-CM | POA: Diagnosis not present

## 2016-12-13 DIAGNOSIS — Z23 Encounter for immunization: Secondary | ICD-10-CM

## 2016-12-13 DIAGNOSIS — K219 Gastro-esophageal reflux disease without esophagitis: Secondary | ICD-10-CM | POA: Insufficient documentation

## 2016-12-13 MED ORDER — ASPIRIN EC 81 MG PO TBEC: 81.0000 mg | DELAYED_RELEASE_TABLET | Freq: Every day | ORAL | 11 refills | Status: DC

## 2016-12-13 MED ORDER — POLYETHYLENE GLYCOL 3350 17 GM/SCOOP PO POWD: 17.0000 g | Freq: Every day | ORAL | 0 refills | Status: DC | PRN

## 2016-12-13 MED ORDER — ATORVASTATIN CALCIUM 10 MG PO TABS: 10.0000 mg | ORAL_TABLET | Freq: Every day | ORAL | 3 refills | Status: AC

## 2016-12-13 MED ORDER — LISINOPRIL 20 MG PO TABS: 20.0000 mg | ORAL_TABLET | Freq: Every day | ORAL | 3 refills | Status: AC

## 2016-12-13 MED ORDER — OMEPRAZOLE 40 MG PO CPDR: 40.0000 mg | DELAYED_RELEASE_CAPSULE | Freq: Every day | ORAL | 11 refills | Status: DC | PRN

## 2016-12-13 NOTE — Progress Notes (Signed)
Patient is here for f/up  

## 2016-12-13 NOTE — Progress Notes (Deleted)
   Subjective:  Patient ID: Laura Huerta, female    DOB: 1965-04-29  Age: 51 y.o. MRN: [id number]  CC: Hypertension   HPI Laura Huerta presents for ***  Outpatient Medications Prior to Visit  Medication Sig Dispense Refill  . atorvastatin (LIPITOR) 10 MG tablet Take 1 tablet (10 mg total) by mouth daily. For high Cholesterol 1 tablet 0  . buPROPion (WELLBUTRIN SR) 150 MG 12 hr tablet take 1 tablet by mouth twice a day 60 tablet 2  . docusate sodium (COLACE) 100 MG capsule Take 1 capsule (100 mg total) by mouth 2 (two) times daily. (May purchase from the over the counter): For constipation 1 capsule 0  . FLUoxetine (PROZAC) 20 MG capsule Take 1 capsule (20 mg total) by mouth daily. For depression 30 capsule 0  . fluticasone (FLONASE) 50 MCG/ACT nasal spray Place 2 sprays into both nostrils daily as needed for allergies. For allergies.    Marland Kitchen lisinopril (PRINIVIL,ZESTRIL) 10 MG tablet Take 1 tablet (10 mg total) by mouth daily. For high blood pressure 30 tablet 2  . mirtazapine (REMERON SOL-TAB) 15 MG disintegrating tablet DISSOLVE ONE TABLET ON TONGUE AT BEDTIME FOR SLEEP 30 tablet 0  . nicotine (NICODERM CQ - DOSED IN MG/24 HOURS) 21 mg/24hr patch Place 1 patch (21 mg total) onto the skin daily. For smoking cessation 28 patch 1  . nicotine polacrilex (NICORETTE) 4 MG gum Take 1 each (4 mg total) by mouth as needed for smoking cessation. 100 tablet 11  . omeprazole (PRILOSEC) 40 MG capsule Take 1 capsule (40 mg total) by mouth daily. 30 capsule 2  . polyethylene glycol powder (GLYCOLAX/MIRALAX) powder Take 17 g by mouth daily as needed for mild constipation or moderate constipation. 255 g 0   No facility-administered medications prior to visit.     ROS Review of Systems  Review of Systems - {ros [id number]}    Objective:  BP (!) 148/82 (BP Location: Left Arm, Patient Position: Sitting, Cuff Size: Normal)   Pulse 78   Temp 97.9 F (36.6 C) (Oral)   Resp 18   Ht 5\' 3"   (1.6 m)   Wt 163 lb 3.2 oz (74 kg)   LMP 05/11/2011   SpO2 96%   BMI 28.91 kg/m   BP/Weight 12/13/2016 12/07/2016 0/86/5784  Systolic BP 696 295 284  Diastolic BP 82 97 86  Wt. (Lbs) 163.2 155 150.2  BMI 28.91 27.46 25.78  Some encounter information is confidential and restricted. Go to Review Flowsheets activity to see all data.     Physical Exam   Assessment & Plan:   Problem List Items Addressed This Visit    None      No orders of the defined types were placed in this encounter.   Follow-up: No Follow-up on file.   Alfonse Spruce FNP

## 2016-12-13 NOTE — Patient Instructions (Addendum)
Schedule walk in visit for lab draw tomorrow.  Angina Pectoris Angina pectoris is a very bad feeling in the chest, neck, or arm. Your doctor may call it angina. There are four types of angina. Angina is caused by a lack of blood in the middle and thickest layer of the heart wall (myocardium). Angina may feel like a crushing or squeezing pain in the chest. It may feel like tightness or heavy pressure in the chest. Some people say it feels like gas, heartburn, or indigestion. Some people have symptoms other than pain. These include:  Shortness of breath.  Cold sweats.  Feeling sick to your stomach (nausea).  Feeling light-headed.  Many women have chest discomfort and some of the other symptoms. However, women often have different symptoms, such as:  Feeling tired (fatigue).  Feeling nervous for no reason.  Feeling weak for no reason.  Dizziness or fainting.  Women may have angina without any symptoms. Follow these instructions at home:  Take medicines only as told by your doctor.  Take care of other health issues as told by your doctor. These include: ? High blood pressure (hypertension). ? Diabetes.  Follow a heart-healthy diet. Your doctor can help you to choose healthy food options and make changes.  Talk to your doctor to learn more about healthy cooking methods and use them. These include: ? Roasting. ? Grilling. ? Broiling. ? Baking. ? Poaching. ? Steaming. ? Stir-frying.  Follow an exercise program approved by your doctor.  Keep a healthy weight. Lose weight as told by your doctor.  Rest when you are tired.  Learn to manage stress.  Do not use any tobacco, such as cigarettes, chewing tobacco, or electronic cigarettes. If you need help quitting, ask your doctor.  If you drink alcohol, and your doctor says it is okay, limit yourself to no more than 1 drink per day. One drink equals 12 ounces of beer, 5 ounces of wine, or 1 ounces of hard liquor.  Stop  illegal drug use.  Keep all follow-up visits as told by your doctor. This is important. Do not take these medicines unless your doctor says that you can:  Nonsteroidal anti-inflammatory drugs (NSAIDs). These include: ? Ibuprofen. ? Naproxen. ? Celecoxib.  Vitamin supplements that have vitamin A, vitamin E, or both.  Hormone therapy that contains estrogen with or without progestin.  Get help right away if:  You have pain in your chest, neck, arm, jaw, stomach, or back that: ? Lasts more than a few minutes. ? Comes back. ? Does not get better after you take medicine under your tongue (sublingual nitroglycerin).  You have any of these symptoms for no reason: ? Gas, heartburn, or indigestion. ? Sweating a lot. ? Shortness of breath or trouble breathing. ? Feeling sick to your stomach or throwing up. ? Feeling more tired than usual. ? Feeling nervous or worrying more than usual. ? Feeling weak. ? Diarrhea.  You are suddenly dizzy or light-headed.  You faint or pass out. These symptoms may be an emergency. Do not wait to see if the symptoms will go away. Get medical help right away. Call your local emergency services (911 in the [place] Do not drive yourself to the hospital. This information is not intended to replace advice given to you by your health care provider. Make sure you discuss any questions you have with your health care provider. Document Released: 07/12/2007 Document Revised: 07/01/2015 Document Reviewed: 05/27/2013 Elsevier Interactive Patient Education  2017 [employer].

## 2016-12-14 ENCOUNTER — Telehealth: Payer: Self-pay | Admitting: Neurology

## 2016-12-14 ENCOUNTER — Ambulatory Visit: Payer: Medicaid Other | Attending: Family Medicine

## 2016-12-14 DIAGNOSIS — R9431 Abnormal electrocardiogram [ECG] [EKG]: Secondary | ICD-10-CM | POA: Insufficient documentation

## 2016-12-14 NOTE — Progress Notes (Signed)
Patient here for lab visit only 

## 2016-12-14 NOTE — Telephone Encounter (Signed)
error 

## 2016-12-15 ENCOUNTER — Telehealth: Payer: Self-pay | Admitting: *Deleted

## 2016-12-15 LAB — BASIC METABOLIC PANEL
BUN/Creatinine Ratio: 8 — ABNORMAL LOW (ref 9–23)
BUN: 6 mg/dL (ref 6–24)
CHLORIDE: 102 mmol/L (ref 96–106)
CO2: 25 mmol/L (ref 20–29)
CREATININE: 0.78 mg/dL (ref 0.57–1.00)
Calcium: 9.8 mg/dL (ref 8.7–10.2)
GFR calc Af Amer: 100 mL/min/{1.73_m2} (ref >59)
GFR calc non Af Amer: 87 mL/min/{1.73_m2} (ref >59)
GLUCOSE: 84 mg/dL (ref 65–99)
POTASSIUM: 4.3 mmol/L (ref 3.5–5.2)
SODIUM: 143 mmol/L (ref 134–144)

## 2016-12-15 LAB — CBC
HEMOGLOBIN: 13 g/dL (ref 11.1–15.9)
Hematocrit: 40.6 % (ref 34.0–46.6)
MCH: 23.8 pg — AB (ref 26.6–33.0)
MCHC: 32 g/dL (ref 31.5–35.7)
MCV: 74 fL — ABNORMAL LOW (ref 79–97)
PLATELETS: 266 10*3/uL (ref 150–379)
RBC: 5.46 x10E6/uL — AB (ref 3.77–5.28)
RDW: 16.1 % — ABNORMAL HIGH (ref 12.3–15.4)
WBC: 6.3 10*3/uL (ref 3.4–10.8)

## 2016-12-15 NOTE — Telephone Encounter (Signed)
Pt medical records @ front desk for p/u

## 2016-12-22 NOTE — Progress Notes (Signed)
Subjective:  Patient ID: Laura Huerta, female    DOB: 10-29-1965  Age: 51 y.o. MRN: [id number]  CC: Hypertension   HPI Laura Huerta presents for follow up and medication refills. PMH of HTN,HLD, and Depression.  History of hypertension/ HLD. She is not exercising and is not adherent to low salt diet.  She does at check BP at home. Cardiac symptoms none and chest tightness. Onset 2 weeks ago. Non-radiating. Patient denies chest pain, dyspnea, palpitations, lower extremity edema, near-syncope, syncope, or hemoptysis. She denies any recent history of prolonged travel.  Cardiovascular risk factors: dyslipidemia, hypertension, sedentary lifestyle and smoking/ tobacco exposure. Use of agents associated with hypertension: none. History of target organ damage: left ventricular hypertrophy. 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  She denies current suicidal and homicidal plan or intent.  Possible organic causes contributing are: none.  Risk factors: previous episode of depression and financial constraints. Previous treatment includes Prozac and Remeron and individual therapy and hospitalization. She complains of the following side effects from the treatment: none. She reports still taking Wellbutrin with other treatments. She reports seeing her psychiatrist regularly.   Outpatient Medications Prior to Visit  Medication Sig Dispense Refill  . buPROPion (WELLBUTRIN SR) 150 MG 12 hr tablet take 1 tablet by mouth twice a day 60 tablet 2  . docusate sodium (COLACE) 100 MG capsule Take 1 capsule (100 mg total) by mouth 2 (two) times daily. (May purchase from the over the counter): For constipation 1 capsule 0  . FLUoxetine (PROZAC) 20 MG capsule Take 1 capsule (20 mg total) by mouth daily. For depression 30 capsule 0  . fluticasone (FLONASE) 50 MCG/ACT nasal spray Place 2 sprays into both nostrils daily as needed for allergies. For allergies.    . mirtazapine (REMERON SOL-TAB) 15 MG  disintegrating tablet DISSOLVE ONE TABLET ON TONGUE AT BEDTIME FOR SLEEP 30 tablet 0  . nicotine (NICODERM CQ - DOSED IN MG/24 HOURS) 21 mg/24hr patch Place 1 patch (21 mg total) onto the skin daily. For smoking cessation 28 patch 1  . nicotine polacrilex (NICORETTE) 4 MG gum Take 1 each (4 mg total) by mouth as needed for smoking cessation. 100 tablet 11  . atorvastatin (LIPITOR) 10 MG tablet Take 1 tablet (10 mg total) by mouth daily. For high Cholesterol 1 tablet 0  . lisinopril (PRINIVIL,ZESTRIL) 10 MG tablet Take 1 tablet (10 mg total) by mouth daily. For high blood pressure 30 tablet 2  . omeprazole (PRILOSEC) 40 MG capsule Take 1 capsule (40 mg total) by mouth daily. 30 capsule 2  . polyethylene glycol powder (GLYCOLAX/MIRALAX) powder Take 17 g by mouth daily as needed for mild constipation or moderate constipation. 255 g 0   No facility-administered medications prior to visit.     ROS Review of Systems  Constitutional: Negative.   Respiratory: Negative.   Cardiovascular:       Chest tightness  Gastrointestinal: Negative.   Psychiatric/Behavioral: Negative for self-injury, sleep disturbance and suicidal ideas.       History of depression    Objective:  BP (!) 151/97   Pulse 78   Temp 97.9 F (36.6 C) (Oral)   Resp 18   Ht 5\' 3"  (1.6 m)   Wt 163 lb 3.2 oz (74 kg)   LMP 05/11/2011   SpO2 96%   BMI 28.91 kg/m   BP/Weight 12/13/2016 12/07/2016 02/12/2692  Systolic BP 854 627 035  Diastolic BP 97 97 86  Wt. (Lbs) 163.2  155 150.2  BMI 28.91 27.46 25.78  Some encounter information is confidential and restricted. Go to Review Flowsheets activity to see all data.    Physical Exam  Constitutional: She appears well-developed and well-nourished.  HENT:  Head: Normocephalic and atraumatic.  Right Ear: External ear normal.  Left Ear: External ear normal.  Nose: Nose normal.  Mouth/Throat: Oropharynx is clear and moist.  Eyes: Conjunctivae are normal. Pupils are equal, round,  and reactive to light.  Neck: No JVD present.  Cardiovascular: Normal rate, regular rhythm, normal heart sounds and intact distal pulses.  Pulmonary/Chest: Effort normal and breath sounds normal.  Abdominal: Soft. Bowel sounds are normal. There is no tenderness.  Skin: Skin is warm and dry.  Psychiatric: She expresses no homicidal and no suicidal ideation. She expresses no suicidal plans and no homicidal plans.  Nursing note and vitals reviewed.  Assessment & Plan:   1. Constipation, unspecified constipation type Pt.requests miralax refill - polyethylene glycol powder (GLYCOLAX/MIRALAX) powder; Take 17 g daily as needed by mouth for mild constipation or moderate constipation.  Dispense: 255 g; Refill: 0  2. Chest tightness 2 week history suspect cardiac etiology will obtain EKG and labs. ED precautions discussed with patient. - EKG 12-Lead - CBC; Future - Basic metabolic panel; Future - aspirin EC 81 MG tablet; Take 1 tablet (81 mg total) daily by mouth.  Dispense: 30 tablet; Refill: 11  3. Essential hypertension  - lisinopril (PRINIVIL,ZESTRIL) 20 MG tablet; Take 1 tablet (20 mg total) daily by mouth. For high blood pressure  Dispense: 90 tablet; Refill: 3  4. Gastroesophageal reflux disease, esophagitis presence not specified Medication refill - omeprazole (PRILOSEC) 40 MG capsule; Take 1 capsule (40 mg total) daily as needed by mouth.  Dispense: 30 capsule; Refill: 11  5. Abnormal ECG Referral for further workup. - EKG 12-Lead - Ambulatory referral to Cardiology - CBC; Future - Basic metabolic panel; Future  6. Follow up  - atorvastatin (LIPITOR) 10 MG tablet; Take 1 tablet (10 mg total) daily by mouth. For high Cholesterol  Dispense: 90 tablet; Refill: 3  7. Healthcare maintenance  - Tdap vaccine greater than or equal to 7yo IM   Follow-up: Return in about 3 months (around 03/15/2017), or if symptoms worsen or fail to improve, for HTN/HLD.   Alfonse Spruce  FNP

## 2016-12-26 ENCOUNTER — Telehealth: Payer: Self-pay

## 2016-12-26 NOTE — Telephone Encounter (Signed)
CMA call regarding lab results   Patient did not answer but left a detailed message & to call back if have any questions  

## 2016-12-26 NOTE — Telephone Encounter (Signed)
-----   Message from Alfonse Spruce, [place] sent at 12/22/2016  5:19 PM EST ----- Labs that evaluated your blood cells, fluid and electrolyte balance are normal. No signs of anemia present. Recommend she quit smoking.

## 2016-12-26 NOTE — Telephone Encounter (Signed)
This encounter was created in error - please disregard.

## 2016-12-27 ENCOUNTER — Encounter: Payer: Self-pay | Admitting: Cardiovascular Disease

## 2017-01-04 NOTE — Progress Notes (Deleted)
Cardiology Office Note   Date:  01/04/2017   ID:  Laura Huerta, DOB February 19, 1965, MRN [id number]  PCP:  Alfonse Spruce, FNP  Cardiologist:   Hochrein   No chief complaint on file.     History of Present Illness: Laura Huerta is a 51 y.o. female who presents for consultation regarding abnormal ECG Referred By [name] FNP She has HTN, HLD and Depression. She is obese, sedentary with poor diet. Reviewed primary office note 12/13/16 and patient complained of SSCP beginning of November. History of IVC May 2018 suicide attempt.   Seen by Dr Percival Spanish ECG noted to be chronically abnormal Myovue totally normal EF 65% Echo 03/30/16 done by primary for cardiomegaly and abnormal ECG  EF 55-60% no LVH grade one diastolic  Mild AR/ Mild MR estimated PA 38 mmHg      Past Medical History:  Diagnosis Date  . Abnormality of gait 11/18/2014  . Acid reflux   . Anxiety   . Bronchitis   . Depression   . Hyperlipidemia   . Hypertension     Past Surgical History:  Procedure Laterality Date  . BREAST EXCISIONAL BIOPSY Left 11/02/2003  . CESAREAN SECTION    . HEMORRHOID BANDING  2015  . TUBAL LIGATION       Current Outpatient Medications  Medication Sig Dispense Refill  . aspirin EC 81 MG tablet Take 1 tablet (81 mg total) daily by mouth. 30 tablet 11  . atorvastatin (LIPITOR) 10 MG tablet Take 1 tablet (10 mg total) daily by mouth. For high Cholesterol 90 tablet 3  . buPROPion (WELLBUTRIN SR) 150 MG 12 hr tablet take 1 tablet by mouth twice a day 60 tablet 2  . docusate sodium (COLACE) 100 MG capsule Take 1 capsule (100 mg total) by mouth 2 (two) times daily. (May purchase from the over the counter): For constipation 1 capsule 0  . FLUoxetine (PROZAC) 20 MG capsule Take 1 capsule (20 mg total) by mouth daily. For depression 30 capsule 0  . fluticasone (FLONASE) 50 MCG/ACT nasal spray Place 2 sprays into both nostrils daily as needed for allergies. For  allergies.    Marland Kitchen lisinopril (PRINIVIL,ZESTRIL) 20 MG tablet Take 1 tablet (20 mg total) daily by mouth. For high blood pressure 90 tablet 3  . mirtazapine (REMERON SOL-TAB) 15 MG disintegrating tablet DISSOLVE ONE TABLET ON TONGUE AT BEDTIME FOR SLEEP 30 tablet 0  . nicotine (NICODERM CQ - DOSED IN MG/24 HOURS) 21 mg/24hr patch Place 1 patch (21 mg total) onto the skin daily. For smoking cessation 28 patch 1  . nicotine polacrilex (NICORETTE) 4 MG gum Take 1 each (4 mg total) by mouth as needed for smoking cessation. 100 tablet 11  . omeprazole (PRILOSEC) 40 MG capsule Take 1 capsule (40 mg total) daily as needed by mouth. 30 capsule 11  . polyethylene glycol powder (GLYCOLAX/MIRALAX) powder Take 17 g daily as needed by mouth for mild constipation or moderate constipation. 255 g 0   No current facility-administered medications for this visit.     Allergies:   Patient has no known allergies.    Social History:  The patient  reports that she has been smoking cigarettes.  She has a 20.00 pack-year smoking history. she has never used smokeless tobacco. She reports that she drinks alcohol. She reports that she does not use drugs.   Family History:  The patient's family history includes Asthma in her mother; Breast cancer in her maternal aunt; COPD in  her mother; Depression in her mother; Diabetes in her father and mother; Heart failure in her mother; Hypertension in her father and mother.    ROS:  Please see the history of present illness.   Otherwise, review of systems are positive for none.   All other systems are reviewed and negative.    PHYSICAL EXAM: VS:  LMP 05/11/2011  , BMI There is no height or weight on file to calculate BMI. Affect appropriate Healthy:  appears stated age 78: normal Neck supple with no adenopathy JVP normal no bruits no thyromegaly Lungs clear with no wheezing and good diaphragmatic motion Heart:  S1/S2 no murmur, no rub, gallop or click PMI normal Abdomen:  benighn, BS positve, no tenderness, no AAA no bruit.  No HSM or HJR Distal pulses intact with no bruits No edema Neuro non-focal Skin warm and dry No muscular weakness    EKG: 07/04/16 SR insignificant q waves inferior leads poor R wave progression nonspecific ST changes    Recent Labs: 07/03/2016: ALT 8; Magnesium 2.1 07/06/2016: TSH 1.934 12/14/2016: BUN 6; Creatinine, Ser 0.78; Hemoglobin 13.0; Platelets 266; Potassium 4.3; Sodium 143    Lipid Panel    Component Value Date/Time   CHOL 120 03/23/2016 1513   TRIG 112 03/23/2016 1513   HDL 44 (L) 03/23/2016 1513   CHOLHDL 2.7 03/23/2016 1513   VLDL 22 03/23/2016 1513   LDLCALC 54 03/23/2016 1513      Wt Readings from Last 3 Encounters:  12/13/16 163 lb 3.2 oz (74 kg)  12/07/16 155 lb (70.3 kg)  08/03/16 150 lb 3.2 oz (68.1 kg)      Other studies Reviewed: Additional studies/ records that were reviewed today include: notes from primary , labs ECG .    ASSESSMENT AND PLAN:  1.  Chest Pain 2. Abnormal ECG 3. HTN 4. HLD 5. Depression    Current medicines are reviewed at length with the patient today.  The patient does not have concerns regarding medicines.  The following changes have been made:  no change  Labs/ tests ordered today include: *** No orders of the defined types were placed in this encounter.    Disposition:   FU with cardiology PRN      Signed, Jenkins Rouge, MD  01/04/2017 11:46 AM    [address], [place], [place]  [address] Phone: [phone]; Fax: [phone]

## 2017-01-05 ENCOUNTER — Ambulatory Visit: Payer: Self-pay | Admitting: Cardiovascular Disease

## 2017-01-08 ENCOUNTER — Ambulatory Visit: Payer: Self-pay | Admitting: Cardiovascular Disease

## 2017-01-11 ENCOUNTER — Encounter: Payer: Self-pay | Admitting: Cardiovascular Disease

## 2017-01-16 ENCOUNTER — Other Ambulatory Visit: Payer: Self-pay | Admitting: Internal Medicine

## 2017-01-16 ENCOUNTER — Other Ambulatory Visit: Payer: Self-pay | Admitting: Family Medicine

## 2017-01-16 DIAGNOSIS — K219 Gastro-esophageal reflux disease without esophagitis: Secondary | ICD-10-CM

## 2017-01-17 NOTE — Telephone Encounter (Signed)
CMA call regarding [facility]  Patient did not answer but left a VM stating the reason of the call &  to call me back if have any questions

## 2017-02-20 ENCOUNTER — Other Ambulatory Visit: Payer: Self-pay | Admitting: Family Medicine

## 2017-02-20 DIAGNOSIS — K219 Gastro-esophageal reflux disease without esophagitis: Secondary | ICD-10-CM

## 2017-03-02 ENCOUNTER — Other Ambulatory Visit: Payer: Self-pay | Admitting: Family Medicine

## 2017-03-02 DIAGNOSIS — K219 Gastro-esophageal reflux disease without esophagitis: Secondary | ICD-10-CM

## 2017-03-13 DIAGNOSIS — F332 Major depressive disorder, recurrent severe without psychotic features: Secondary | ICD-10-CM | POA: Diagnosis not present

## 2017-03-13 DIAGNOSIS — F5105 Insomnia due to other mental disorder: Secondary | ICD-10-CM | POA: Diagnosis not present

## 2017-03-13 DIAGNOSIS — F4321 Adjustment disorder with depressed mood: Secondary | ICD-10-CM | POA: Diagnosis not present

## 2017-03-24 ENCOUNTER — Other Ambulatory Visit: Payer: Self-pay | Admitting: Family Medicine

## 2017-03-24 DIAGNOSIS — K219 Gastro-esophageal reflux disease without esophagitis: Secondary | ICD-10-CM

## 2017-04-11 DIAGNOSIS — F5105 Insomnia due to other mental disorder: Secondary | ICD-10-CM | POA: Diagnosis not present

## 2017-04-11 DIAGNOSIS — F332 Major depressive disorder, recurrent severe without psychotic features: Secondary | ICD-10-CM | POA: Diagnosis not present

## 2017-04-11 DIAGNOSIS — F4321 Adjustment disorder with depressed mood: Secondary | ICD-10-CM | POA: Diagnosis not present

## 2017-09-13 DIAGNOSIS — Z5181 Encounter for therapeutic drug level monitoring: Secondary | ICD-10-CM | POA: Diagnosis not present

## 2017-09-13 DIAGNOSIS — I10 Essential (primary) hypertension: Secondary | ICD-10-CM | POA: Diagnosis not present

## 2017-09-13 DIAGNOSIS — Z01118 Encounter for examination of ears and hearing with other abnormal findings: Secondary | ICD-10-CM | POA: Diagnosis not present

## 2017-09-13 DIAGNOSIS — E782 Mixed hyperlipidemia: Secondary | ICD-10-CM | POA: Diagnosis not present

## 2017-09-13 DIAGNOSIS — Z136 Encounter for screening for cardiovascular disorders: Secondary | ICD-10-CM | POA: Diagnosis not present

## 2017-09-13 DIAGNOSIS — Z113 Encounter for screening for infections with a predominantly sexual mode of transmission: Secondary | ICD-10-CM | POA: Diagnosis not present

## 2017-09-13 DIAGNOSIS — Z131 Encounter for screening for diabetes mellitus: Secondary | ICD-10-CM | POA: Diagnosis not present

## 2017-09-13 DIAGNOSIS — Z720 Tobacco use: Secondary | ICD-10-CM | POA: Diagnosis not present

## 2017-09-13 DIAGNOSIS — H538 Other visual disturbances: Secondary | ICD-10-CM | POA: Diagnosis not present

## 2017-09-13 DIAGNOSIS — G4709 Other insomnia: Secondary | ICD-10-CM | POA: Diagnosis not present

## 2017-09-13 DIAGNOSIS — K219 Gastro-esophageal reflux disease without esophagitis: Secondary | ICD-10-CM | POA: Diagnosis not present

## 2017-09-13 DIAGNOSIS — F418 Other specified anxiety disorders: Secondary | ICD-10-CM | POA: Diagnosis not present

## 2017-09-20 DIAGNOSIS — G4709 Other insomnia: Secondary | ICD-10-CM | POA: Diagnosis not present

## 2017-09-20 DIAGNOSIS — K219 Gastro-esophageal reflux disease without esophagitis: Secondary | ICD-10-CM | POA: Diagnosis not present

## 2017-09-20 DIAGNOSIS — R7303 Prediabetes: Secondary | ICD-10-CM | POA: Diagnosis not present

## 2017-09-20 DIAGNOSIS — E782 Mixed hyperlipidemia: Secondary | ICD-10-CM | POA: Diagnosis not present

## 2017-09-20 DIAGNOSIS — F418 Other specified anxiety disorders: Secondary | ICD-10-CM | POA: Diagnosis not present

## 2017-09-20 DIAGNOSIS — Z0000 Encounter for general adult medical examination without abnormal findings: Secondary | ICD-10-CM | POA: Diagnosis not present

## 2017-09-20 DIAGNOSIS — Z720 Tobacco use: Secondary | ICD-10-CM | POA: Diagnosis not present

## 2017-09-20 DIAGNOSIS — I10 Essential (primary) hypertension: Secondary | ICD-10-CM | POA: Diagnosis not present

## 2017-10-12 DIAGNOSIS — I10 Essential (primary) hypertension: Secondary | ICD-10-CM | POA: Diagnosis not present

## 2017-10-25 DIAGNOSIS — G4709 Other insomnia: Secondary | ICD-10-CM | POA: Diagnosis not present

## 2017-10-25 DIAGNOSIS — I10 Essential (primary) hypertension: Secondary | ICD-10-CM | POA: Diagnosis not present

## 2017-10-25 DIAGNOSIS — K219 Gastro-esophageal reflux disease without esophagitis: Secondary | ICD-10-CM | POA: Diagnosis not present

## 2017-10-25 DIAGNOSIS — Z720 Tobacco use: Secondary | ICD-10-CM | POA: Diagnosis not present

## 2017-10-25 DIAGNOSIS — E782 Mixed hyperlipidemia: Secondary | ICD-10-CM | POA: Diagnosis not present

## 2017-10-25 DIAGNOSIS — F418 Other specified anxiety disorders: Secondary | ICD-10-CM | POA: Diagnosis not present

## 2017-10-25 DIAGNOSIS — R7303 Prediabetes: Secondary | ICD-10-CM | POA: Diagnosis not present

## 2017-10-30 ENCOUNTER — Other Ambulatory Visit: Payer: Self-pay | Admitting: Internal Medicine

## 2017-10-30 DIAGNOSIS — Z1231 Encounter for screening mammogram for malignant neoplasm of breast: Secondary | ICD-10-CM

## 2017-10-30 DIAGNOSIS — E2839 Other primary ovarian failure: Secondary | ICD-10-CM

## 2017-11-14 DIAGNOSIS — H903 Sensorineural hearing loss, bilateral: Secondary | ICD-10-CM | POA: Diagnosis not present

## 2017-11-14 DIAGNOSIS — H9113 Presbycusis, bilateral: Secondary | ICD-10-CM | POA: Diagnosis not present

## 2017-11-14 DIAGNOSIS — M26609 Unspecified temporomandibular joint disorder, unspecified side: Secondary | ICD-10-CM | POA: Diagnosis not present

## 2017-12-10 DIAGNOSIS — Z720 Tobacco use: Secondary | ICD-10-CM | POA: Diagnosis not present

## 2017-12-10 DIAGNOSIS — H2513 Age-related nuclear cataract, bilateral: Secondary | ICD-10-CM | POA: Diagnosis not present

## 2017-12-10 DIAGNOSIS — K219 Gastro-esophageal reflux disease without esophagitis: Secondary | ICD-10-CM | POA: Diagnosis not present

## 2017-12-10 DIAGNOSIS — G4709 Other insomnia: Secondary | ICD-10-CM | POA: Diagnosis not present

## 2017-12-10 DIAGNOSIS — R7303 Prediabetes: Secondary | ICD-10-CM | POA: Diagnosis not present

## 2017-12-10 DIAGNOSIS — F418 Other specified anxiety disorders: Secondary | ICD-10-CM | POA: Diagnosis not present

## 2017-12-10 DIAGNOSIS — H538 Other visual disturbances: Secondary | ICD-10-CM | POA: Diagnosis not present

## 2017-12-10 DIAGNOSIS — I10 Essential (primary) hypertension: Secondary | ICD-10-CM | POA: Diagnosis not present

## 2017-12-10 DIAGNOSIS — E782 Mixed hyperlipidemia: Secondary | ICD-10-CM | POA: Diagnosis not present

## 2017-12-24 ENCOUNTER — Ambulatory Visit
Admission: RE | Admit: 2017-12-24 | Discharge: 2017-12-24 | Disposition: A | Discharge status: Home / Self Care | Payer: Medicare Other | Source: Ambulatory Visit | Attending: Internal Medicine | Admitting: Internal Medicine

## 2017-12-24 DIAGNOSIS — M85851 Other specified disorders of bone density and structure, right thigh: Secondary | ICD-10-CM | POA: Diagnosis not present

## 2017-12-24 DIAGNOSIS — Z1231 Encounter for screening mammogram for malignant neoplasm of breast: Secondary | ICD-10-CM | POA: Diagnosis not present

## 2017-12-24 DIAGNOSIS — Z780 Asymptomatic menopausal state: Secondary | ICD-10-CM | POA: Diagnosis not present

## 2017-12-24 DIAGNOSIS — E2839 Other primary ovarian failure: Secondary | ICD-10-CM

## 2017-12-25 DIAGNOSIS — F418 Other specified anxiety disorders: Secondary | ICD-10-CM | POA: Diagnosis not present

## 2017-12-25 DIAGNOSIS — I10 Essential (primary) hypertension: Secondary | ICD-10-CM | POA: Diagnosis not present

## 2017-12-25 DIAGNOSIS — Z720 Tobacco use: Secondary | ICD-10-CM | POA: Diagnosis not present

## 2017-12-25 DIAGNOSIS — N3281 Overactive bladder: Secondary | ICD-10-CM | POA: Diagnosis not present

## 2017-12-25 DIAGNOSIS — R7303 Prediabetes: Secondary | ICD-10-CM | POA: Diagnosis not present

## 2017-12-25 DIAGNOSIS — G4709 Other insomnia: Secondary | ICD-10-CM | POA: Diagnosis not present

## 2017-12-25 DIAGNOSIS — E782 Mixed hyperlipidemia: Secondary | ICD-10-CM | POA: Diagnosis not present

## 2017-12-25 DIAGNOSIS — K219 Gastro-esophageal reflux disease without esophagitis: Secondary | ICD-10-CM | POA: Diagnosis not present

## 2018-01-23 DIAGNOSIS — E782 Mixed hyperlipidemia: Secondary | ICD-10-CM | POA: Diagnosis not present

## 2018-01-23 DIAGNOSIS — G629 Polyneuropathy, unspecified: Secondary | ICD-10-CM | POA: Diagnosis not present

## 2018-01-23 DIAGNOSIS — R7303 Prediabetes: Secondary | ICD-10-CM | POA: Diagnosis not present

## 2018-01-23 DIAGNOSIS — F418 Other specified anxiety disorders: Secondary | ICD-10-CM | POA: Diagnosis not present

## 2018-01-23 DIAGNOSIS — G4709 Other insomnia: Secondary | ICD-10-CM | POA: Diagnosis not present

## 2018-01-23 DIAGNOSIS — Z720 Tobacco use: Secondary | ICD-10-CM | POA: Diagnosis not present

## 2018-01-23 DIAGNOSIS — I10 Essential (primary) hypertension: Secondary | ICD-10-CM | POA: Diagnosis not present

## 2018-01-23 DIAGNOSIS — K219 Gastro-esophageal reflux disease without esophagitis: Secondary | ICD-10-CM | POA: Diagnosis not present

## 2018-01-23 DIAGNOSIS — N3281 Overactive bladder: Secondary | ICD-10-CM | POA: Diagnosis not present

## 2018-02-20 DIAGNOSIS — I10 Essential (primary) hypertension: Secondary | ICD-10-CM | POA: Diagnosis not present

## 2018-02-20 DIAGNOSIS — Z720 Tobacco use: Secondary | ICD-10-CM | POA: Diagnosis not present

## 2018-02-20 DIAGNOSIS — R7303 Prediabetes: Secondary | ICD-10-CM | POA: Diagnosis not present

## 2018-02-20 DIAGNOSIS — G4709 Other insomnia: Secondary | ICD-10-CM | POA: Diagnosis not present

## 2018-02-20 DIAGNOSIS — E782 Mixed hyperlipidemia: Secondary | ICD-10-CM | POA: Diagnosis not present

## 2018-02-20 DIAGNOSIS — K219 Gastro-esophageal reflux disease without esophagitis: Secondary | ICD-10-CM | POA: Diagnosis not present

## 2018-02-20 DIAGNOSIS — F418 Other specified anxiety disorders: Secondary | ICD-10-CM | POA: Diagnosis not present

## 2018-02-20 DIAGNOSIS — N3281 Overactive bladder: Secondary | ICD-10-CM | POA: Diagnosis not present

## 2018-02-20 DIAGNOSIS — G629 Polyneuropathy, unspecified: Secondary | ICD-10-CM | POA: Diagnosis not present

## 2018-03-08 IMAGING — CR DG CHEST 2V
2 series · 2 of 2 positions shown · non-contrast
Comparison: 02/25/2015

CLINICAL DATA: Pt c/o central chest pain, SOB, cough, and acute URI
x 1 day. Hx of bronchitis and HTN. Pt is a current smoker.

EXAM:
CHEST  2 VIEW

[chest pa]
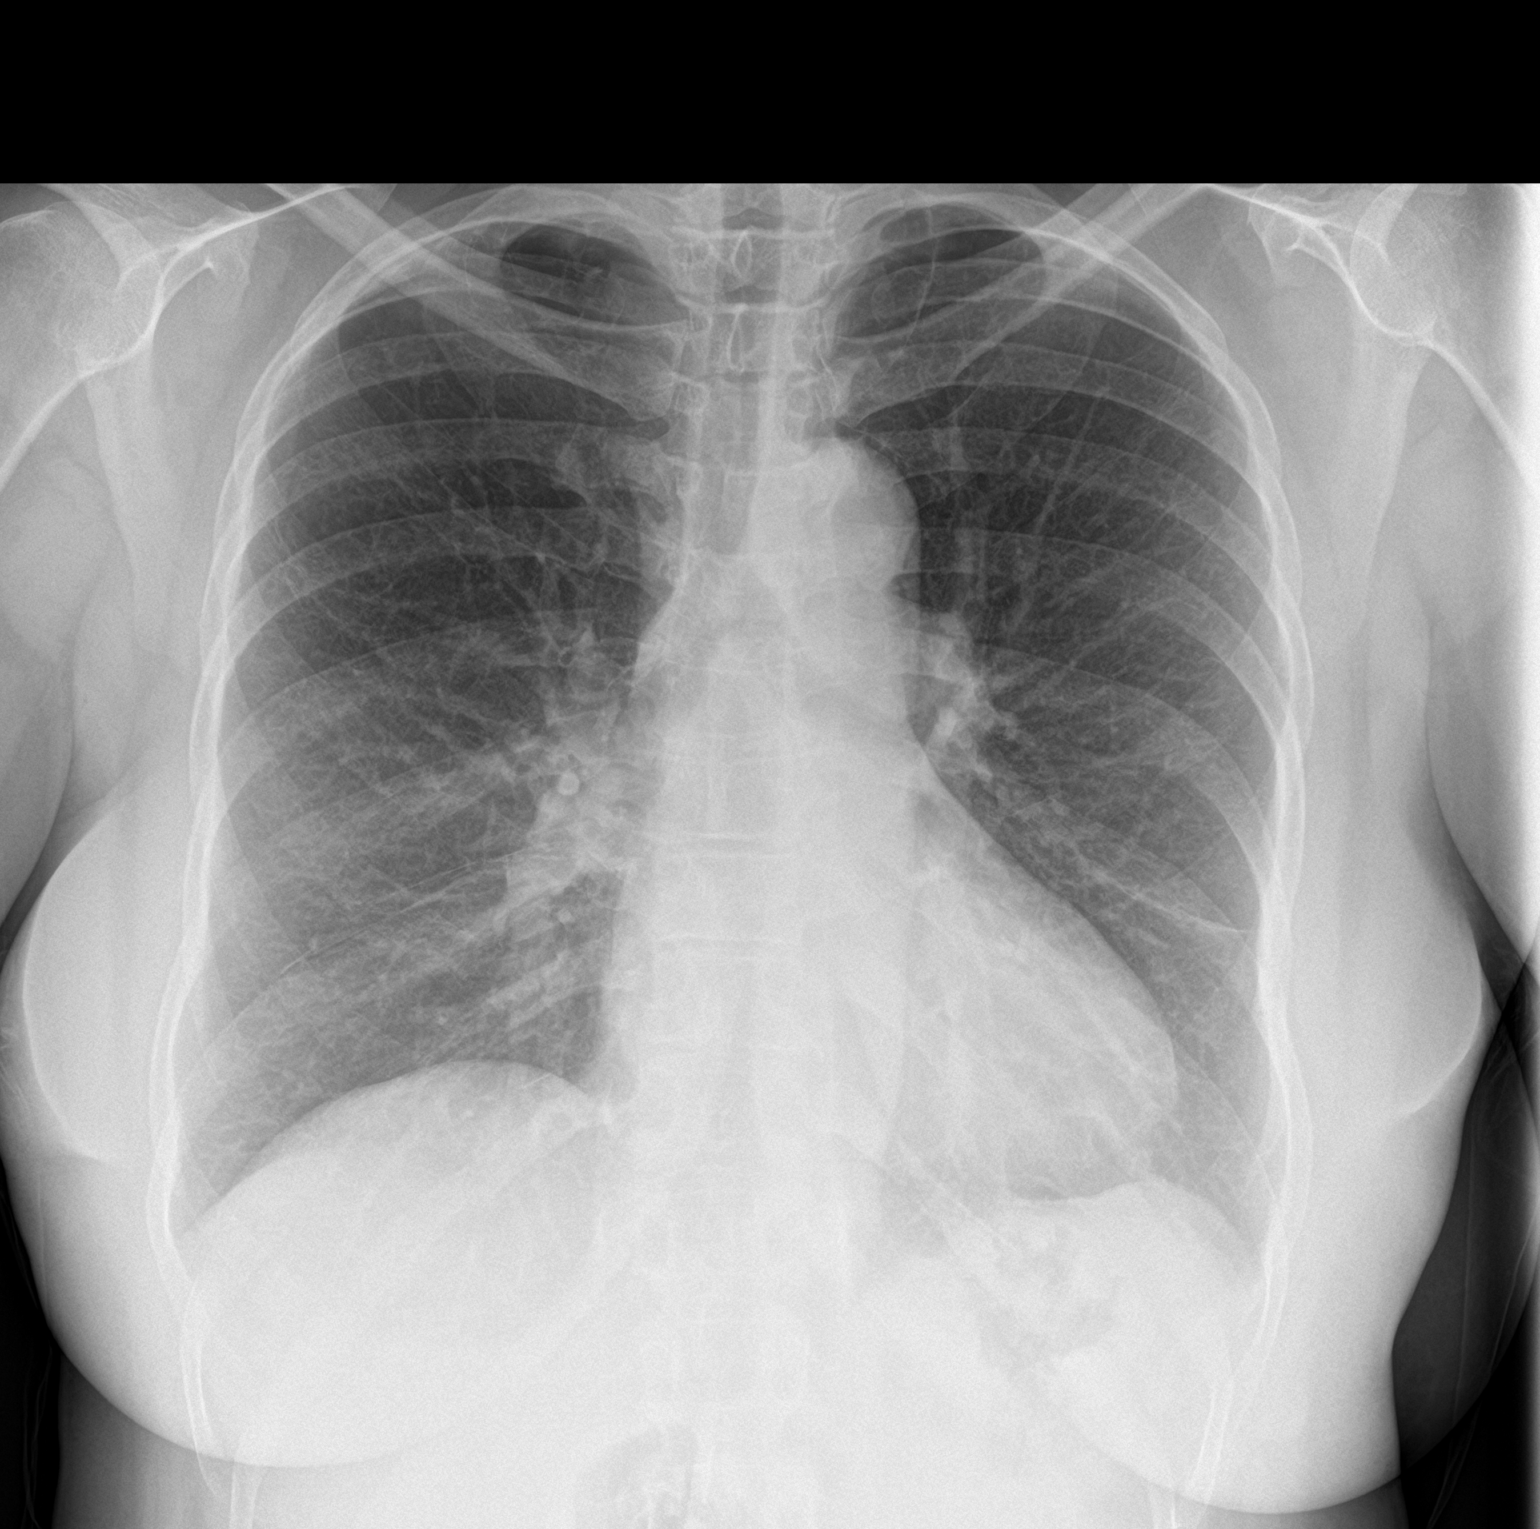

[chest lat]
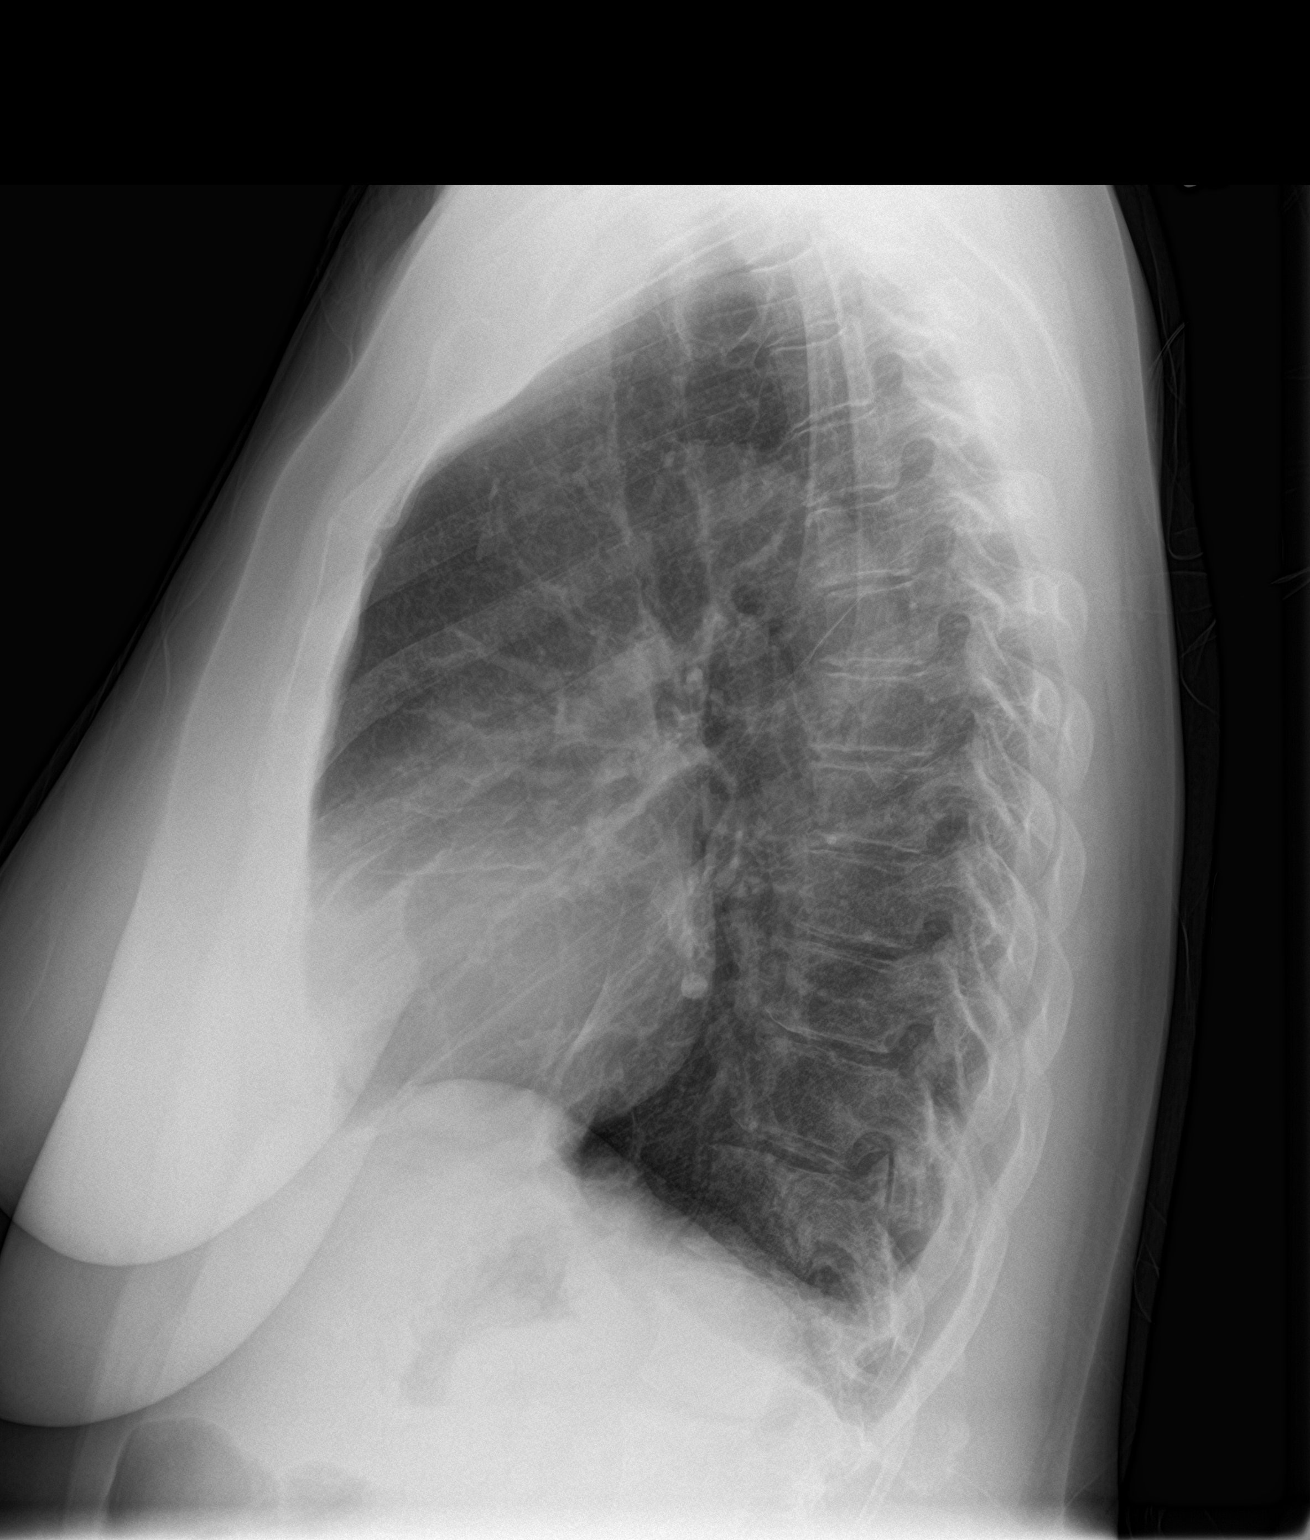

[2 of 2 positions shown; findings below may reference images not displayed]

FINDINGS: Stable lingular scarring. Minimal scarring along the left
hemidiaphragm, unchanged from prior.

The lungs appear otherwise clear.  Mild cardiomegaly.
IMPRESSION: 1. Stable scarring in the left lower lobe and lingula.
2. Mild enlargement of the cardiopericardial silhouette, without
edema.

## 2018-03-14 DIAGNOSIS — G44211 Episodic tension-type headache, intractable: Secondary | ICD-10-CM | POA: Diagnosis not present

## 2018-03-14 DIAGNOSIS — G629 Polyneuropathy, unspecified: Secondary | ICD-10-CM | POA: Diagnosis not present

## 2018-03-14 DIAGNOSIS — Z131 Encounter for screening for diabetes mellitus: Secondary | ICD-10-CM | POA: Diagnosis not present

## 2018-03-14 DIAGNOSIS — I10 Essential (primary) hypertension: Secondary | ICD-10-CM | POA: Diagnosis not present

## 2018-03-14 DIAGNOSIS — E782 Mixed hyperlipidemia: Secondary | ICD-10-CM | POA: Diagnosis not present

## 2018-03-14 DIAGNOSIS — R29898 Other symptoms and signs involving the musculoskeletal system: Secondary | ICD-10-CM | POA: Diagnosis not present

## 2018-03-14 DIAGNOSIS — G4709 Other insomnia: Secondary | ICD-10-CM | POA: Diagnosis not present

## 2018-03-14 DIAGNOSIS — R7303 Prediabetes: Secondary | ICD-10-CM | POA: Diagnosis not present

## 2018-03-14 DIAGNOSIS — N3281 Overactive bladder: Secondary | ICD-10-CM | POA: Diagnosis not present

## 2018-03-14 DIAGNOSIS — K219 Gastro-esophageal reflux disease without esophagitis: Secondary | ICD-10-CM | POA: Diagnosis not present

## 2018-03-14 DIAGNOSIS — F418 Other specified anxiety disorders: Secondary | ICD-10-CM | POA: Diagnosis not present

## 2018-03-14 DIAGNOSIS — Z720 Tobacco use: Secondary | ICD-10-CM | POA: Diagnosis not present

## 2018-04-10 DIAGNOSIS — N898 Other specified noninflammatory disorders of vagina: Secondary | ICD-10-CM | POA: Diagnosis not present

## 2018-04-10 DIAGNOSIS — Z01419 Encounter for gynecological examination (general) (routine) without abnormal findings: Secondary | ICD-10-CM | POA: Diagnosis not present

## 2018-05-27 ENCOUNTER — Telehealth: Payer: Self-pay

## 2018-05-27 NOTE — Telephone Encounter (Signed)
I contacted the pt and left a vm on her mobile device in regards to her 05/28/18/. Due to current COVID 19 pandemic, our office is severely reducing in office visits for at least the next 2 weeks, in order to minimize the risk to our patients and healthcare providers.   I am trying to verify if we could convert her appt to a virtual video visit.

## 2018-05-27 NOTE — Telephone Encounter (Signed)
Requested a call back x2 to discuss message stated above.

## 2018-05-28 ENCOUNTER — Institutional Professional Consult (permissible substitution): Payer: Medicare Other | Admitting: Neurology

## 2018-05-28 NOTE — Telephone Encounter (Signed)
Patient has been removed from the scheduled for 05/28/18 I contacted the pt and requested a call back to discuss a virtual video visit. Will wait for pt to return my call.

## 2018-08-15 DIAGNOSIS — Z131 Encounter for screening for diabetes mellitus: Secondary | ICD-10-CM | POA: Diagnosis not present

## 2018-08-15 DIAGNOSIS — R7303 Prediabetes: Secondary | ICD-10-CM | POA: Diagnosis not present

## 2018-08-15 DIAGNOSIS — G44211 Episodic tension-type headache, intractable: Secondary | ICD-10-CM | POA: Diagnosis not present

## 2018-08-15 DIAGNOSIS — Z720 Tobacco use: Secondary | ICD-10-CM | POA: Diagnosis not present

## 2018-08-15 DIAGNOSIS — G4709 Other insomnia: Secondary | ICD-10-CM | POA: Diagnosis not present

## 2018-08-15 DIAGNOSIS — I10 Essential (primary) hypertension: Secondary | ICD-10-CM | POA: Diagnosis not present

## 2018-08-15 DIAGNOSIS — Z01021 Encounter for examination of eyes and vision following failed vision screening with abnormal findings: Secondary | ICD-10-CM | POA: Diagnosis not present

## 2018-08-15 DIAGNOSIS — F418 Other specified anxiety disorders: Secondary | ICD-10-CM | POA: Diagnosis not present

## 2018-08-15 DIAGNOSIS — G629 Polyneuropathy, unspecified: Secondary | ICD-10-CM | POA: Diagnosis not present

## 2018-08-15 DIAGNOSIS — N3281 Overactive bladder: Secondary | ICD-10-CM | POA: Diagnosis not present

## 2018-08-15 DIAGNOSIS — H538 Other visual disturbances: Secondary | ICD-10-CM | POA: Diagnosis not present

## 2018-08-15 DIAGNOSIS — E782 Mixed hyperlipidemia: Secondary | ICD-10-CM | POA: Diagnosis not present

## 2018-08-15 DIAGNOSIS — K219 Gastro-esophageal reflux disease without esophagitis: Secondary | ICD-10-CM | POA: Diagnosis not present

## 2018-09-24 DIAGNOSIS — G44211 Episodic tension-type headache, intractable: Secondary | ICD-10-CM | POA: Diagnosis not present

## 2018-09-24 DIAGNOSIS — G4709 Other insomnia: Secondary | ICD-10-CM | POA: Diagnosis not present

## 2018-09-24 DIAGNOSIS — I10 Essential (primary) hypertension: Secondary | ICD-10-CM | POA: Diagnosis not present

## 2018-09-24 DIAGNOSIS — Z1389 Encounter for screening for other disorder: Secondary | ICD-10-CM | POA: Diagnosis not present

## 2018-09-24 DIAGNOSIS — E782 Mixed hyperlipidemia: Secondary | ICD-10-CM | POA: Diagnosis not present

## 2018-09-24 DIAGNOSIS — R7303 Prediabetes: Secondary | ICD-10-CM | POA: Diagnosis not present

## 2018-09-24 DIAGNOSIS — Z0001 Encounter for general adult medical examination with abnormal findings: Secondary | ICD-10-CM | POA: Diagnosis not present

## 2018-09-24 DIAGNOSIS — K219 Gastro-esophageal reflux disease without esophagitis: Secondary | ICD-10-CM | POA: Diagnosis not present

## 2018-09-24 DIAGNOSIS — F418 Other specified anxiety disorders: Secondary | ICD-10-CM | POA: Diagnosis not present

## 2018-09-24 DIAGNOSIS — N3281 Overactive bladder: Secondary | ICD-10-CM | POA: Diagnosis not present

## 2018-09-24 DIAGNOSIS — Z720 Tobacco use: Secondary | ICD-10-CM | POA: Diagnosis not present

## 2018-09-24 DIAGNOSIS — G629 Polyneuropathy, unspecified: Secondary | ICD-10-CM | POA: Diagnosis not present

## 2018-10-30 DIAGNOSIS — Z720 Tobacco use: Secondary | ICD-10-CM | POA: Diagnosis not present

## 2018-10-30 DIAGNOSIS — G4709 Other insomnia: Secondary | ICD-10-CM | POA: Diagnosis not present

## 2018-10-30 DIAGNOSIS — K219 Gastro-esophageal reflux disease without esophagitis: Secondary | ICD-10-CM | POA: Diagnosis not present

## 2018-10-30 DIAGNOSIS — R7303 Prediabetes: Secondary | ICD-10-CM | POA: Diagnosis not present

## 2018-10-30 DIAGNOSIS — I10 Essential (primary) hypertension: Secondary | ICD-10-CM | POA: Diagnosis not present

## 2018-10-30 DIAGNOSIS — F418 Other specified anxiety disorders: Secondary | ICD-10-CM | POA: Diagnosis not present

## 2018-10-30 DIAGNOSIS — E782 Mixed hyperlipidemia: Secondary | ICD-10-CM | POA: Diagnosis not present

## 2018-10-30 DIAGNOSIS — G629 Polyneuropathy, unspecified: Secondary | ICD-10-CM | POA: Diagnosis not present

## 2018-10-30 DIAGNOSIS — G44211 Episodic tension-type headache, intractable: Secondary | ICD-10-CM | POA: Diagnosis not present

## 2018-10-30 DIAGNOSIS — N3281 Overactive bladder: Secondary | ICD-10-CM | POA: Diagnosis not present

## 2018-11-04 DIAGNOSIS — Z1211 Encounter for screening for malignant neoplasm of colon: Secondary | ICD-10-CM | POA: Diagnosis not present

## 2018-11-04 DIAGNOSIS — K219 Gastro-esophageal reflux disease without esophagitis: Secondary | ICD-10-CM | POA: Diagnosis not present

## 2018-11-04 DIAGNOSIS — K625 Hemorrhage of anus and rectum: Secondary | ICD-10-CM | POA: Diagnosis not present

## 2018-11-19 DIAGNOSIS — F418 Other specified anxiety disorders: Secondary | ICD-10-CM | POA: Diagnosis not present

## 2018-11-19 DIAGNOSIS — G4709 Other insomnia: Secondary | ICD-10-CM | POA: Diagnosis not present

## 2018-11-19 DIAGNOSIS — E782 Mixed hyperlipidemia: Secondary | ICD-10-CM | POA: Diagnosis not present

## 2018-11-19 DIAGNOSIS — K219 Gastro-esophageal reflux disease without esophagitis: Secondary | ICD-10-CM | POA: Diagnosis not present

## 2018-11-19 DIAGNOSIS — I10 Essential (primary) hypertension: Secondary | ICD-10-CM | POA: Diagnosis not present

## 2018-11-19 DIAGNOSIS — Z720 Tobacco use: Secondary | ICD-10-CM | POA: Diagnosis not present

## 2018-11-19 DIAGNOSIS — G629 Polyneuropathy, unspecified: Secondary | ICD-10-CM | POA: Diagnosis not present

## 2018-11-19 DIAGNOSIS — G44211 Episodic tension-type headache, intractable: Secondary | ICD-10-CM | POA: Diagnosis not present

## 2018-11-19 DIAGNOSIS — N3281 Overactive bladder: Secondary | ICD-10-CM | POA: Diagnosis not present

## 2018-11-19 DIAGNOSIS — R7303 Prediabetes: Secondary | ICD-10-CM | POA: Diagnosis not present

## 2018-12-03 DIAGNOSIS — K6389 Other specified diseases of intestine: Secondary | ICD-10-CM | POA: Diagnosis not present

## 2018-12-03 DIAGNOSIS — K5730 Diverticulosis of large intestine without perforation or abscess without bleeding: Secondary | ICD-10-CM | POA: Diagnosis not present

## 2018-12-03 DIAGNOSIS — D120 Benign neoplasm of cecum: Secondary | ICD-10-CM | POA: Diagnosis not present

## 2018-12-03 DIAGNOSIS — K635 Polyp of colon: Secondary | ICD-10-CM | POA: Diagnosis not present

## 2018-12-03 DIAGNOSIS — Z1211 Encounter for screening for malignant neoplasm of colon: Secondary | ICD-10-CM | POA: Diagnosis not present

## 2019-02-13 ENCOUNTER — Other Ambulatory Visit: Payer: Self-pay | Admitting: Internal Medicine

## 2019-02-13 DIAGNOSIS — Z1231 Encounter for screening mammogram for malignant neoplasm of breast: Secondary | ICD-10-CM

## 2019-02-18 ENCOUNTER — Other Ambulatory Visit: Payer: Self-pay

## 2019-02-18 ENCOUNTER — Ambulatory Visit
Admission: RE | Admit: 2019-02-18 | Discharge: 2019-02-18 | Disposition: A | Discharge status: Home / Self Care | Payer: Medicare Other | Source: Ambulatory Visit | Attending: Internal Medicine | Admitting: Internal Medicine

## 2019-02-18 DIAGNOSIS — Z1231 Encounter for screening mammogram for malignant neoplasm of breast: Secondary | ICD-10-CM

## 2019-02-19 ENCOUNTER — Other Ambulatory Visit: Payer: Self-pay | Admitting: Internal Medicine

## 2019-02-19 DIAGNOSIS — R928 Other abnormal and inconclusive findings on diagnostic imaging of breast: Secondary | ICD-10-CM

## 2019-02-21 ENCOUNTER — Other Ambulatory Visit: Payer: Self-pay | Admitting: Family Medicine

## 2019-02-25 ENCOUNTER — Ambulatory Visit
Admission: RE | Admit: 2019-02-25 | Discharge: 2019-02-25 | Disposition: A | Discharge status: Home / Self Care | Payer: Medicare Other | Source: Ambulatory Visit | Attending: Internal Medicine | Admitting: Internal Medicine

## 2019-02-25 ENCOUNTER — Other Ambulatory Visit: Payer: Self-pay

## 2019-02-25 DIAGNOSIS — R928 Other abnormal and inconclusive findings on diagnostic imaging of breast: Secondary | ICD-10-CM

## 2019-02-25 DIAGNOSIS — Z720 Tobacco use: Secondary | ICD-10-CM | POA: Diagnosis not present

## 2019-02-25 DIAGNOSIS — F418 Other specified anxiety disorders: Secondary | ICD-10-CM | POA: Diagnosis not present

## 2019-02-25 DIAGNOSIS — E782 Mixed hyperlipidemia: Secondary | ICD-10-CM | POA: Diagnosis not present

## 2019-02-25 DIAGNOSIS — N6001 Solitary cyst of right breast: Secondary | ICD-10-CM | POA: Diagnosis not present

## 2019-02-25 DIAGNOSIS — G4709 Other insomnia: Secondary | ICD-10-CM | POA: Diagnosis not present

## 2019-02-25 DIAGNOSIS — G44211 Episodic tension-type headache, intractable: Secondary | ICD-10-CM | POA: Diagnosis not present

## 2019-02-25 DIAGNOSIS — R7303 Prediabetes: Secondary | ICD-10-CM | POA: Diagnosis not present

## 2019-02-25 DIAGNOSIS — I10 Essential (primary) hypertension: Secondary | ICD-10-CM | POA: Diagnosis not present

## 2019-02-25 DIAGNOSIS — N3281 Overactive bladder: Secondary | ICD-10-CM | POA: Diagnosis not present

## 2019-02-25 DIAGNOSIS — G629 Polyneuropathy, unspecified: Secondary | ICD-10-CM | POA: Diagnosis not present

## 2019-02-25 DIAGNOSIS — K219 Gastro-esophageal reflux disease without esophagitis: Secondary | ICD-10-CM | POA: Diagnosis not present

## 2019-04-08 DIAGNOSIS — J028 Acute pharyngitis due to other specified organisms: Secondary | ICD-10-CM | POA: Diagnosis not present

## 2019-05-01 ENCOUNTER — Other Ambulatory Visit: Payer: Self-pay

## 2019-05-01 ENCOUNTER — Ambulatory Visit (HOSPITAL_COMMUNITY)
Admission: EM | Admit: 2019-05-01 | Discharge: 2019-05-01 | Disposition: A | Discharge status: Home / Self Care | Payer: Medicare Other | Attending: Emergency Medicine | Admitting: Emergency Medicine

## 2019-05-01 ENCOUNTER — Ambulatory Visit (INDEPENDENT_AMBULATORY_CARE_PROVIDER_SITE_OTHER): Payer: Medicare Other

## 2019-05-01 ENCOUNTER — Encounter (HOSPITAL_COMMUNITY): Payer: Self-pay

## 2019-05-01 DIAGNOSIS — R109 Unspecified abdominal pain: Secondary | ICD-10-CM

## 2019-05-01 DIAGNOSIS — R1030 Lower abdominal pain, unspecified: Secondary | ICD-10-CM | POA: Diagnosis not present

## 2019-05-01 LAB — POCT URINALYSIS DIP (DEVICE)
Bilirubin Urine: NEGATIVE
Glucose, UA: NEGATIVE mg/dL
Ketones, ur: NEGATIVE mg/dL
Leukocytes,Ua: NEGATIVE
Nitrite: NEGATIVE
Protein, ur: NEGATIVE mg/dL
Specific Gravity, Urine: 1.025 (ref 1.005–1.030)
Urobilinogen, UA: 1 mg/dL (ref 0.0–1.0)
pH: 6 (ref 5.0–8.0)

## 2019-05-01 MED ORDER — DOCUSATE SODIUM 100 MG PO CAPS: 100.0000 mg | ORAL_CAPSULE | Freq: Two times a day (BID) | ORAL | 0 refills | Status: AC

## 2019-05-01 MED ORDER — SIMETHICONE 125 MG PO TABS: 1.0000 | ORAL_TABLET | Freq: Four times a day (QID) | ORAL | 0 refills | Status: AC | PRN

## 2019-05-01 MED ORDER — POLYETHYLENE GLYCOL 3350 17 G PO PACK: 17.0000 g | PACK | Freq: Every day | ORAL | 0 refills | Status: AC

## 2019-05-01 NOTE — ED Triage Notes (Signed)
Pt c/o 10/10 sharp pain in pelvic areax3 days. Pt states she vomited once yesterday. Pt denies diarrhea or constipation. Pt last BM was yesterday. Pt states like she keeps feeling she has to have a BM, but nothing comes out, except gas sometimes.

## 2019-05-01 NOTE — ED Provider Notes (Signed)
[facility]    CSN: [id number] Arrival date & time: 05/01/19  1254      History   Chief Complaint Chief Complaint  Patient presents with  . Abdominal Pain    HPI Lanese Graeber is a 54 y.o. female history of hypertension, hyperlipidemia, tobacco use, presenting today for evaluation of abdominal pain.  Patient notes that she has had lower abdominal pain/pelvic pain over the past 3 days.  It has been intermittent, but worsened over the past 24 hours.  Also a sharp pain in her lower abdomen/pelvis.  She denies any associated urinary symptoms of dysuria, increased frequency or urgency.  Denies any pelvic symptoms of vaginal discharge or abnormal bleeding.  Had one episode of vomiting yesterday, but no persistent nausea or vomiting.  Does have worsening discomfort with eating.  Had brief relief with using gas medicine.  Reports approximately 1-2 bowel movements a week.  Recently stools have been smaller amount than normal.  Patient reports prior appendectomy (although not listed in [place]) and prior surgery from what sounds like possible tubal rupture from PID.  Patient denies any specific symptoms of STDs, but does wish to be screened for this. HPI  Past Medical History:  Diagnosis Date  . Abnormality of gait 11/18/2014  . Acid reflux   . Anxiety   . Bronchitis   . Depression   . Hyperlipidemia   . Hypertension     Patient Active Problem List   Diagnosis Date Noted  . 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 Hypertension 03/23/2016  . Hyperlipidemia 03/23/2016  . Depression 03/23/2016  . Shingles 03/12/2015  . Left lower lobe pneumonia 02/04/2015  . Abnormality of gait 11/18/2014  . Rectal pain 09/18/2013  . Internal hemorrhoids 06/05/2013  . Hemorrhage of rectum and anus 04/22/2013  . Personal history of colonic polyps 04/22/2013  . Abnormal EKG 10/10/2012  . CA IN SITU, BREAST 11/30/2006  . TOBACCO ABUSE  11/30/2006  . COCAINE ABUSE 11/30/2006  . GERD (gastroesophageal reflux disease) 11/30/2006  . TENOSYNOVITIS, HAND/WRIST [place] 11/30/2006    Past Surgical History:  Procedure Laterality Date  . BREAST EXCISIONAL BIOPSY Left 11/02/2003  . CESAREAN SECTION    . HEMORRHOID BANDING  2015  . TUBAL LIGATION      OB History    Gravida  7   Para  2   Term  2   Preterm      AB  5   Living  2     SAB      TAB  5   Ectopic      Multiple      Live Births               Home Medications    Prior to Admission medications   Medication Sig Start Date End Date Taking? Authorizing Provider  amLODipine (NORVASC) 5 MG tablet Take 5 mg by mouth daily. 04/21/19   [provider]  atorvastatin (LIPITOR) 10 MG tablet Take 1 tablet (10 mg total) daily by mouth. For high Cholesterol 12/13/16   Alfonse Spruce, FNP  buPROPion (WELLBUTRIN SR) 150 MG 12 hr tablet take 1 tablet by mouth twice a day 09/25/16   Alfonse Spruce, FNP  docusate sodium (COLACE) 100 MG capsule Take 1 capsule (100 mg total) by mouth every 12 (twelve) hours. 05/01/19   Emmaclaire Switala C, PA-C  FLUoxetine (PROZAC) 20 MG capsule Take 1 capsule (20 mg total) by mouth daily.  For depression 07/07/16   Lindell Spar I, NP  gabapentin (NEURONTIN) 300 MG capsule Take 300 mg by mouth 2 (two) times daily. 04/21/19   [provider]  hydrochlorothiazide (HYDRODIURIL) 12.5 MG tablet Take 12.5 mg by mouth daily. 04/21/19   [provider]  hydrOXYzine (ATARAX/VISTARIL) 25 MG tablet Take 25-50 mg by mouth at bedtime. 04/22/19   [provider]  lisinopril (PRINIVIL,ZESTRIL) 20 MG tablet Take 1 tablet (20 mg total) daily by mouth. For high blood pressure 12/13/16   Hairston, Mandesia R, FNP  nicotine (NICODERM CQ - DOSED IN MG/24 HOURS) 21 mg/24hr patch Place 1 patch (21 mg total) onto the skin daily. For smoking cessation 07/06/16   Lindell Spar I, NP  omeprazole (PRILOSEC) 40 MG capsule TAKE 1  CAPSULE BY MOUTH EVERY DAY 03/26/17   Alfonse Spruce, FNP  omeprazole (PRILOSEC) 40 MG capsule Take 40 mg by mouth every morning.    [provider]  oxybutynin (DITROPAN-XL) 10 MG 24 hr tablet Take 10 mg by mouth daily. 04/21/19   [provider]  polyethylene glycol (MIRALAX / GLYCOLAX) 17 g packet Take 17 g by mouth daily. 05/01/19   Crewe Heathman C, PA-C  Simethicone 125 MG TABS Take 1 tablet (125 mg total) by mouth every 6 (six) hours as needed. 05/01/19   Dasean Brow C, PA-C  traZODone (DESYREL) 50 MG tablet TAKE 1 TABLET BY MOUTH AT BEDTIME IF needed FOR SLEEP 01/17/17   Alfonse Spruce, FNP  fluticasone (FLONASE) 50 MCG/ACT nasal spray Place 2 sprays into both nostrils daily as needed for allergies. For allergies. 07/06/16 05/01/19  Lindell Spar I, NP  mirtazapine (REMERON) 30 MG tablet TAKE 1 TABLET BY MOUTH AT BEDTIME 02/20/17 05/01/19  Alfonse Spruce, FNP    Family History Family History  Problem Relation Age of Onset  . Hypertension Mother   . Heart failure Mother        Died age 81  . COPD Mother   . Asthma Mother   . Diabetes Mother   . Depression Mother   . Hypertension Father   . Diabetes Father   . Breast cancer Maternal Aunt     Social History Social History   Tobacco Use  . Smoking status: Current Every Day Smoker    Packs/day: 2.00    Years: 10.00    Pack years: 20.00    Types: Cigarettes  . Smokeless tobacco: Never Used  Substance Use Topics  . Alcohol use: Yes    Alcohol/week: 1.0 standard drinks    Types: 1 Cans of beer per week    Comment: 40 oz  . Drug use: No     Allergies   Patient has no known allergies.   Review of Systems Review of Systems  Constitutional: Negative for fever.  Respiratory: Negative for shortness of breath.   Cardiovascular: Negative for chest pain.  Gastrointestinal: Positive for abdominal pain and vomiting. Negative for diarrhea and nausea.  Genitourinary: Positive for pelvic pain.  Negative for dysuria, flank pain, genital sores, hematuria, menstrual problem, vaginal bleeding, vaginal discharge and vaginal pain.  Musculoskeletal: Negative for back pain.  Skin: Negative for rash.  Neurological: Negative for dizziness, light-headedness and headaches.     Physical Exam Triage Vital Signs ED Triage Vitals  Enc Vitals Group     BP 05/01/19 1338 121/75     Pulse Rate 05/01/19 1338 98     Resp 05/01/19 1338 18     Temp 05/01/19 1338  98.4 F (36.9 C)     Temp Source 05/01/19 1338 Oral     SpO2 05/01/19 1338 94 %     Weight 05/01/19 1339 160 lb (72.6 kg)     Height 05/01/19 1339 5\' 3"  (1.6 m)     Head Circumference --      Peak Flow --      Pain Score 05/01/19 1339 10     Pain Loc --      Pain Edu? --      Excl. in [place]? --    No data found.  Updated Vital Signs BP 121/75   Pulse 98   Temp 98.4 F (36.9 C) (Oral)   Resp 18   Ht 5\' 3"  (1.6 m)   Wt 160 lb (72.6 kg)   LMP 05/11/2011   SpO2 94%   BMI 28.34 kg/m   Visual Acuity Right Eye Distance:   Left Eye Distance:   Bilateral Distance:    Right Eye Near:   Left Eye Near:    Bilateral Near:     Physical Exam Vitals and nursing note reviewed.  Constitutional:      Appearance: She is well-developed.     Comments: No acute distress  HENT:     Head: Normocephalic and atraumatic.     Nose: Nose normal.  Eyes:     Conjunctiva/sclera: Conjunctivae normal.  Cardiovascular:     Rate and Rhythm: Normal rate.  Pulmonary:     Effort: Pulmonary effort is normal. No respiratory distress.     Comments: Breathing comfortably at rest, CTABL, no wheezing, rales or other adventitious sounds auscultated  Abdominal:     General: There is no distension.     Comments: Soft, nondistended, nontender to palpation to bilateral upper quadrants and epigastrium, tender to palpation to lower abdomen and over pubic area, more tender in middle and towards the left.  Negative McBurney's.  Genitourinary:    Comments:  Normal external female genitalia, vaginal mucosa pink, mild amount of white discharge present, cervix pink, no bleeding, no cervical motion tenderness, no adnexal masses palpated Musculoskeletal:        General: Normal range of motion.     Cervical back: Neck supple.  Skin:    General: Skin is warm and dry.  Neurological:     Mental Status: She is alert and oriented to person, place, and time.      UC Treatments / Results  Labs (all labs ordered are listed, but only abnormal results are displayed) Labs Reviewed  POCT URINALYSIS DIP (DEVICE) - Abnormal; Notable for the following components:      Result Value   Hgb urine dipstick TRACE (*)    All other components within normal limits  CERVICOVAGINAL ANCILLARY ONLY    EKG   Radiology DG Abd 2 Views  Result Date: 05/01/2019 CLINICAL DATA:  Increasing abdominal pain over the past 3 days EXAM: ABDOMEN - 2 VIEW COMPARISON:  04/08/2014 FINDINGS: Scattered large and small bowel gas is noted. Mild retained fecal material is seen without obstructive change. No free air is noted. No abnormal mass or abnormal calcifications are seen. Mild degenerative changes of lumbar spine are noted. IMPRESSION: No acute abnormality seen. Electronically Signed   By: Inez Catalina M.D.   On: 05/01/2019 14:28    Procedures Procedures (including critical care time)  Medications Ordered in UC Medications - No data to display  Initial Impression / Assessment and Plan / UC Course  I have reviewed the triage  vital signs and the nursing notes.  Pertinent labs & imaging results that were available during my care of the patient were reviewed by me and considered in my medical decision making (see chart for details).     UA with negative leuks and nitrites, not suggestive of UTI.  Vaginal swab pending to screen for STDs as possible causes of abdominal/pelvic pain.  At this time given history and correlation with abdominal x-ray we will go ahead and treat for  gas/constipation with continued use of simethicone, adding in Colace and MiraLAX to use to help have more frequent bowels.  Monitor for improvement in pain with passing of gas/stool.  Symptoms progressing or worsening to follow-up in emergency room.  If symptoms persisting to follow-up with PCP/OB/GYN, possible vaginal ultrasound to further evaluate pelvic organs.  Discussed strict return precautions. Patient verbalized understanding and is agreeable with plan.  Final Clinical Impressions(s) / UC Diagnoses   Final diagnoses:  Lower abdominal pain     Discharge Instructions     Your urine did not show any signs of infection Vaginal swab results pending, we will call if this is abnormal and send in further medicine if needed X-ray suggestive of gas/constipation  Please continue to use simethicone 4 times daily Begin Colace twice daily MiraLAX 17 g daily mixed in juice  Please monitor for improvement of pain with more passing of gas/bowel movements If your symptoms persist despite passing gas and moving bowels more frequently I recommend to follow-up with primary care/OB/GYN for further evaluation/imaging of uterus/ovaries If symptoms significantly worsening please follow-up in the emergency room   ED Prescriptions    Medication Sig Dispense Auth. Provider   Simethicone 125 MG TABS Take 1 tablet (125 mg total) by mouth every 6 (six) hours as needed. 30 tablet Nakyah Erdmann C, PA-C   docusate sodium (COLACE) 100 MG capsule Take 1 capsule (100 mg total) by mouth every 12 (twelve) hours. 20 capsule Schon Zeiders C, PA-C   polyethylene glycol (MIRALAX / GLYCOLAX) 17 g packet Take 17 g by mouth daily. 14 each Jendayi Berling C, PA-C     PDMP not reviewed this encounter.   Janith Lima, PA-C 05/01/19 1507

## 2019-05-01 NOTE — Discharge Instructions (Signed)
Your urine did not show any signs of infection Vaginal swab results pending, we will call if this is abnormal and send in further medicine if needed X-ray suggestive of gas/constipation  Please continue to use simethicone 4 times daily Begin Colace twice daily MiraLAX 17 g daily mixed in juice  Please monitor for improvement of pain with more passing of gas/bowel movements If your symptoms persist despite passing gas and moving bowels more frequently I recommend to follow-up with primary care/OB/GYN for further evaluation/imaging of uterus/ovaries If symptoms significantly worsening please follow-up in the emergency room

## 2019-05-05 ENCOUNTER — Encounter: Payer: Self-pay | Admitting: Neurology

## 2019-05-05 ENCOUNTER — Ambulatory Visit: Payer: Medicare Other | Admitting: Neurology

## 2019-05-05 ENCOUNTER — Telehealth: Payer: Self-pay | Admitting: Neurology

## 2019-05-05 LAB — CERVICOVAGINAL ANCILLARY ONLY
Bacterial vaginitis: POSITIVE — AB
Candida vaginitis: NEGATIVE
Chlamydia: NEGATIVE
Neisseria Gonorrhea: NEGATIVE
Trichomonas: NEGATIVE

## 2019-05-05 NOTE — Telephone Encounter (Signed)
This patient did not show for a revisit appointment today. 

## 2019-05-06 ENCOUNTER — Telehealth (HOSPITAL_COMMUNITY): Payer: Self-pay

## 2019-05-06 MED ORDER — METRONIDAZOLE 500 MG PO TABS: 500.0000 mg | ORAL_TABLET | Freq: Two times a day (BID) | ORAL | 0 refills | Status: DC

## 2019-05-19 DIAGNOSIS — I10 Essential (primary) hypertension: Secondary | ICD-10-CM | POA: Diagnosis not present

## 2019-05-19 DIAGNOSIS — R7303 Prediabetes: Secondary | ICD-10-CM | POA: Diagnosis not present

## 2019-05-19 DIAGNOSIS — R102 Pelvic and perineal pain: Secondary | ICD-10-CM | POA: Diagnosis not present

## 2019-05-19 DIAGNOSIS — G4709 Other insomnia: Secondary | ICD-10-CM | POA: Diagnosis not present

## 2019-05-19 DIAGNOSIS — N3281 Overactive bladder: Secondary | ICD-10-CM | POA: Diagnosis not present

## 2019-05-19 DIAGNOSIS — Z720 Tobacco use: Secondary | ICD-10-CM | POA: Diagnosis not present

## 2019-05-19 DIAGNOSIS — F418 Other specified anxiety disorders: Secondary | ICD-10-CM | POA: Diagnosis not present

## 2019-05-19 DIAGNOSIS — G629 Polyneuropathy, unspecified: Secondary | ICD-10-CM | POA: Diagnosis not present

## 2019-05-19 DIAGNOSIS — E782 Mixed hyperlipidemia: Secondary | ICD-10-CM | POA: Diagnosis not present

## 2019-05-19 DIAGNOSIS — G44211 Episodic tension-type headache, intractable: Secondary | ICD-10-CM | POA: Diagnosis not present

## 2019-05-19 DIAGNOSIS — K219 Gastro-esophageal reflux disease without esophagitis: Secondary | ICD-10-CM | POA: Diagnosis not present

## 2019-05-26 ENCOUNTER — Encounter: Payer: Self-pay | Admitting: *Deleted

## 2019-07-08 ENCOUNTER — Encounter: Payer: Medicare Other | Admitting: Obstetrics and Gynecology

## 2019-07-08 DIAGNOSIS — H538 Other visual disturbances: Secondary | ICD-10-CM | POA: Diagnosis not present

## 2019-07-08 DIAGNOSIS — H2513 Age-related nuclear cataract, bilateral: Secondary | ICD-10-CM | POA: Diagnosis not present

## 2019-07-11 DIAGNOSIS — R05 Cough: Secondary | ICD-10-CM | POA: Diagnosis not present

## 2019-08-05 DIAGNOSIS — N3281 Overactive bladder: Secondary | ICD-10-CM | POA: Diagnosis not present

## 2019-08-05 DIAGNOSIS — F418 Other specified anxiety disorders: Secondary | ICD-10-CM | POA: Diagnosis not present

## 2019-08-05 DIAGNOSIS — R7303 Prediabetes: Secondary | ICD-10-CM | POA: Diagnosis not present

## 2019-08-05 DIAGNOSIS — G44211 Episodic tension-type headache, intractable: Secondary | ICD-10-CM | POA: Diagnosis not present

## 2019-08-05 DIAGNOSIS — K219 Gastro-esophageal reflux disease without esophagitis: Secondary | ICD-10-CM | POA: Diagnosis not present

## 2019-08-05 DIAGNOSIS — Z720 Tobacco use: Secondary | ICD-10-CM | POA: Diagnosis not present

## 2019-08-05 DIAGNOSIS — K5909 Other constipation: Secondary | ICD-10-CM | POA: Diagnosis not present

## 2019-08-05 DIAGNOSIS — I10 Essential (primary) hypertension: Secondary | ICD-10-CM | POA: Diagnosis not present

## 2019-08-05 DIAGNOSIS — G4709 Other insomnia: Secondary | ICD-10-CM | POA: Diagnosis not present

## 2019-08-05 DIAGNOSIS — G629 Polyneuropathy, unspecified: Secondary | ICD-10-CM | POA: Diagnosis not present

## 2019-08-05 DIAGNOSIS — E782 Mixed hyperlipidemia: Secondary | ICD-10-CM | POA: Diagnosis not present

## 2019-08-07 ENCOUNTER — Ambulatory Visit (HOSPITAL_COMMUNITY)
Admission: EM | Admit: 2019-08-07 | Discharge: 2019-08-07 | Disposition: A | Discharge status: Home / Self Care | Payer: Medicare Other | Attending: Internal Medicine | Admitting: Internal Medicine

## 2019-08-07 ENCOUNTER — Encounter (HOSPITAL_COMMUNITY): Payer: Self-pay

## 2019-08-07 ENCOUNTER — Other Ambulatory Visit: Payer: Self-pay

## 2019-08-07 DIAGNOSIS — R350 Frequency of micturition: Secondary | ICD-10-CM

## 2019-08-07 DIAGNOSIS — N390 Urinary tract infection, site not specified: Secondary | ICD-10-CM

## 2019-08-07 LAB — POCT URINALYSIS DIP (DEVICE)
Glucose, UA: NEGATIVE mg/dL
Nitrite: NEGATIVE
Protein, ur: >=300 mg/dL — AB
Specific Gravity, Urine: 1.025 (ref 1.005–1.030)
Urobilinogen, UA: 1 mg/dL (ref 0.0–1.0)
pH: 6 (ref 5.0–8.0)

## 2019-08-07 MED ORDER — CEPHALEXIN 500 MG PO CAPS: 500.0000 mg | ORAL_CAPSULE | Freq: Two times a day (BID) | ORAL | 0 refills | Status: AC

## 2019-08-07 NOTE — ED Provider Notes (Signed)
Laura Huerta    CSN: [id number] Arrival date & time: 08/07/19  1633      History   Chief Complaint Chief Complaint  Patient presents with  . Urinary Frequency    HPI Laura Huerta is a 54 y.o. female history of hypertension presenting today for evaluation of possible UTI.  Patient reports over the past day she has developed urinary frequency, urgency, incomplete voiding as well as some occasional dysuria.  Denies hematuria.  Denies vaginal symptoms of discharge itching or irritation.  Denies abdominal pain, nausea vomiting or fevers.  Does report prior UTIs, but no recent history.   HPI  Past Medical History:  Diagnosis Date  . Abnormality of gait 11/18/2014  . Acid reflux   . Anxiety   . Bronchitis   . Depression   . Hyperlipidemia   . Hypertension     Patient Active Problem List   Diagnosis Date Noted  . 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 Hypertension 03/23/2016  . Hyperlipidemia 03/23/2016  . Depression 03/23/2016  . Shingles 03/12/2015  . Left lower lobe pneumonia 02/04/2015  . Abnormality of gait 11/18/2014  . Rectal pain 09/18/2013  . Internal hemorrhoids 06/05/2013  . Hemorrhage of rectum and anus 04/22/2013  . Personal history of colonic polyps 04/22/2013  . Abnormal EKG 10/10/2012  . CA IN SITU, BREAST 11/30/2006  . TOBACCO ABUSE 11/30/2006  . COCAINE ABUSE 11/30/2006  . GERD (gastroesophageal reflux disease) 11/30/2006  . TENOSYNOVITIS, HAND/WRIST [place] 11/30/2006    Past Surgical History:  Procedure Laterality Date  . BREAST EXCISIONAL BIOPSY Left 11/02/2003  . CESAREAN SECTION    . HEMORRHOID BANDING  2015  . TUBAL LIGATION      OB History    Gravida  7   Para  2   Term  2   Preterm      AB  5   Living  2     SAB      TAB  5   Ectopic      Multiple      Live Births               Home Medications    Prior to Admission medications   Medication Sig  Start Date End Date Taking? Authorizing Provider  amLODipine (NORVASC) 5 MG tablet Take 5 mg by mouth daily. 04/21/19  Yes [provider]  atorvastatin (LIPITOR) 10 MG tablet Take 1 tablet (10 mg total) daily by mouth. For high Cholesterol 12/13/16  Yes Alfonse Spruce, FNP  buPROPion (WELLBUTRIN SR) 150 MG 12 hr tablet take 1 tablet by mouth twice a day 09/25/16  Yes Hairston, Mandesia R, FNP  gabapentin (NEURONTIN) 300 MG capsule Take 300 mg by mouth 2 (two) times daily. 04/21/19  Yes [provider]  hydrochlorothiazide (HYDRODIURIL) 12.5 MG tablet Take 12.5 mg by mouth daily. 04/21/19  Yes [provider]  oxybutynin (DITROPAN-XL) 10 MG 24 hr tablet Take 10 mg by mouth daily. 04/21/19  Yes [provider]  polyethylene glycol (MIRALAX / GLYCOLAX) 17 g packet Take 17 g by mouth daily. 05/01/19  Yes Reighlyn Elmes C, PA-C  Simethicone 125 MG TABS Take 1 tablet (125 mg total) by mouth every 6 (six) hours as needed. 05/01/19  Yes Caitlen Worth C, PA-C  cephALEXin (KEFLEX) 500 MG capsule Take 1 capsule (500 mg total) by mouth 2 (two) times daily for 5 days. 08/07/19 08/12/19  Equilla Que, Elesa Hacker, PA-C  docusate sodium (COLACE) 100 MG capsule Take 1 capsule (100 mg total) by mouth every 12 (twelve) hours. 05/01/19   Jameela Michna C, PA-C  lisinopril (PRINIVIL,ZESTRIL) 20 MG tablet Take 1 tablet (20 mg total) daily by mouth. For high blood pressure 12/13/16   Hairston, Toy Baker R, FNP  metroNIDAZOLE (FLAGYL) 500 MG tablet Take 1 tablet (500 mg total) by mouth 2 (two) times daily. 05/06/19   Chase Picket, MD  FLUoxetine (PROZAC) 20 MG capsule Take 1 capsule (20 mg total) by mouth daily. For depression 07/07/16 08/07/19  Lindell Spar I, NP  fluticasone (FLONASE) 50 MCG/ACT nasal spray Place 2 sprays into both nostrils daily as needed for allergies. For allergies. 07/06/16 05/01/19  Lindell Spar I, NP  mirtazapine (REMERON) 30 MG tablet TAKE 1 TABLET BY MOUTH AT BEDTIME 02/20/17  05/01/19  Alfonse Spruce, FNP  omeprazole (PRILOSEC) 40 MG capsule Take 40 mg by mouth every morning.  08/07/19  [provider]  traZODone (DESYREL) 50 MG tablet TAKE 1 TABLET BY MOUTH AT BEDTIME IF needed FOR SLEEP 01/17/17 08/07/19  Alfonse Spruce, FNP    Family History Family History  Problem Relation Age of Onset  . Hypertension Mother   . Heart failure Mother        Died age 73  . COPD Mother   . Asthma Mother   . Diabetes Mother   . Depression Mother   . Hypertension Father   . Diabetes Father   . Breast cancer Maternal Aunt     Social History Social History   Tobacco Use  . Smoking status: Current Every Day Smoker    Packs/day: 2.00    Years: 10.00    Pack years: 20.00    Types: Cigarettes  . Smokeless tobacco: Never Used  Vaping Use  . Vaping Use: Never used  Substance Use Topics  . Alcohol use: Yes    Alcohol/week: 1.0 standard drink    Types: 1 Cans of beer per week    Comment: 40 oz  . Drug use: No     Allergies   Patient has no known allergies.   Review of Systems Review of Systems  Constitutional: Negative for fever.  Respiratory: Negative for shortness of breath.   Cardiovascular: Negative for chest pain.  Gastrointestinal: Negative for abdominal pain, diarrhea, nausea and vomiting.  Genitourinary: Positive for dysuria, frequency and urgency. Negative for flank pain, genital sores, hematuria, menstrual problem, vaginal bleeding, vaginal discharge and vaginal pain.  Musculoskeletal: Negative for back pain.  Skin: Negative for rash.  Neurological: Negative for dizziness, light-headedness and headaches.     Physical Exam Triage Vital Signs ED Triage Vitals  Enc Vitals Group     BP 08/07/19 1641 113/80     Pulse Rate 08/07/19 1641 95     Resp 08/07/19 1641 16     Temp 08/07/19 1641 98 F (36.7 C)     Temp Source 08/07/19 1641 Oral     SpO2 08/07/19 1641 97 %     Weight --      Height --      Head Circumference --       Peak Flow --      Pain Score 08/07/19 1645 0     Pain Loc --      Pain Edu? --      Excl. in [place]? --    No data found.  Updated Vital Signs BP 113/80 (BP Location: Right Arm)   Pulse 95  Temp 98 F (36.7 C) (Oral)   Resp 16   LMP 05/11/2011   SpO2 97%   Visual Acuity Right Eye Distance:   Left Eye Distance:   Bilateral Distance:    Right Eye Near:   Left Eye Near:    Bilateral Near:     Physical Exam Vitals and nursing note reviewed.  Constitutional:      Appearance: She is well-developed.     Comments: No acute distress  HENT:     Head: Normocephalic and atraumatic.     Nose: Nose normal.  Eyes:     Conjunctiva/sclera: Conjunctivae normal.  Cardiovascular:     Rate and Rhythm: Normal rate.  Pulmonary:     Effort: Pulmonary effort is normal. No respiratory distress.  Abdominal:     General: There is no distension.  Musculoskeletal:        General: Normal range of motion.     Cervical back: Neck supple.  Skin:    General: Skin is warm and dry.  Neurological:     Mental Status: She is alert and oriented to person, place, and time.      UC Treatments / Results  Labs (all labs ordered are listed, but only abnormal results are displayed) Labs Reviewed  POCT URINALYSIS DIP (DEVICE) - Abnormal; Notable for the following components:      Result Value   Bilirubin Urine SMALL (*)    Ketones, ur TRACE (*)    Hgb urine dipstick MODERATE (*)    Protein, ur >=300 (*)    Leukocytes,Ua SMALL (*)    All other components within normal limits  URINE CULTURE    EKG   Radiology No results found.  Procedures Procedures (including critical care time)  Medications Ordered in UC Medications - No data to display  Initial Impression / Assessment and Plan / UC Course  I have reviewed the triage vital signs and the nursing notes.  Pertinent labs & imaging results that were available during my care of the patient were reviewed by me and considered in my medical  decision making (see chart for details).     UA suggestive of UTI, urine culture pending.  Initiated on Keflex twice daily x5 days.  Push fluids.  Vital signs stable, no concern for pyelonephritis at this time.  Discussed strict return precautions. Patient verbalized understanding and is agreeable with plan.  Final Clinical Impressions(s) / UC Diagnoses   Final diagnoses:  Lower urinary tract infectious disease     Discharge Instructions     Urine showed evidence of infection. We are treating you with keflex- twice daily for 5 days. Be sure to take full course. Stay hydrated- urine should be pale yellow to clear.   Please return or follow up with your primary provider if symptoms not improving with treatment. Please return sooner if you have worsening of symptoms or develop fever, nausea, vomiting, abdominal pain, back pain, lightheadedness, dizziness.   ED Prescriptions    Medication Sig Dispense Auth. Provider   cephALEXin (KEFLEX) 500 MG capsule Take 1 capsule (500 mg total) by mouth 2 (two) times daily for 5 days. 10 capsule Zyanna Leisinger, [place] C, PA-C     PDMP not reviewed this encounter.   Janith Lima, [place] 08/07/19 1720

## 2019-08-07 NOTE — Discharge Instructions (Signed)
Urine showed evidence of infection. We are treating you with keflex- twice daily for 5 days. Be sure to take full course. Stay hydrated- urine should be pale yellow to clear.   Please return or follow up with your primary provider if symptoms not improving with treatment. Please return sooner if you have worsening of symptoms or develop fever, nausea, vomiting, abdominal pain, back pain, lightheadedness, dizziness.  

## 2019-08-07 NOTE — ED Triage Notes (Signed)
Pt presents with urinary frequency x1 day. Pt also endorsing burring with urination. Pt denies abdominal pain, n/v/d, fevers, chill, or body aches. Pt denies discharge, or foul odor.

## 2019-08-10 LAB — URINE CULTURE: Culture: >=100000 — AB

## 2019-08-12 ENCOUNTER — Telehealth (HOSPITAL_COMMUNITY): Payer: Self-pay

## 2019-08-12 DIAGNOSIS — N390 Urinary tract infection, site not specified: Secondary | ICD-10-CM

## 2019-08-12 MED ORDER — NITROFURANTOIN MONOHYD MACRO 100 MG PO CAPS: 100.0000 mg | ORAL_CAPSULE | Freq: Two times a day (BID) | ORAL | 0 refills | Status: AC

## 2019-08-13 ENCOUNTER — Telehealth (HOSPITAL_COMMUNITY): Payer: Self-pay | Admitting: Emergency Medicine

## 2019-08-13 NOTE — Telephone Encounter (Signed)
Called patient to update her on culture results.  Verified identity using two identifiers.  Informed her of the need to switch antibiotics, and antibiotic was already sent to pharmacy.  All questions answered.  Patient verbalized understanding.

## 2019-09-08 ENCOUNTER — Other Ambulatory Visit: Payer: Self-pay

## 2019-09-08 ENCOUNTER — Encounter (HOSPITAL_COMMUNITY): Payer: Self-pay | Admitting: Emergency Medicine

## 2019-09-08 ENCOUNTER — Ambulatory Visit (HOSPITAL_COMMUNITY)
Admission: EM | Admit: 2019-09-08 | Discharge: 2019-09-08 | Disposition: A | Discharge status: Home / Self Care | Payer: Medicare Other | Attending: Family Medicine | Admitting: Family Medicine

## 2019-09-08 DIAGNOSIS — M5432 Sciatica, left side: Secondary | ICD-10-CM

## 2019-09-08 DIAGNOSIS — S39012A Strain of muscle, fascia and tendon of lower back, initial encounter: Secondary | ICD-10-CM

## 2019-09-08 MED ORDER — METHYLPREDNISOLONE 4 MG PO TBPK: ORAL_TABLET | ORAL | 0 refills | Status: AC

## 2019-09-08 MED ORDER — TRAMADOL-ACETAMINOPHEN 37.5-325 MG PO TABS: 1.0000 | ORAL_TABLET | Freq: Four times a day (QID) | ORAL | 0 refills | Status: AC | PRN

## 2019-09-08 MED ORDER — TIZANIDINE HCL 4 MG PO TABS: 4.0000 mg | ORAL_TABLET | Freq: Four times a day (QID) | ORAL | 0 refills | Status: AC | PRN

## 2019-09-08 NOTE — ED Triage Notes (Signed)
Patient started having back pain on Friday.  Thursday,  Patient slipped and caught herself prior to falling.  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    Pain is in lower back on left side and radiates into left leg.  Pain travels to left thigh

## 2019-09-08 NOTE — ED Provider Notes (Signed)
[place]    CSN: [id number] Arrival date & time: 09/08/19  1736      History   Chief Complaint Chief Complaint  Patient presents with  . Back Pain    HPI Laura Huerta is a 54 y.o. female.   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 Now has pain in her lower back that radiates down into the thigh.  Pain that radiates from the low back into the flank area.  States the muscle is tight and painful.  States the muscles swollen.  No numbness or weakness.  No bowel or bladder problems.  No prior back problems other than occasional sore back from lifting. Patient states that she is in good health.  Compliant with her medical care.  Past Medical History:  Diagnosis Date  . Abnormality of gait 11/18/2014  . Acid reflux   . Anxiety   . Bronchitis   . Depression   . Hyperlipidemia   . Hypertension     Patient Active Problem List   Diagnosis Date Noted  . MDD (major depressive disorder), recurrent severe, without psychosis ([place]) 07/04/2016  . Hypertension 03/23/2016  . Hyperlipidemia 03/23/2016  . Depression 03/23/2016  . Shingles 03/12/2015  . Left lower lobe pneumonia 02/04/2015  . Abnormality of gait 11/18/2014  . Rectal pain 09/18/2013  . Internal hemorrhoids 06/05/2013  . Hemorrhage of rectum and anus 04/22/2013  . Personal history of colonic polyps 04/22/2013  . Abnormal EKG 10/10/2012  . CA IN SITU, BREAST 11/30/2006  . TOBACCO ABUSE 11/30/2006  . COCAINE ABUSE 11/30/2006  . GERD (gastroesophageal reflux disease) 11/30/2006  . TENOSYNOVITIS, HAND/WRIST [place] 11/30/2006    Past Surgical History:  Procedure Laterality Date  . BREAST EXCISIONAL BIOPSY Left 11/02/2003  . CESAREAN SECTION    . HEMORRHOID BANDING  2015  . TUBAL LIGATION      OB History    Gravida  7    Para  2   Term  2   Preterm      AB  5   Living  2     SAB      TAB  5   Ectopic      Multiple      Live Births               Home Medications    Prior to Admission medications   Medication Sig Start Date End Date Taking? Authorizing Provider  amLODipine (NORVASC) 5 MG tablet Take 5 mg by mouth daily. 04/21/19   [provider]  atorvastatin (LIPITOR) 10 MG tablet Take 1 tablet (10 mg total) daily by mouth. For high Cholesterol 12/13/16   Alfonse Spruce, FNP  buPROPion (WELLBUTRIN SR) 150 MG 12 hr tablet take 1 tablet by mouth twice a day 09/25/16   Alfonse Spruce, FNP  docusate sodium (COLACE) 100 MG capsule Take 1 capsule (100 mg total) by mouth every 12 (twelve) hours. 05/01/19   Wieters, Hallie C, PA-C  gabapentin (NEURONTIN) 300 MG capsule Take 300 mg by mouth 2 (two) times daily. 04/21/19   [provider]  hydrochlorothiazide (HYDRODIURIL) 12.5 MG tablet Take 12.5 mg by mouth daily. 04/21/19   [provider]  lisinopril (PRINIVIL,ZESTRIL) 20 MG tablet Take 1 tablet (20 mg total) daily by mouth. For  high blood pressure 12/13/16   [name] R, FNP  methylPREDNISolone (MEDROL DOSEPAK) 4 MG TBPK tablet TAD 09/08/19   Raylene Everts, MD  oxybutynin (DITROPAN-XL) 10 MG 24 hr tablet Take 10 mg by mouth daily. 04/21/19   [provider]  polyethylene glycol (MIRALAX / GLYCOLAX) 17 g packet Take 17 g by mouth daily. 05/01/19   Wieters, Hallie C, PA-C  Simethicone 125 MG TABS Take 1 tablet (125 mg total) by mouth every 6 (six) hours as needed. 05/01/19   Wieters, Hallie C, PA-C  tiZANidine (ZANAFLEX) 4 MG tablet Take 1-2 tablets (4-8 mg total) by mouth every 6 (six) hours as needed for muscle spasms. 09/08/19   Raylene Everts, MD  traMADol-acetaminophen (ULTRACET) 37.5-325 MG tablet Take 1-2 tablets by mouth every 6 (six) hours as needed. 09/08/19   Raylene Everts, MD  FLUoxetine (PROZAC) 20 MG capsule Take 1 capsule  (20 mg total) by mouth daily. For depression 07/07/16 08/07/19  Lindell Spar I, NP  fluticasone (FLONASE) 50 MCG/ACT nasal spray Place 2 sprays into both nostrils daily as needed for allergies. For allergies. 07/06/16 05/01/19  Lindell Spar I, NP  mirtazapine (REMERON) 30 MG tablet TAKE 1 TABLET BY MOUTH AT BEDTIME 02/20/17 05/01/19  Alfonse Spruce, FNP  omeprazole (PRILOSEC) 40 MG capsule Take 40 mg by mouth every morning.  08/07/19  [provider]  traZODone (DESYREL) 50 MG tablet TAKE 1 TABLET BY MOUTH AT BEDTIME IF needed FOR SLEEP 01/17/17 08/07/19  Alfonse Spruce, FNP    Family History Family History  Problem Relation Age of Onset  . Hypertension Mother   . Heart failure Mother        Died age 39  . COPD Mother   . Asthma Mother   . Diabetes Mother   . Depression Mother   . Hypertension Father   . Diabetes Father   . Breast cancer Maternal Aunt     Social History Social History   Tobacco Use  . Smoking status: Current Every Day Smoker    Packs/day: 2.00    Years: 10.00    Pack years: 20.00    Types: Cigarettes  . Smokeless tobacco: Never Used  Vaping Use  . Vaping Use: Never used  Substance Use Topics  . Alcohol use: Yes    Alcohol/week: 1.0 standard drink    Types: 1 Cans of beer per week    Comment: 40 oz  . Drug use: No     Allergies   Patient has no known allergies.   Review of Systems Review of Systems See HPI  Physical Exam Triage Vital Signs ED Triage Vitals  Enc Vitals Group     BP 09/08/19 1932 114/87     Pulse Rate 09/08/19 1932 92     Resp 09/08/19 1932 18     Temp 09/08/19 1932 98.3 F (36.8 C)     Temp Source 09/08/19 1932 Oral     SpO2 09/08/19 1932 96 %     Weight --      Height --      Head Circumference --      Peak Flow --      Pain Score 09/08/19 1929 8     Pain Loc --      Pain Edu? --      Excl. in [place]? --    No data found.  Updated Vital Signs BP 114/87 (BP Location: Right Arm)   Pulse 92   Temp  98.3 F  (36.8 C) (Oral)   Resp 18   LMP 05/11/2011   SpO2 96%   Visual Acuity Right Eye Distance:   Left Eye Distance:   Bilateral Distance:    Right Eye Near:   Left Eye Near:    Bilateral Near:     Physical Exam Constitutional:      General: She is not in acute distress.    Appearance: She is well-developed.     Comments: Patient uncomfortable.  Guarded movements.  HENT:     Head: Normocephalic and atraumatic.     Mouth/Throat:     Comments: Mask in place Eyes:     Conjunctiva/sclera: Conjunctivae normal.     Pupils: Pupils are equal, round, and reactive to light.  Cardiovascular:     Rate and Rhythm: Normal rate.  Pulmonary:     Effort: Pulmonary effort is normal. No respiratory distress.  Abdominal:     General: There is no distension.     Palpations: Abdomen is soft.  Musculoskeletal:        General: Normal range of motion.     Cervical back: Normal range of motion.     Comments: Tender left lumbar column of muscles.  Tender left SI region.  Limited range of motion secondary to pain.  Strength sensation range of motion reflexes normal lower extremities.  Straight leg raise positive on the left at full extension for increased posterior thigh pain.  Skin:    General: Skin is warm and dry.  Neurological:     Mental Status: She is alert.     Gait: Gait abnormal.     Comments: Slow gait.  Guarded movements  Psychiatric:        Mood and Affect: Mood normal.        Behavior: Behavior normal.      UC Treatments / Results  Labs (all labs ordered are listed, but only abnormal results are displayed) Labs Reviewed - No data to display  EKG   Radiology No results found.  Procedures Procedures (including critical care time)  Medications Ordered in UC Medications - No data to display  Initial Impression / Assessment and Plan / UC Course  I have reviewed the triage vital signs and the nursing notes.  Pertinent labs & imaging results that were available during my  care of the patient were reviewed by me and considered in my medical decision making (see chart for details).     Reviewed conservative management of low back pain.  X-rays are not indicated for nontraumatic pain. Final Clinical Impressions(s) / UC Diagnoses   Final diagnoses:  Sciatica of left side  Strain of lumbar region, initial encounter     Discharge Instructions     Take the Medrol Dosepak as directed This will help with the inflammation and pain of the sciatic nerve Take muscle relaxer as needed Take pain medicine as needed Ice or heat to painful back muscles Return to activity as soon as you are able   ED Prescriptions    Medication Sig Dispense Auth. Provider   methylPREDNISolone (MEDROL DOSEPAK) 4 MG TBPK tablet TAD 21 tablet Raylene Everts, MD   tiZANidine (ZANAFLEX) 4 MG tablet Take 1-2 tablets (4-8 mg total) by mouth every 6 (six) hours as needed for muscle spasms. 21 tablet Raylene Everts, MD   traMADol-acetaminophen (ULTRACET) 37.5-325 MG tablet Take 1-2 tablets by mouth every 6 (six) hours as needed. 20 tablet Raylene Everts, MD  I have reviewed the PDMP during this encounter.   Raylene Everts, MD 09/08/19 2120

## 2019-09-08 NOTE — Discharge Instructions (Signed)
Take the Medrol Dosepak as directed This will help with the inflammation and pain of the sciatic nerve Take muscle relaxer as needed Take pain medicine as needed Ice or heat to painful back muscles Return to activity as soon as you are able

## 2019-09-22 DIAGNOSIS — Z23 Encounter for immunization: Secondary | ICD-10-CM | POA: Diagnosis not present

## 2019-10-10 DIAGNOSIS — F418 Other specified anxiety disorders: Secondary | ICD-10-CM | POA: Diagnosis not present

## 2019-10-10 DIAGNOSIS — E782 Mixed hyperlipidemia: Secondary | ICD-10-CM | POA: Diagnosis not present

## 2019-10-10 DIAGNOSIS — G4709 Other insomnia: Secondary | ICD-10-CM | POA: Diagnosis not present

## 2019-10-10 DIAGNOSIS — Z0001 Encounter for general adult medical examination with abnormal findings: Secondary | ICD-10-CM | POA: Diagnosis not present

## 2019-10-10 DIAGNOSIS — Z131 Encounter for screening for diabetes mellitus: Secondary | ICD-10-CM | POA: Diagnosis not present

## 2019-10-10 DIAGNOSIS — G44211 Episodic tension-type headache, intractable: Secondary | ICD-10-CM | POA: Diagnosis not present

## 2019-10-10 DIAGNOSIS — K219 Gastro-esophageal reflux disease without esophagitis: Secondary | ICD-10-CM | POA: Diagnosis not present

## 2019-10-10 DIAGNOSIS — N3281 Overactive bladder: Secondary | ICD-10-CM | POA: Diagnosis not present

## 2019-10-10 DIAGNOSIS — G629 Polyneuropathy, unspecified: Secondary | ICD-10-CM | POA: Diagnosis not present

## 2019-10-10 DIAGNOSIS — R7303 Prediabetes: Secondary | ICD-10-CM | POA: Diagnosis not present

## 2019-10-10 DIAGNOSIS — I10 Essential (primary) hypertension: Secondary | ICD-10-CM | POA: Diagnosis not present

## 2019-10-10 DIAGNOSIS — K5909 Other constipation: Secondary | ICD-10-CM | POA: Diagnosis not present

## 2019-10-23 DIAGNOSIS — Z23 Encounter for immunization: Secondary | ICD-10-CM | POA: Diagnosis not present

## 2019-11-26 DIAGNOSIS — R7303 Prediabetes: Secondary | ICD-10-CM | POA: Diagnosis not present

## 2019-11-26 DIAGNOSIS — E782 Mixed hyperlipidemia: Secondary | ICD-10-CM | POA: Diagnosis not present

## 2019-11-26 DIAGNOSIS — G4709 Other insomnia: Secondary | ICD-10-CM | POA: Diagnosis not present

## 2019-11-26 DIAGNOSIS — G44211 Episodic tension-type headache, intractable: Secondary | ICD-10-CM | POA: Diagnosis not present

## 2019-11-26 DIAGNOSIS — N3281 Overactive bladder: Secondary | ICD-10-CM | POA: Diagnosis not present

## 2019-11-26 DIAGNOSIS — I10 Essential (primary) hypertension: Secondary | ICD-10-CM | POA: Diagnosis not present

## 2019-11-26 DIAGNOSIS — K5909 Other constipation: Secondary | ICD-10-CM | POA: Diagnosis not present

## 2019-11-26 DIAGNOSIS — Z720 Tobacco use: Secondary | ICD-10-CM | POA: Diagnosis not present

## 2019-11-26 DIAGNOSIS — G629 Polyneuropathy, unspecified: Secondary | ICD-10-CM | POA: Diagnosis not present

## 2019-11-26 DIAGNOSIS — F418 Other specified anxiety disorders: Secondary | ICD-10-CM | POA: Diagnosis not present

## 2019-11-26 DIAGNOSIS — K219 Gastro-esophageal reflux disease without esophagitis: Secondary | ICD-10-CM | POA: Diagnosis not present

## 2019-12-16 IMAGING — MG DIGITAL SCREENING BILATERAL MAMMOGRAM WITH TOMO AND CAD
8 series · 8 of 24 positions shown · non-contrast
Comparison: Previous exam(s).

CLINICAL DATA: Screening.

EXAM:
DIGITAL SCREENING BILATERAL MAMMOGRAM WITH TOMO AND CAD

[R CC synth-2D]
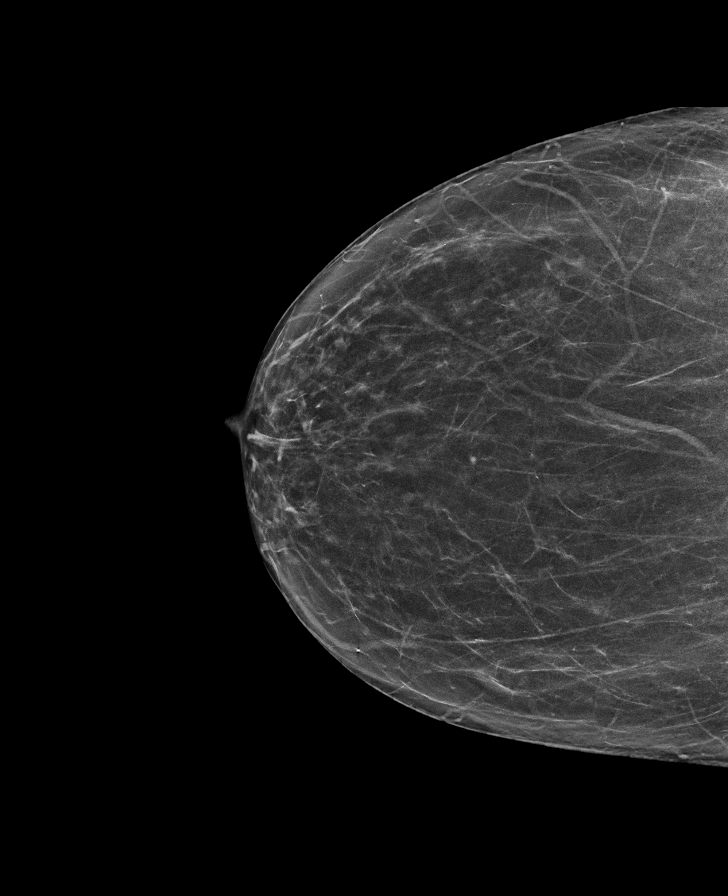

[L MLO synth-2D]
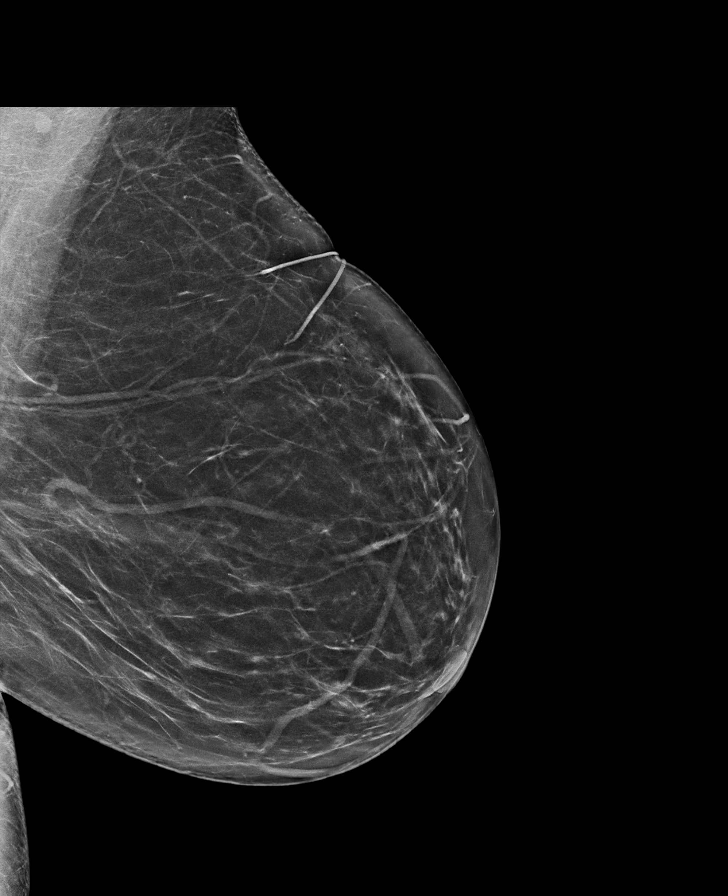

[R MLO synth-2D]
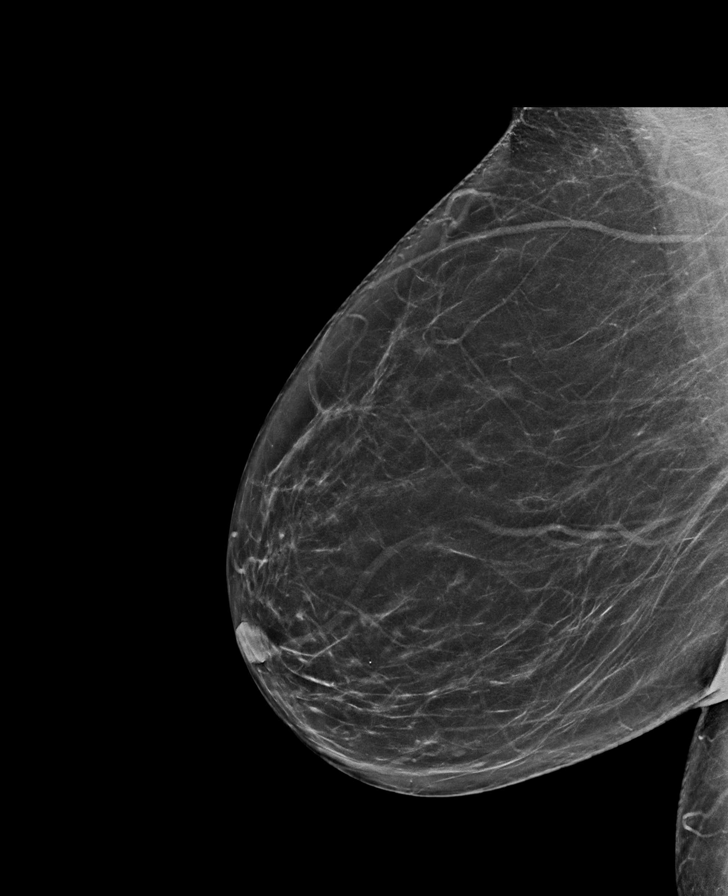

[L CC synth-2D]
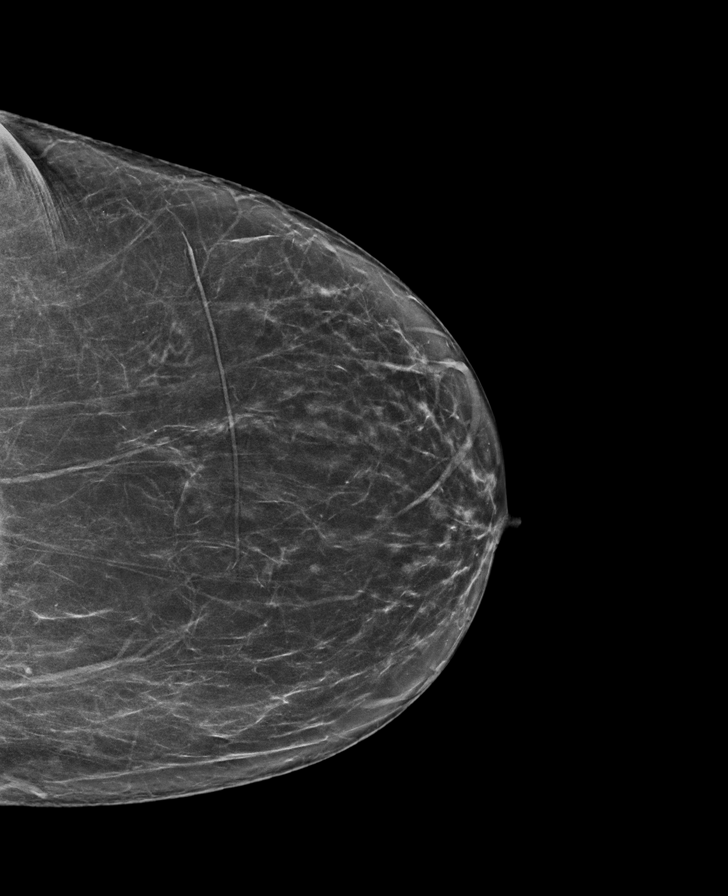

[L CC tomo · tomo slice 35/68.0]
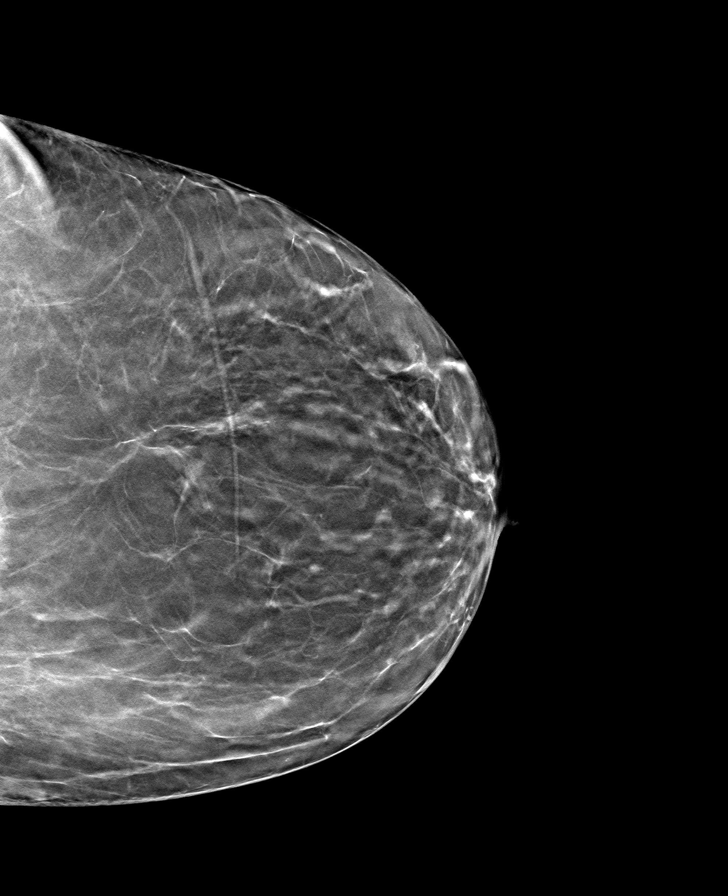

[R MLO tomo · tomo slice 36/71.0]
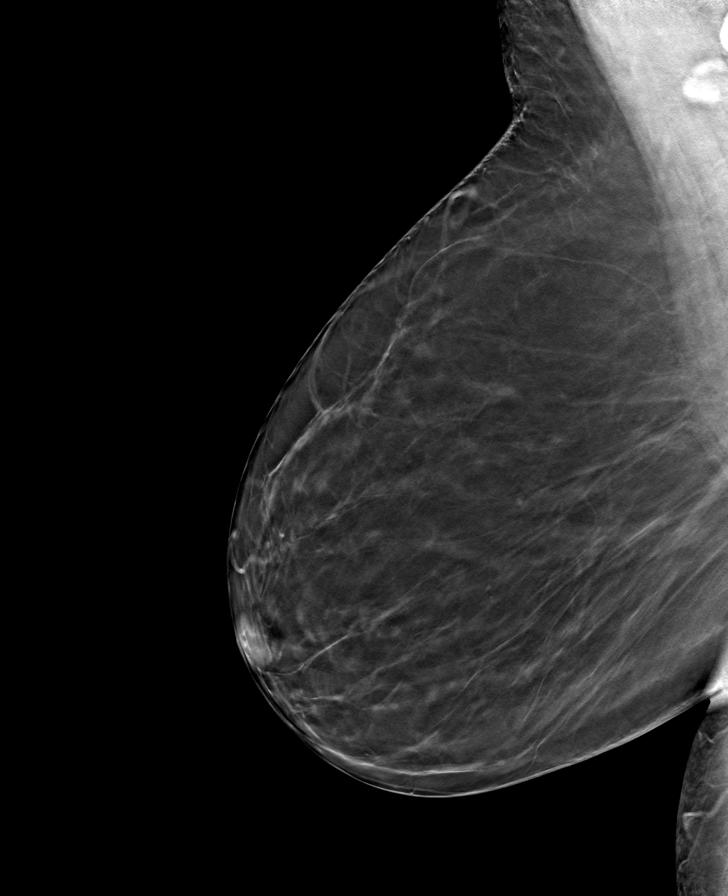

[R CC tomo · tomo slice 33/64.0]
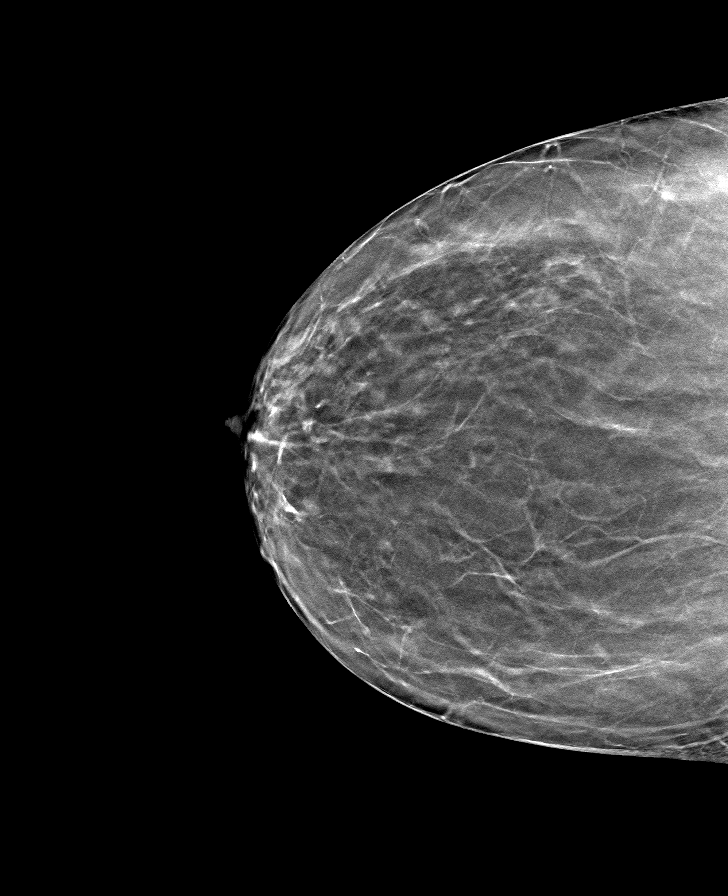

[L MLO tomo · tomo slice 37/72.0]
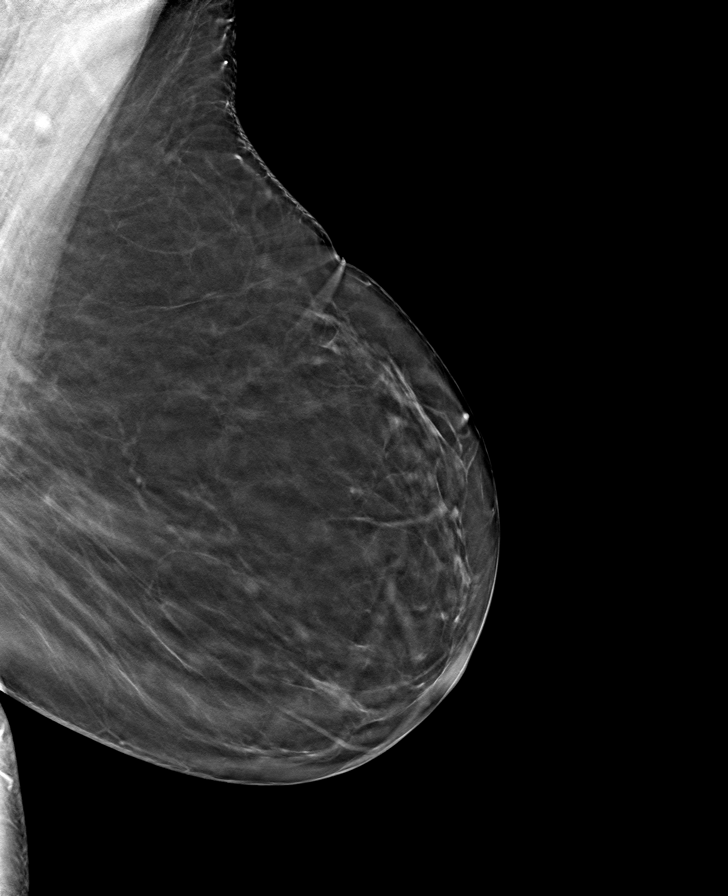

[8 of 24 positions shown; findings below may reference images not displayed]

ACR Breast Density Category b: There are scattered areas of
fibroglandular density.
FINDINGS: There are no findings suspicious for malignancy. Images were
processed with CAD.
IMPRESSION: No mammographic evidence of malignancy. A result letter of this
screening mammogram will be mailed directly to the patient.

RECOMMENDATION:
Screening mammogram in one year. (Code:CN-U-775)

BI-RADS CATEGORY  1: Negative.

## 2020-01-22 DIAGNOSIS — G44211 Episodic tension-type headache, intractable: Secondary | ICD-10-CM | POA: Diagnosis not present

## 2020-01-22 DIAGNOSIS — G4709 Other insomnia: Secondary | ICD-10-CM | POA: Diagnosis not present

## 2020-01-22 DIAGNOSIS — G629 Polyneuropathy, unspecified: Secondary | ICD-10-CM | POA: Diagnosis not present

## 2020-01-22 DIAGNOSIS — Z113 Encounter for screening for infections with a predominantly sexual mode of transmission: Secondary | ICD-10-CM | POA: Diagnosis not present

## 2020-01-22 DIAGNOSIS — K219 Gastro-esophageal reflux disease without esophagitis: Secondary | ICD-10-CM | POA: Diagnosis not present

## 2020-01-22 DIAGNOSIS — E782 Mixed hyperlipidemia: Secondary | ICD-10-CM | POA: Diagnosis not present

## 2020-01-22 DIAGNOSIS — N3281 Overactive bladder: Secondary | ICD-10-CM | POA: Diagnosis not present

## 2020-01-22 DIAGNOSIS — F418 Other specified anxiety disorders: Secondary | ICD-10-CM | POA: Diagnosis not present

## 2020-01-22 DIAGNOSIS — R7303 Prediabetes: Secondary | ICD-10-CM | POA: Diagnosis not present

## 2020-01-22 DIAGNOSIS — I10 Essential (primary) hypertension: Secondary | ICD-10-CM | POA: Diagnosis not present

## 2020-01-22 DIAGNOSIS — Z720 Tobacco use: Secondary | ICD-10-CM | POA: Diagnosis not present

## 2020-01-22 DIAGNOSIS — K5909 Other constipation: Secondary | ICD-10-CM | POA: Diagnosis not present

## 2020-05-17 DIAGNOSIS — I10 Essential (primary) hypertension: Secondary | ICD-10-CM | POA: Diagnosis not present

## 2020-05-17 DIAGNOSIS — K5909 Other constipation: Secondary | ICD-10-CM | POA: Diagnosis not present

## 2020-05-17 DIAGNOSIS — E782 Mixed hyperlipidemia: Secondary | ICD-10-CM | POA: Diagnosis not present

## 2020-05-17 DIAGNOSIS — K219 Gastro-esophageal reflux disease without esophagitis: Secondary | ICD-10-CM | POA: Diagnosis not present

## 2020-05-17 DIAGNOSIS — R2689 Other abnormalities of gait and mobility: Secondary | ICD-10-CM | POA: Diagnosis not present

## 2020-05-17 DIAGNOSIS — R7303 Prediabetes: Secondary | ICD-10-CM | POA: Diagnosis not present

## 2020-05-17 DIAGNOSIS — N3281 Overactive bladder: Secondary | ICD-10-CM | POA: Diagnosis not present

## 2020-05-17 DIAGNOSIS — F418 Other specified anxiety disorders: Secondary | ICD-10-CM | POA: Diagnosis not present

## 2020-05-17 DIAGNOSIS — G44211 Episodic tension-type headache, intractable: Secondary | ICD-10-CM | POA: Diagnosis not present

## 2020-05-17 DIAGNOSIS — Z720 Tobacco use: Secondary | ICD-10-CM | POA: Diagnosis not present

## 2020-05-17 DIAGNOSIS — G629 Polyneuropathy, unspecified: Secondary | ICD-10-CM | POA: Diagnosis not present

## 2020-05-17 DIAGNOSIS — G4709 Other insomnia: Secondary | ICD-10-CM | POA: Diagnosis not present

## 2020-07-08 DIAGNOSIS — H538 Other visual disturbances: Secondary | ICD-10-CM | POA: Diagnosis not present

## 2020-08-04 ENCOUNTER — Other Ambulatory Visit: Payer: Self-pay

## 2020-08-04 ENCOUNTER — Ambulatory Visit (HOSPITAL_COMMUNITY)
Admission: EM | Admit: 2020-08-04 | Discharge: 2020-08-04 | Disposition: A | Discharge status: Home / Self Care | Payer: Medicare Other | Attending: Family Medicine | Admitting: Family Medicine

## 2020-08-04 ENCOUNTER — Encounter (HOSPITAL_COMMUNITY): Payer: Self-pay

## 2020-08-04 DIAGNOSIS — B9689 Other specified bacterial agents as the cause of diseases classified elsewhere: Secondary | ICD-10-CM

## 2020-08-04 DIAGNOSIS — L089 Local infection of the skin and subcutaneous tissue, unspecified: Secondary | ICD-10-CM

## 2020-08-04 MED ORDER — DOXYCYCLINE HYCLATE 100 MG PO CAPS: 100.0000 mg | ORAL_CAPSULE | Freq: Two times a day (BID) | ORAL | 0 refills | Status: AC

## 2020-08-04 NOTE — ED Provider Notes (Signed)
[place]   [id number] 08/04/20 Arrival Time: [place]:  1. Localized bacterial skin infection    No signs of abscess requiring I&D.  Begin: Meds ordered this encounter  Medications   doxycycline (VIBRAMYCIN) 100 MG capsule    Sig: Take 1 capsule (100 mg total) by mouth 2 (two) times daily.    Dispense:  14 capsule    Refill:  0   Will follow up with PCP or here if worsening or failing to improve as anticipated. Reviewed expectations re: course of current medical issues. Questions answered. Outlined signs and symptoms indicating need for more acute intervention. Patient verbalized understanding. After Visit Summary given.   SUBJECTIVE:  Kamesha Herne is a 55 y.o. female who presents with a skin complaint. Questions insect bite; R post thigh. Approx 2-3 d ago. Reports increasing erythema and heat from area. Afebrile. Ambulatory without difficulty.   OBJECTIVE: Vitals:   08/04/20 1606  BP: 115/80  Pulse: 84  Resp: 18  Temp: 98.2 F (36.8 C)  TempSrc: Oral  SpO2: 94%    General appearance: alert; no distress HEENT: [place]; AT Neck: supple with FROM Lungs: clear to auscultation bilaterally Heart: regular rate and rhythm Extremities: no edema; moves all extremities normally Skin: warm and dry; R post thigh with approx 4 x 5 cm area of erythema present; hot to touch; no areas of fluctuance Psychological: alert and cooperative; normal mood and affect  No Known Allergies  Past Medical History:  Diagnosis Date   Abnormality of gait 11/18/2014   Acid reflux    Anxiety    Bronchitis    Depression    Hyperlipidemia    Hypertension    Social History   Socioeconomic History   Marital status: Single    Spouse name: Not on file   Number of children: 1   Years of education: 7   Highest education level: Not on file  Occupational History   Occupation: CNA    Employer: [employer]  Tobacco Use   Smoking status: Every Day     Packs/day: 2.00    Years: 10.00    Pack years: 20.00    Types: Cigarettes   Smokeless tobacco: Never  Vaping Use   Vaping Use: Never used  Substance and Sexual Activity   Alcohol use: Yes    Alcohol/week: 1.0 standard drink    Types: 1 Cans of beer per week    Comment: 40 oz   Drug use: No   Sexual activity: Yes    Birth control/protection: Surgical  Other Topics Concern   Not on file  Social History Narrative   Lives with son.     Patient drinks about 3 glasses of tea daily.   Patient is right handed.    Social Determinants of Health   Financial Resource Strain: Not on file  Food Insecurity: Not on file  Transportation Needs: Not on file  Physical Activity: Not on file  Stress: Not on file  Social Connections: Not on file  Intimate Partner Violence: Not on file   Family History  Problem Relation Age of Onset   Hypertension Mother    Heart failure Mother        Died age 43   COPD Mother    Asthma Mother    Diabetes Mother    Depression Mother    Hypertension Father    Diabetes Father    Breast cancer Maternal Aunt    Past Surgical History:  Procedure  Laterality Date   BREAST EXCISIONAL BIOPSY Left 11/02/2003   CESAREAN SECTION     HEMORRHOID BANDING  2015   TUBAL LIGATION        Vanessa Kick, MD 08/04/20 [phone]

## 2020-08-04 NOTE — ED Triage Notes (Signed)
Pt presents with insect bite that has become inflammed X 2 days.

## 2020-09-09 DIAGNOSIS — G4709 Other insomnia: Secondary | ICD-10-CM | POA: Diagnosis not present

## 2020-09-09 DIAGNOSIS — K5909 Other constipation: Secondary | ICD-10-CM | POA: Diagnosis not present

## 2020-09-09 DIAGNOSIS — E782 Mixed hyperlipidemia: Secondary | ICD-10-CM | POA: Diagnosis not present

## 2020-09-09 DIAGNOSIS — Z720 Tobacco use: Secondary | ICD-10-CM | POA: Diagnosis not present

## 2020-09-09 DIAGNOSIS — N3281 Overactive bladder: Secondary | ICD-10-CM | POA: Diagnosis not present

## 2020-09-09 DIAGNOSIS — F418 Other specified anxiety disorders: Secondary | ICD-10-CM | POA: Diagnosis not present

## 2020-09-09 DIAGNOSIS — K219 Gastro-esophageal reflux disease without esophagitis: Secondary | ICD-10-CM | POA: Diagnosis not present

## 2020-09-09 DIAGNOSIS — G629 Polyneuropathy, unspecified: Secondary | ICD-10-CM | POA: Diagnosis not present

## 2020-09-09 DIAGNOSIS — G44211 Episodic tension-type headache, intractable: Secondary | ICD-10-CM | POA: Diagnosis not present

## 2020-09-09 DIAGNOSIS — I10 Essential (primary) hypertension: Secondary | ICD-10-CM | POA: Diagnosis not present

## 2020-09-09 DIAGNOSIS — J22 Unspecified acute lower respiratory infection: Secondary | ICD-10-CM | POA: Diagnosis not present

## 2020-09-09 DIAGNOSIS — R7303 Prediabetes: Secondary | ICD-10-CM | POA: Diagnosis not present

## 2020-09-11 DIAGNOSIS — T161XXA Foreign body in right ear, initial encounter: Secondary | ICD-10-CM | POA: Diagnosis not present

## 2020-10-27 DIAGNOSIS — F418 Other specified anxiety disorders: Secondary | ICD-10-CM | POA: Diagnosis not present

## 2020-10-27 DIAGNOSIS — I10 Essential (primary) hypertension: Secondary | ICD-10-CM | POA: Diagnosis not present

## 2020-10-27 DIAGNOSIS — G44211 Episodic tension-type headache, intractable: Secondary | ICD-10-CM | POA: Diagnosis not present

## 2020-10-27 DIAGNOSIS — K219 Gastro-esophageal reflux disease without esophagitis: Secondary | ICD-10-CM | POA: Diagnosis not present

## 2020-10-27 DIAGNOSIS — N3281 Overactive bladder: Secondary | ICD-10-CM | POA: Diagnosis not present

## 2020-10-27 DIAGNOSIS — G629 Polyneuropathy, unspecified: Secondary | ICD-10-CM | POA: Diagnosis not present

## 2020-10-27 DIAGNOSIS — K5909 Other constipation: Secondary | ICD-10-CM | POA: Diagnosis not present

## 2020-10-27 DIAGNOSIS — R7303 Prediabetes: Secondary | ICD-10-CM | POA: Diagnosis not present

## 2020-10-27 DIAGNOSIS — G4709 Other insomnia: Secondary | ICD-10-CM | POA: Diagnosis not present

## 2020-10-27 DIAGNOSIS — Z0001 Encounter for general adult medical examination with abnormal findings: Secondary | ICD-10-CM | POA: Diagnosis not present

## 2020-10-27 DIAGNOSIS — R0602 Shortness of breath: Secondary | ICD-10-CM | POA: Diagnosis not present

## 2020-10-27 DIAGNOSIS — E782 Mixed hyperlipidemia: Secondary | ICD-10-CM | POA: Diagnosis not present

## 2020-11-24 ENCOUNTER — Other Ambulatory Visit: Payer: Self-pay | Admitting: Internal Medicine

## 2020-11-24 DIAGNOSIS — Z1231 Encounter for screening mammogram for malignant neoplasm of breast: Secondary | ICD-10-CM

## 2020-12-22 ENCOUNTER — Ambulatory Visit: Payer: Medicare Other

## 2021-07-13 ENCOUNTER — Ambulatory Visit: Payer: Medicare Other | Admitting: Neurology

## 2021-07-13 ENCOUNTER — Encounter: Payer: Self-pay | Admitting: Neurology

## 2021-07-13 NOTE — Progress Notes (Deleted)
[facility]    Provider:  Dr Jaynee Eagles Requesting Provider: Roselee Nova, MD Primary Care Provider:  Benito Mccreedy, MD  CC:  ***  HPI:  Laura Huerta is a 56 y.o. female here as requested by Roselee Nova, MD for ***.  ***  Reviewed notes, labs and imaging from outside physicians, which showed ***  Review of Systems: Patient complains of symptoms per HPI as well as the following symptoms ***. Pertinent negatives and positives per HPI. All others negative.   Social History   Socioeconomic History   Marital status: Single    Spouse name: Not on file   Number of children: 1   Years of education: 7   Highest education level: Not on file  Occupational History   Occupation: CNA    Employer: [employer]  Tobacco Use   Smoking status: Every Day    Packs/day: 2.00    Years: 10.00    Pack years: 20.00    Types: Cigarettes   Smokeless tobacco: Never  Vaping Use   Vaping Use: Never used  Substance and Sexual Activity   Alcohol use: Yes    Alcohol/week: 1.0 standard drink    Types: 1 Cans of beer per week    Comment: 40 oz   Drug use: No   Sexual activity: Yes    Birth control/protection: Surgical  Other Topics Concern   Not on file  Social History Narrative   Lives with son.     Patient drinks about 3 glasses of tea daily.   Patient is right handed.    Social Determinants of Health   Financial Resource Strain: Not on file  Food Insecurity: Not on file  Transportation Needs: Not on file  Physical Activity: Not on file  Stress: Not on file  Social Connections: Not on file  Intimate Partner Violence: Not on file    Family History  Problem Relation Age of Onset   Hypertension Mother    Heart failure Mother        Died age 14   COPD Mother    Asthma Mother    Diabetes Mother    Depression Mother    Hypertension Father    Diabetes Father    Breast cancer Maternal Aunt     Past Medical History:  Diagnosis Date    Abnormality of gait 11/18/2014   Acid reflux    Anxiety    Bronchitis    Depression    Hyperlipidemia    Hypertension     Patient Active Problem List   Diagnosis Date Noted   MDD (major depressive disorder), recurrent severe, without psychosis ([place]) 07/04/2016   Hypertension 03/23/2016   Hyperlipidemia 03/23/2016   Depression 03/23/2016   Shingles 03/12/2015   Left lower lobe pneumonia 02/04/2015   Abnormality of gait 11/18/2014   Rectal pain 09/18/2013   Internal hemorrhoids 06/05/2013   Hemorrhage of rectum and anus 04/22/2013   Personal history of colonic polyps 04/22/2013   Abnormal EKG 10/10/2012   CA IN SITU, BREAST 11/30/2006   TOBACCO ABUSE 11/30/2006   COCAINE ABUSE 11/30/2006   GERD (gastroesophageal reflux disease) 11/30/2006   TENOSYNOVITIS, HAND/WRIST [place] 11/30/2006    Past Surgical History:  Procedure Laterality Date   BREAST EXCISIONAL BIOPSY Left 11/02/2003   CESAREAN SECTION     HEMORRHOID BANDING  2015   TUBAL LIGATION      Current Outpatient Medications  Medication Sig Dispense Refill   amLODipine (NORVASC) 5 MG tablet  Take 5 mg by mouth daily.     atorvastatin (LIPITOR) 10 MG tablet Take 1 tablet (10 mg total) daily by mouth. For high Cholesterol 90 tablet 3   buPROPion (WELLBUTRIN SR) 150 MG 12 hr tablet take 1 tablet by mouth twice a day 60 tablet 2   docusate sodium (COLACE) 100 MG capsule Take 1 capsule (100 mg total) by mouth every 12 (twelve) hours. 20 capsule 0   doxycycline (VIBRAMYCIN) 100 MG capsule Take 1 capsule (100 mg total) by mouth 2 (two) times daily. 14 capsule 0   gabapentin (NEURONTIN) 300 MG capsule Take 300 mg by mouth 2 (two) times daily.     hydrochlorothiazide (HYDRODIURIL) 12.5 MG tablet Take 12.5 mg by mouth daily.     lisinopril (PRINIVIL,ZESTRIL) 20 MG tablet Take 1 tablet (20 mg total) daily by mouth. For high blood pressure 90 tablet 3   methylPREDNISolone (MEDROL DOSEPAK) 4 MG TBPK tablet TAD 21 tablet 0   oxybutynin  (DITROPAN-XL) 10 MG 24 hr tablet Take 10 mg by mouth daily.     polyethylene glycol (MIRALAX / GLYCOLAX) 17 g packet Take 17 g by mouth daily. 14 each 0   Simethicone 125 MG TABS Take 1 tablet (125 mg total) by mouth every 6 (six) hours as needed. 30 tablet 0   tiZANidine (ZANAFLEX) 4 MG tablet Take 1-2 tablets (4-8 mg total) by mouth every 6 (six) hours as needed for muscle spasms. 21 tablet 0   traMADol-acetaminophen (ULTRACET) 37.5-325 MG tablet Take 1-2 tablets by mouth every 6 (six) hours as needed. 20 tablet 0   No current facility-administered medications for this visit.    Allergies as of 07/13/2021   (No Known Allergies)    Vitals: LMP 05/11/2011  Last Weight:  Wt Readings from Last 1 Encounters:  05/01/19 160 lb (72.6 kg)   Last Height:   Ht Readings from Last 1 Encounters:  05/01/19 '5\' 3"'$  (1.6 m)     Physical exam: Exam: Gen: NAD, conversant, well nourised, obese, well groomed                     CV: RRR, no MRG. No Carotid Bruits. No peripheral edema, warm, nontender Eyes: Conjunctivae clear without exudates or hemorrhage  Neuro: Detailed Neurologic Exam  Speech:    Speech is normal; fluent and spontaneous with normal comprehension.  Cognition:    The patient is oriented to person, place, and time;     recent and remote memory intact;     language fluent;     normal attention, concentration,     fund of knowledge Cranial Nerves:    The pupils are equal, round, and reactive to light. The fundi are normal and spontaneous venous pulsations are present. Visual fields are full to finger confrontation. Extraocular movements are intact. Trigeminal sensation is intact and the muscles of mastication are normal. The face is symmetric. The palate elevates in the midline. Hearing intact. Voice is normal. Shoulder shrug is normal. The tongue has normal motion without fasciculations.   Coordination:    Normal finger to nose and heel to shin. Normal rapid alternating  movements.   Gait:    Heel-toe and tandem gait are normal.   Motor Observation:    No asymmetry, no atrophy, and no involuntary movements noted. Tone:    Normal muscle tone.    Posture:    Posture is normal. normal erect    Strength:    Strength is V/V in the  upper and lower limbs.      Sensation: intact to LT     Reflex Exam:  DTR's:    Deep tendon reflexes in the upper and lower extremities are normal bilaterally.   Toes:    The toes are downgoing bilaterally.   Clonus:    Clonus is absent.    Assessment/Plan:    No orders of the defined types were placed in this encounter.  No orders of the defined types were placed in this encounter.   Cc: Roselee Nova, MD,  Benito Mccreedy, MD  Sarina Ill, MD  [facility] [address] [place], [place] [address]  Phone [phone] Fax [phone]
